# Patient Record
Sex: Female | Born: 1975 | Race: White | Hispanic: No | Smoking: Current every day smoker
Health system: Southern US, Community
[De-identification: ages and names within clinical notes are randomized; demographics above are authoritative.]

## PROBLEM LIST (undated history)

## (undated) DIAGNOSIS — M549 Dorsalgia, unspecified: Secondary | ICD-10-CM

## (undated) DIAGNOSIS — F329 Major depressive disorder, single episode, unspecified: Secondary | ICD-10-CM

## (undated) DIAGNOSIS — G8929 Other chronic pain: Secondary | ICD-10-CM

## (undated) DIAGNOSIS — R519 Headache, unspecified: Secondary | ICD-10-CM

## (undated) DIAGNOSIS — K08109 Complete loss of teeth, unspecified cause, unspecified class: Secondary | ICD-10-CM

## (undated) DIAGNOSIS — Z972 Presence of dental prosthetic device (complete) (partial): Secondary | ICD-10-CM

## (undated) DIAGNOSIS — Z8489 Family history of other specified conditions: Secondary | ICD-10-CM

## (undated) DIAGNOSIS — D649 Anemia, unspecified: Secondary | ICD-10-CM

## (undated) DIAGNOSIS — J449 Chronic obstructive pulmonary disease, unspecified: Secondary | ICD-10-CM

## (undated) DIAGNOSIS — Z9889 Other specified postprocedural states: Secondary | ICD-10-CM

## (undated) DIAGNOSIS — F411 Generalized anxiety disorder: Secondary | ICD-10-CM

## (undated) DIAGNOSIS — F988 Other specified behavioral and emotional disorders with onset usually occurring in childhood and adolescence: Secondary | ICD-10-CM

## (undated) DIAGNOSIS — R112 Nausea with vomiting, unspecified: Secondary | ICD-10-CM

## (undated) DIAGNOSIS — F419 Anxiety disorder, unspecified: Secondary | ICD-10-CM

## (undated) DIAGNOSIS — R51 Headache: Secondary | ICD-10-CM

## (undated) HISTORY — DX: Headache: R51

## (undated) HISTORY — PX: ABDOMINAL HYSTERECTOMY: SHX81

## (undated) HISTORY — DX: Major depressive disorder, single episode, unspecified: F32.9

## (undated) HISTORY — DX: Other chronic pain: G89.29

## (undated) HISTORY — PX: LAPAROSCOPIC ASSISTED VAGINAL HYSTERECTOMY: SHX5398

## (undated) HISTORY — DX: Anxiety disorder, unspecified: F41.9

## (undated) HISTORY — DX: Dorsalgia, unspecified: M54.9

## (undated) HISTORY — DX: Other specified behavioral and emotional disorders with onset usually occurring in childhood and adolescence: F98.8

## (undated) HISTORY — DX: Anemia, unspecified: D64.9

## (undated) HISTORY — DX: Headache, unspecified: R51.9

---

## 1998-04-26 ENCOUNTER — Emergency Department (HOSPITAL_COMMUNITY): Admission: EM | Admit: 1998-04-26 | Discharge: 1998-04-26 | Payer: Self-pay | Admitting: Emergency Medicine

## 2000-06-23 ENCOUNTER — Emergency Department (HOSPITAL_COMMUNITY): Admission: EM | Admit: 2000-06-23 | Discharge: 2000-06-23 | Payer: Self-pay | Admitting: Internal Medicine

## 2000-08-22 ENCOUNTER — Emergency Department (HOSPITAL_COMMUNITY): Admission: EM | Admit: 2000-08-22 | Discharge: 2000-08-22 | Payer: Self-pay | Admitting: Emergency Medicine

## 2000-09-16 ENCOUNTER — Emergency Department (HOSPITAL_COMMUNITY): Admission: EM | Admit: 2000-09-16 | Discharge: 2000-09-16 | Payer: Self-pay | Admitting: Emergency Medicine

## 2000-10-13 ENCOUNTER — Observation Stay (HOSPITAL_COMMUNITY): Admission: AD | Admit: 2000-10-13 | Discharge: 2000-10-13 | Payer: Self-pay | Admitting: Obstetrics and Gynecology

## 2000-10-22 ENCOUNTER — Encounter: Payer: Self-pay | Admitting: Obstetrics and Gynecology

## 2000-10-22 ENCOUNTER — Inpatient Hospital Stay (HOSPITAL_COMMUNITY): Admission: AD | Admit: 2000-10-22 | Discharge: 2000-10-22 | Payer: Self-pay | Admitting: Obstetrics and Gynecology

## 2000-12-01 ENCOUNTER — Other Ambulatory Visit: Admission: RE | Admit: 2000-12-01 | Discharge: 2000-12-01 | Payer: Self-pay | Admitting: Obstetrics and Gynecology

## 2000-12-26 ENCOUNTER — Encounter: Payer: Self-pay | Admitting: Obstetrics and Gynecology

## 2000-12-26 ENCOUNTER — Ambulatory Visit (HOSPITAL_COMMUNITY): Admission: RE | Admit: 2000-12-26 | Discharge: 2000-12-26 | Payer: Self-pay | Admitting: Obstetrics and Gynecology

## 2001-01-15 ENCOUNTER — Inpatient Hospital Stay (HOSPITAL_COMMUNITY): Admission: AD | Admit: 2001-01-15 | Discharge: 2001-01-15 | Payer: Self-pay | Admitting: Obstetrics and Gynecology

## 2001-02-19 ENCOUNTER — Ambulatory Visit (HOSPITAL_COMMUNITY): Admission: RE | Admit: 2001-02-19 | Discharge: 2001-02-19 | Payer: Self-pay | Admitting: Obstetrics and Gynecology

## 2001-03-02 ENCOUNTER — Inpatient Hospital Stay (HOSPITAL_COMMUNITY): Admission: AD | Admit: 2001-03-02 | Discharge: 2001-03-02 | Payer: Self-pay | Admitting: Obstetrics and Gynecology

## 2001-03-08 ENCOUNTER — Inpatient Hospital Stay (HOSPITAL_COMMUNITY): Admission: AD | Admit: 2001-03-08 | Discharge: 2001-03-08 | Payer: Self-pay | Admitting: Obstetrics and Gynecology

## 2001-04-02 ENCOUNTER — Inpatient Hospital Stay (HOSPITAL_COMMUNITY): Admission: AD | Admit: 2001-04-02 | Discharge: 2001-04-02 | Payer: Self-pay | Admitting: Obstetrics and Gynecology

## 2001-04-09 ENCOUNTER — Inpatient Hospital Stay (HOSPITAL_COMMUNITY): Admission: AD | Admit: 2001-04-09 | Discharge: 2001-04-09 | Payer: Self-pay | Admitting: Obstetrics and Gynecology

## 2001-04-21 ENCOUNTER — Inpatient Hospital Stay (HOSPITAL_COMMUNITY): Admission: AD | Admit: 2001-04-21 | Discharge: 2001-04-21 | Payer: Self-pay | Admitting: Obstetrics and Gynecology

## 2001-05-01 ENCOUNTER — Inpatient Hospital Stay (HOSPITAL_COMMUNITY): Admission: AD | Admit: 2001-05-01 | Discharge: 2001-05-01 | Payer: Self-pay | Admitting: Obstetrics and Gynecology

## 2001-05-03 ENCOUNTER — Inpatient Hospital Stay (HOSPITAL_COMMUNITY): Admission: AD | Admit: 2001-05-03 | Discharge: 2001-05-05 | Payer: Self-pay | Admitting: Obstetrics and Gynecology

## 2001-12-07 ENCOUNTER — Other Ambulatory Visit: Admission: RE | Admit: 2001-12-07 | Discharge: 2001-12-07 | Payer: Self-pay | Admitting: Obstetrics and Gynecology

## 2002-05-05 ENCOUNTER — Ambulatory Visit (HOSPITAL_COMMUNITY): Admission: RE | Admit: 2002-05-05 | Discharge: 2002-05-05 | Payer: Self-pay | Admitting: Obstetrics and Gynecology

## 2002-05-05 ENCOUNTER — Encounter: Payer: Self-pay | Admitting: Obstetrics and Gynecology

## 2002-06-23 ENCOUNTER — Encounter: Payer: Self-pay | Admitting: *Deleted

## 2002-06-23 ENCOUNTER — Encounter: Admission: RE | Admit: 2002-06-23 | Discharge: 2002-06-23 | Payer: Self-pay | Admitting: *Deleted

## 2002-10-14 HISTORY — PX: HEMORRHOID SURGERY: SHX153

## 2002-12-23 ENCOUNTER — Other Ambulatory Visit: Admission: RE | Admit: 2002-12-23 | Discharge: 2002-12-23 | Payer: Self-pay | Admitting: Obstetrics and Gynecology

## 2003-05-10 ENCOUNTER — Emergency Department (HOSPITAL_COMMUNITY): Admission: EM | Admit: 2003-05-10 | Discharge: 2003-05-10 | Payer: Self-pay | Admitting: Emergency Medicine

## 2003-05-10 ENCOUNTER — Encounter: Payer: Self-pay | Admitting: Emergency Medicine

## 2003-05-24 ENCOUNTER — Ambulatory Visit (HOSPITAL_COMMUNITY): Admission: RE | Admit: 2003-05-24 | Discharge: 2003-05-24 | Payer: Self-pay | Admitting: Gastroenterology

## 2003-05-24 ENCOUNTER — Encounter (INDEPENDENT_AMBULATORY_CARE_PROVIDER_SITE_OTHER): Payer: Self-pay | Admitting: *Deleted

## 2003-10-15 HISTORY — PX: KNEE ARTHROSCOPY W/ MENISCECTOMY: SHX1879

## 2004-02-15 ENCOUNTER — Other Ambulatory Visit: Admission: RE | Admit: 2004-02-15 | Discharge: 2004-02-15 | Payer: Self-pay | Admitting: Obstetrics and Gynecology

## 2004-06-12 ENCOUNTER — Encounter: Admission: RE | Admit: 2004-06-12 | Discharge: 2004-06-12 | Payer: Self-pay | Admitting: Family Medicine

## 2004-06-22 ENCOUNTER — Inpatient Hospital Stay (HOSPITAL_COMMUNITY): Admission: AD | Admit: 2004-06-22 | Discharge: 2004-06-22 | Payer: Self-pay | Admitting: Obstetrics & Gynecology

## 2004-08-20 ENCOUNTER — Ambulatory Visit: Payer: Self-pay | Admitting: Family Medicine

## 2004-08-23 ENCOUNTER — Ambulatory Visit: Payer: Self-pay | Admitting: Internal Medicine

## 2004-09-11 ENCOUNTER — Ambulatory Visit: Payer: Self-pay | Admitting: Family Medicine

## 2004-12-28 ENCOUNTER — Encounter: Admission: RE | Admit: 2004-12-28 | Discharge: 2004-12-28 | Payer: Self-pay | Admitting: Orthopedic Surgery

## 2004-12-30 ENCOUNTER — Ambulatory Visit (HOSPITAL_COMMUNITY): Admission: RE | Admit: 2004-12-30 | Discharge: 2004-12-30 | Payer: Self-pay | Admitting: Radiology

## 2005-01-29 ENCOUNTER — Ambulatory Visit: Payer: Self-pay | Admitting: Family Medicine

## 2005-07-16 ENCOUNTER — Ambulatory Visit: Payer: Self-pay | Admitting: Family Medicine

## 2005-08-19 ENCOUNTER — Ambulatory Visit: Payer: Self-pay | Admitting: Family Medicine

## 2005-08-22 ENCOUNTER — Emergency Department (HOSPITAL_COMMUNITY): Admission: EM | Admit: 2005-08-22 | Discharge: 2005-08-22 | Payer: Self-pay | Admitting: Emergency Medicine

## 2005-09-02 ENCOUNTER — Emergency Department (HOSPITAL_COMMUNITY): Admission: EM | Admit: 2005-09-02 | Discharge: 2005-09-02 | Payer: Self-pay | Admitting: Emergency Medicine

## 2005-11-22 ENCOUNTER — Ambulatory Visit: Payer: Self-pay | Admitting: Internal Medicine

## 2005-12-10 ENCOUNTER — Other Ambulatory Visit: Admission: RE | Admit: 2005-12-10 | Discharge: 2005-12-10 | Payer: Self-pay | Admitting: Obstetrics and Gynecology

## 2006-01-16 ENCOUNTER — Ambulatory Visit: Payer: Self-pay | Admitting: Family Medicine

## 2006-02-27 ENCOUNTER — Ambulatory Visit (HOSPITAL_COMMUNITY): Admission: RE | Admit: 2006-02-27 | Discharge: 2006-02-27 | Payer: Self-pay | Admitting: Surgery

## 2006-02-27 ENCOUNTER — Encounter (INDEPENDENT_AMBULATORY_CARE_PROVIDER_SITE_OTHER): Payer: Self-pay | Admitting: Specialist

## 2006-02-28 ENCOUNTER — Encounter: Payer: Self-pay | Admitting: Physician Assistant

## 2006-02-28 HISTORY — PX: HEMORRHOID SURGERY: SHX153

## 2006-03-11 ENCOUNTER — Ambulatory Visit: Payer: Self-pay | Admitting: Family Medicine

## 2006-03-14 ENCOUNTER — Ambulatory Visit: Payer: Self-pay | Admitting: Family Medicine

## 2006-03-25 ENCOUNTER — Encounter: Admission: RE | Admit: 2006-03-25 | Discharge: 2006-03-25 | Payer: Self-pay | Admitting: Surgery

## 2006-04-03 ENCOUNTER — Ambulatory Visit: Payer: Self-pay | Admitting: Family Medicine

## 2006-04-04 ENCOUNTER — Ambulatory Visit: Payer: Self-pay | Admitting: Family Medicine

## 2006-04-07 ENCOUNTER — Inpatient Hospital Stay (HOSPITAL_COMMUNITY): Admission: AD | Admit: 2006-04-07 | Discharge: 2006-04-07 | Payer: Self-pay | Admitting: Obstetrics and Gynecology

## 2006-04-28 ENCOUNTER — Ambulatory Visit: Payer: Self-pay | Admitting: Family Medicine

## 2006-05-21 ENCOUNTER — Ambulatory Visit: Payer: Self-pay | Admitting: Family Medicine

## 2006-05-31 ENCOUNTER — Ambulatory Visit: Payer: Self-pay | Admitting: Internal Medicine

## 2006-06-01 ENCOUNTER — Inpatient Hospital Stay (HOSPITAL_COMMUNITY): Admission: EM | Admit: 2006-06-01 | Discharge: 2006-06-03 | Payer: Self-pay | Admitting: Emergency Medicine

## 2006-06-12 ENCOUNTER — Emergency Department (HOSPITAL_COMMUNITY): Admission: EM | Admit: 2006-06-12 | Discharge: 2006-06-12 | Payer: Self-pay | Admitting: Emergency Medicine

## 2006-07-01 ENCOUNTER — Ambulatory Visit: Payer: Self-pay | Admitting: Family Medicine

## 2006-07-30 ENCOUNTER — Ambulatory Visit: Payer: Self-pay | Admitting: Family Medicine

## 2006-08-14 ENCOUNTER — Inpatient Hospital Stay (HOSPITAL_COMMUNITY): Admission: AD | Admit: 2006-08-14 | Discharge: 2006-08-18 | Payer: Self-pay | Admitting: Psychiatry

## 2006-08-14 ENCOUNTER — Ambulatory Visit: Payer: Self-pay | Admitting: Psychiatry

## 2006-08-24 ENCOUNTER — Inpatient Hospital Stay (HOSPITAL_COMMUNITY): Admission: AD | Admit: 2006-08-24 | Discharge: 2006-08-24 | Payer: Self-pay | Admitting: Obstetrics and Gynecology

## 2006-08-25 ENCOUNTER — Ambulatory Visit: Payer: Self-pay | Admitting: Family Medicine

## 2006-08-26 ENCOUNTER — Emergency Department (HOSPITAL_COMMUNITY): Admission: EM | Admit: 2006-08-26 | Discharge: 2006-08-27 | Payer: Self-pay | Admitting: Emergency Medicine

## 2006-09-11 ENCOUNTER — Emergency Department (HOSPITAL_COMMUNITY): Admission: EM | Admit: 2006-09-11 | Discharge: 2006-09-11 | Payer: Self-pay | Admitting: Emergency Medicine

## 2006-10-02 ENCOUNTER — Ambulatory Visit: Payer: Self-pay | Admitting: Family Medicine

## 2006-10-08 ENCOUNTER — Ambulatory Visit: Payer: Self-pay | Admitting: Family Medicine

## 2006-10-17 ENCOUNTER — Ambulatory Visit: Payer: Self-pay | Admitting: Family Medicine

## 2006-10-17 ENCOUNTER — Emergency Department (HOSPITAL_COMMUNITY): Admission: EM | Admit: 2006-10-17 | Discharge: 2006-10-18 | Payer: Self-pay | Admitting: Emergency Medicine

## 2006-11-05 ENCOUNTER — Ambulatory Visit: Payer: Self-pay | Admitting: Family Medicine

## 2006-12-04 ENCOUNTER — Emergency Department (HOSPITAL_COMMUNITY): Admission: EM | Admit: 2006-12-04 | Discharge: 2006-12-04 | Payer: Self-pay | Admitting: Emergency Medicine

## 2006-12-05 ENCOUNTER — Emergency Department (HOSPITAL_COMMUNITY): Admission: EM | Admit: 2006-12-05 | Discharge: 2006-12-05 | Payer: Self-pay | Admitting: *Deleted

## 2006-12-15 ENCOUNTER — Ambulatory Visit: Payer: Self-pay | Admitting: Family Medicine

## 2006-12-25 ENCOUNTER — Emergency Department (HOSPITAL_COMMUNITY): Admission: EM | Admit: 2006-12-25 | Discharge: 2006-12-25 | Payer: Self-pay | Admitting: Emergency Medicine

## 2007-01-18 ENCOUNTER — Emergency Department (HOSPITAL_COMMUNITY): Admission: EM | Admit: 2007-01-18 | Discharge: 2007-01-18 | Payer: Self-pay | Admitting: Emergency Medicine

## 2007-01-21 ENCOUNTER — Ambulatory Visit: Payer: Self-pay | Admitting: Family Medicine

## 2007-01-29 ENCOUNTER — Emergency Department (HOSPITAL_COMMUNITY): Admission: EM | Admit: 2007-01-29 | Discharge: 2007-01-29 | Payer: Self-pay | Admitting: Emergency Medicine

## 2007-02-05 ENCOUNTER — Ambulatory Visit: Payer: Self-pay | Admitting: Family Medicine

## 2007-03-20 ENCOUNTER — Ambulatory Visit: Payer: Self-pay | Admitting: Family Medicine

## 2007-04-06 DIAGNOSIS — Z8719 Personal history of other diseases of the digestive system: Secondary | ICD-10-CM

## 2007-04-06 DIAGNOSIS — F329 Major depressive disorder, single episode, unspecified: Secondary | ICD-10-CM

## 2007-04-06 DIAGNOSIS — D649 Anemia, unspecified: Secondary | ICD-10-CM | POA: Insufficient documentation

## 2007-04-06 DIAGNOSIS — N390 Urinary tract infection, site not specified: Secondary | ICD-10-CM

## 2007-04-06 DIAGNOSIS — R519 Headache, unspecified: Secondary | ICD-10-CM | POA: Insufficient documentation

## 2007-04-06 DIAGNOSIS — R51 Headache: Secondary | ICD-10-CM | POA: Insufficient documentation

## 2007-04-06 DIAGNOSIS — F988 Other specified behavioral and emotional disorders with onset usually occurring in childhood and adolescence: Secondary | ICD-10-CM | POA: Insufficient documentation

## 2007-04-15 ENCOUNTER — Ambulatory Visit: Payer: Self-pay | Admitting: Family Medicine

## 2007-04-15 LAB — CONVERTED CEMR LAB
BUN: 6 mg/dL (ref 6–23)
Basophils Relative: 0.6 % (ref 0.0–1.0)
CO2: 32 meq/L (ref 19–32)
Creatinine, Ser: 0.5 mg/dL (ref 0.4–1.2)
Eosinophils Relative: 1.8 % (ref 0.0–5.0)
GFR calc Af Amer: 189 mL/min
Glucose, Bld: 96 mg/dL (ref 70–99)
HCT: 38.5 % (ref 36.0–46.0)
Hemoglobin: 13 g/dL (ref 12.0–15.0)
Lymphocytes Relative: 32.7 % (ref 12.0–46.0)
Monocytes Absolute: 0.6 10*3/uL (ref 0.2–0.7)
Neutro Abs: 4.5 10*3/uL (ref 1.4–7.7)
Neutrophils Relative %: 57.7 % (ref 43.0–77.0)
Potassium: 4.1 meq/L (ref 3.5–5.1)
RDW: 13.5 % (ref 11.5–14.6)
Sodium: 141 meq/L (ref 135–145)
WBC: 7.7 10*3/uL (ref 4.5–10.5)
hCG, Beta Chain, Quant, S: <0.5 milliintl units/mL

## 2007-04-16 ENCOUNTER — Telehealth: Payer: Self-pay | Admitting: *Deleted

## 2007-05-28 ENCOUNTER — Ambulatory Visit: Payer: Self-pay | Admitting: Internal Medicine

## 2007-06-19 ENCOUNTER — Ambulatory Visit: Payer: Self-pay | Admitting: Family Medicine

## 2007-06-29 ENCOUNTER — Telehealth: Payer: Self-pay | Admitting: Family Medicine

## 2007-07-27 ENCOUNTER — Ambulatory Visit: Payer: Self-pay | Admitting: Family Medicine

## 2007-07-27 DIAGNOSIS — J209 Acute bronchitis, unspecified: Secondary | ICD-10-CM | POA: Insufficient documentation

## 2007-07-28 ENCOUNTER — Telehealth: Payer: Self-pay | Admitting: Family Medicine

## 2007-08-04 ENCOUNTER — Ambulatory Visit: Payer: Self-pay | Admitting: Family Medicine

## 2007-08-12 ENCOUNTER — Telehealth: Payer: Self-pay | Admitting: Family Medicine

## 2007-08-14 ENCOUNTER — Ambulatory Visit: Payer: Self-pay | Admitting: Family Medicine

## 2007-08-14 DIAGNOSIS — F411 Generalized anxiety disorder: Secondary | ICD-10-CM | POA: Insufficient documentation

## 2007-08-21 ENCOUNTER — Telehealth: Payer: Self-pay | Admitting: Family Medicine

## 2007-09-04 ENCOUNTER — Telehealth: Payer: Self-pay | Admitting: Family Medicine

## 2007-09-07 ENCOUNTER — Telehealth: Payer: Self-pay | Admitting: Family Medicine

## 2007-09-16 ENCOUNTER — Telehealth: Payer: Self-pay | Admitting: Family Medicine

## 2007-09-18 ENCOUNTER — Ambulatory Visit: Payer: Self-pay | Admitting: Family Medicine

## 2007-09-23 ENCOUNTER — Telehealth: Payer: Self-pay | Admitting: Family Medicine

## 2007-10-01 ENCOUNTER — Telehealth: Payer: Self-pay | Admitting: Family Medicine

## 2007-11-05 ENCOUNTER — Ambulatory Visit: Payer: Self-pay | Admitting: Family Medicine

## 2007-11-20 ENCOUNTER — Telehealth: Payer: Self-pay | Admitting: Family Medicine

## 2007-12-01 ENCOUNTER — Telehealth: Payer: Self-pay | Admitting: Family Medicine

## 2007-12-18 ENCOUNTER — Ambulatory Visit: Payer: Self-pay | Admitting: Family Medicine

## 2007-12-30 ENCOUNTER — Telehealth: Payer: Self-pay | Admitting: Family Medicine

## 2008-01-15 ENCOUNTER — Ambulatory Visit: Payer: Self-pay | Admitting: Family Medicine

## 2008-02-05 ENCOUNTER — Telehealth: Payer: Self-pay | Admitting: Family Medicine

## 2008-02-24 ENCOUNTER — Ambulatory Visit: Payer: Self-pay | Admitting: Family Medicine

## 2008-02-24 DIAGNOSIS — R188 Other ascites: Secondary | ICD-10-CM | POA: Insufficient documentation

## 2008-02-24 DIAGNOSIS — R1084 Generalized abdominal pain: Secondary | ICD-10-CM | POA: Insufficient documentation

## 2008-02-24 LAB — CONVERTED CEMR LAB
Blood in Urine, dipstick: NEGATIVE
Ketones, urine, test strip: NEGATIVE
Nitrite: NEGATIVE
Protein, U semiquant: NEGATIVE
Specific Gravity, Urine: >=1.03

## 2008-02-29 LAB — CONVERTED CEMR LAB
ALT: 12 units/L (ref 0–35)
Basophils Absolute: 0.1 10*3/uL (ref 0.0–0.1)
Basophils Relative: 1 % (ref 0.0–1.0)
CO2: 27 meq/L (ref 19–32)
Calcium: 8.6 mg/dL (ref 8.4–10.5)
Cortisol, Plasma: 1.9 ug/dL
Creatinine, Ser: 0.7 mg/dL (ref 0.4–1.2)
Glucose, Bld: 92 mg/dL (ref 70–99)
HCT: 35.3 % — ABNORMAL LOW (ref 36.0–46.0)
Hemoglobin: 11.9 g/dL — ABNORMAL LOW (ref 12.0–15.0)
Lymphocytes Relative: 33.9 % (ref 12.0–46.0)
MCHC: 33.8 g/dL (ref 30.0–36.0)
Monocytes Absolute: 0.3 10*3/uL (ref 0.1–1.0)
Monocytes Relative: 3.5 % (ref 3.0–12.0)
Neutro Abs: 4.9 10*3/uL (ref 1.4–7.7)
RBC: 4.13 M/uL (ref 3.87–5.11)
TSH: 1.6 microintl units/mL (ref 0.35–5.50)
Total Protein: 6.2 g/dL (ref 6.0–8.3)

## 2008-03-02 ENCOUNTER — Telehealth: Payer: Self-pay | Admitting: Family Medicine

## 2008-03-09 ENCOUNTER — Emergency Department (HOSPITAL_COMMUNITY): Admission: EM | Admit: 2008-03-09 | Discharge: 2008-03-09 | Payer: Self-pay | Admitting: Emergency Medicine

## 2008-03-14 ENCOUNTER — Encounter: Payer: Self-pay | Admitting: Family Medicine

## 2008-03-23 ENCOUNTER — Telehealth: Payer: Self-pay | Admitting: Family Medicine

## 2008-03-23 ENCOUNTER — Ambulatory Visit: Payer: Self-pay | Admitting: Family Medicine

## 2008-04-08 ENCOUNTER — Telehealth: Payer: Self-pay | Admitting: Family Medicine

## 2008-04-11 ENCOUNTER — Telehealth: Payer: Self-pay | Admitting: Family Medicine

## 2008-04-13 ENCOUNTER — Telehealth: Payer: Self-pay | Admitting: Family Medicine

## 2008-04-22 ENCOUNTER — Telehealth: Payer: Self-pay | Admitting: Family Medicine

## 2008-05-16 ENCOUNTER — Ambulatory Visit: Payer: Self-pay | Admitting: Family Medicine

## 2008-05-20 ENCOUNTER — Telehealth: Payer: Self-pay | Admitting: *Deleted

## 2008-06-03 ENCOUNTER — Telehealth: Payer: Self-pay | Admitting: Family Medicine

## 2008-06-13 ENCOUNTER — Telehealth: Payer: Self-pay | Admitting: Family Medicine

## 2008-06-24 ENCOUNTER — Ambulatory Visit: Payer: Self-pay | Admitting: Family Medicine

## 2008-06-27 ENCOUNTER — Telehealth: Payer: Self-pay | Admitting: Family Medicine

## 2008-07-05 ENCOUNTER — Inpatient Hospital Stay (HOSPITAL_COMMUNITY): Admission: AD | Admit: 2008-07-05 | Discharge: 2008-07-05 | Payer: Self-pay | Admitting: Obstetrics & Gynecology

## 2008-07-08 ENCOUNTER — Telehealth: Payer: Self-pay | Admitting: Family Medicine

## 2008-07-19 ENCOUNTER — Ambulatory Visit: Payer: Self-pay | Admitting: Family Medicine

## 2008-07-21 ENCOUNTER — Telehealth: Payer: Self-pay | Admitting: Family Medicine

## 2008-07-21 LAB — CONVERTED CEMR LAB
Alkaline Phosphatase: 70 units/L (ref 39–117)
Bilirubin, Direct: 0.1 mg/dL (ref 0.0–0.3)
CO2: 29 meq/L (ref 19–32)
GFR calc Af Amer: 151 mL/min
Glucose, Bld: 93 mg/dL (ref 70–99)
Lymphocytes Relative: 19.8 % (ref 12.0–46.0)
Monocytes Absolute: 0.6 10*3/uL (ref 0.1–1.0)
Monocytes Relative: 5.8 % (ref 3.0–12.0)
Neutrophils Relative %: 73.3 % (ref 43.0–77.0)
Platelets: 271 10*3/uL (ref 150–400)
Potassium: 3.7 meq/L (ref 3.5–5.1)
RDW: 14.6 % (ref 11.5–14.6)
Sodium: 141 meq/L (ref 135–145)
Total Protein: 7.1 g/dL (ref 6.0–8.3)

## 2008-08-29 ENCOUNTER — Ambulatory Visit: Payer: Self-pay | Admitting: Family Medicine

## 2008-09-12 ENCOUNTER — Inpatient Hospital Stay (HOSPITAL_COMMUNITY): Admission: AD | Admit: 2008-09-12 | Discharge: 2008-09-12 | Payer: Self-pay | Admitting: Obstetrics & Gynecology

## 2008-09-23 ENCOUNTER — Telehealth: Payer: Self-pay | Admitting: Family Medicine

## 2008-10-17 ENCOUNTER — Telehealth: Payer: Self-pay | Admitting: Family Medicine

## 2008-10-18 ENCOUNTER — Ambulatory Visit: Payer: Self-pay | Admitting: Family Medicine

## 2008-10-18 ENCOUNTER — Telehealth: Payer: Self-pay | Admitting: Family Medicine

## 2008-10-18 ENCOUNTER — Inpatient Hospital Stay (HOSPITAL_COMMUNITY): Admission: AD | Admit: 2008-10-18 | Discharge: 2008-10-18 | Payer: Self-pay | Admitting: Obstetrics & Gynecology

## 2008-10-28 ENCOUNTER — Inpatient Hospital Stay (HOSPITAL_COMMUNITY): Admission: AD | Admit: 2008-10-28 | Discharge: 2008-10-28 | Payer: Self-pay | Admitting: Obstetrics & Gynecology

## 2008-10-28 ENCOUNTER — Ambulatory Visit: Payer: Self-pay | Admitting: Physician Assistant

## 2008-11-15 ENCOUNTER — Ambulatory Visit: Payer: Self-pay | Admitting: Family Medicine

## 2008-11-15 DIAGNOSIS — S139XXA Sprain of joints and ligaments of unspecified parts of neck, initial encounter: Secondary | ICD-10-CM | POA: Insufficient documentation

## 2008-11-30 ENCOUNTER — Ambulatory Visit: Payer: Self-pay | Admitting: Family Medicine

## 2008-12-24 ENCOUNTER — Inpatient Hospital Stay (HOSPITAL_COMMUNITY): Admission: AD | Admit: 2008-12-24 | Discharge: 2008-12-24 | Payer: Self-pay | Admitting: Obstetrics and Gynecology

## 2008-12-29 ENCOUNTER — Telehealth: Payer: Self-pay | Admitting: Family Medicine

## 2009-01-10 ENCOUNTER — Inpatient Hospital Stay (HOSPITAL_COMMUNITY): Admission: AD | Admit: 2009-01-10 | Discharge: 2009-01-10 | Payer: Self-pay | Admitting: Obstetrics and Gynecology

## 2009-01-11 ENCOUNTER — Telehealth: Payer: Self-pay | Admitting: Family Medicine

## 2009-02-01 ENCOUNTER — Ambulatory Visit: Payer: Self-pay | Admitting: Family Medicine

## 2009-02-04 ENCOUNTER — Inpatient Hospital Stay (HOSPITAL_COMMUNITY): Admission: AD | Admit: 2009-02-04 | Discharge: 2009-02-04 | Payer: Self-pay | Admitting: Obstetrics and Gynecology

## 2009-03-01 ENCOUNTER — Telehealth: Payer: Self-pay | Admitting: Family Medicine

## 2009-03-02 ENCOUNTER — Telehealth: Payer: Self-pay | Admitting: Family Medicine

## 2009-03-02 ENCOUNTER — Inpatient Hospital Stay (HOSPITAL_COMMUNITY): Admission: AD | Admit: 2009-03-02 | Discharge: 2009-03-03 | Payer: Self-pay | Admitting: Obstetrics and Gynecology

## 2009-03-16 ENCOUNTER — Inpatient Hospital Stay (HOSPITAL_COMMUNITY): Admission: AD | Admit: 2009-03-16 | Discharge: 2009-03-16 | Payer: Self-pay | Admitting: Obstetrics and Gynecology

## 2009-03-19 ENCOUNTER — Inpatient Hospital Stay (HOSPITAL_COMMUNITY): Admission: AD | Admit: 2009-03-19 | Discharge: 2009-03-19 | Payer: Self-pay | Admitting: Obstetrics and Gynecology

## 2009-03-28 ENCOUNTER — Telehealth: Payer: Self-pay | Admitting: Family Medicine

## 2009-03-31 ENCOUNTER — Inpatient Hospital Stay (HOSPITAL_COMMUNITY): Admission: AD | Admit: 2009-03-31 | Discharge: 2009-04-01 | Payer: Self-pay | Admitting: Obstetrics and Gynecology

## 2009-04-01 ENCOUNTER — Inpatient Hospital Stay (HOSPITAL_COMMUNITY): Admission: AD | Admit: 2009-04-01 | Discharge: 2009-04-03 | Payer: Self-pay | Admitting: Obstetrics and Gynecology

## 2009-04-14 ENCOUNTER — Ambulatory Visit: Payer: Self-pay | Admitting: Family Medicine

## 2009-04-27 ENCOUNTER — Telehealth: Payer: Self-pay | Admitting: Family Medicine

## 2009-05-04 ENCOUNTER — Telehealth: Payer: Self-pay | Admitting: Family Medicine

## 2009-05-24 ENCOUNTER — Telehealth: Payer: Self-pay | Admitting: Family Medicine

## 2009-05-26 ENCOUNTER — Ambulatory Visit: Payer: Self-pay | Admitting: Family Medicine

## 2009-05-26 DIAGNOSIS — M26629 Arthralgia of temporomandibular joint, unspecified side: Secondary | ICD-10-CM | POA: Insufficient documentation

## 2009-05-30 ENCOUNTER — Telehealth (INDEPENDENT_AMBULATORY_CARE_PROVIDER_SITE_OTHER): Payer: Self-pay | Admitting: *Deleted

## 2009-06-28 ENCOUNTER — Emergency Department (HOSPITAL_BASED_OUTPATIENT_CLINIC_OR_DEPARTMENT_OTHER): Admission: EM | Admit: 2009-06-28 | Discharge: 2009-06-28 | Payer: Self-pay | Admitting: Emergency Medicine

## 2009-07-04 ENCOUNTER — Telehealth: Payer: Self-pay | Admitting: Family Medicine

## 2009-07-05 ENCOUNTER — Telehealth: Payer: Self-pay | Admitting: Family Medicine

## 2009-07-05 ENCOUNTER — Emergency Department (HOSPITAL_BASED_OUTPATIENT_CLINIC_OR_DEPARTMENT_OTHER): Admission: EM | Admit: 2009-07-05 | Discharge: 2009-07-05 | Payer: Self-pay | Admitting: Emergency Medicine

## 2009-07-08 ENCOUNTER — Emergency Department (HOSPITAL_BASED_OUTPATIENT_CLINIC_OR_DEPARTMENT_OTHER): Admission: EM | Admit: 2009-07-08 | Discharge: 2009-07-08 | Payer: Self-pay | Admitting: Emergency Medicine

## 2009-07-21 ENCOUNTER — Ambulatory Visit: Payer: Self-pay | Admitting: Family Medicine

## 2009-08-28 ENCOUNTER — Telehealth: Payer: Self-pay | Admitting: Family Medicine

## 2009-09-15 ENCOUNTER — Telehealth: Payer: Self-pay | Admitting: Family Medicine

## 2009-10-10 ENCOUNTER — Telehealth: Payer: Self-pay | Admitting: Family Medicine

## 2009-10-11 ENCOUNTER — Telehealth: Payer: Self-pay | Admitting: Family Medicine

## 2009-10-18 ENCOUNTER — Encounter: Payer: Self-pay | Admitting: Physician Assistant

## 2009-10-18 ENCOUNTER — Telehealth: Payer: Self-pay | Admitting: Family Medicine

## 2009-10-18 ENCOUNTER — Encounter (INDEPENDENT_AMBULATORY_CARE_PROVIDER_SITE_OTHER): Payer: Self-pay | Admitting: *Deleted

## 2009-10-18 ENCOUNTER — Emergency Department (HOSPITAL_COMMUNITY): Admission: EM | Admit: 2009-10-18 | Discharge: 2009-10-18 | Payer: Self-pay | Admitting: Emergency Medicine

## 2009-10-24 ENCOUNTER — Telehealth (INDEPENDENT_AMBULATORY_CARE_PROVIDER_SITE_OTHER): Payer: Self-pay | Admitting: *Deleted

## 2009-10-25 ENCOUNTER — Encounter (INDEPENDENT_AMBULATORY_CARE_PROVIDER_SITE_OTHER): Payer: Self-pay | Admitting: *Deleted

## 2009-10-26 ENCOUNTER — Encounter: Payer: Self-pay | Admitting: Physician Assistant

## 2009-10-26 ENCOUNTER — Ambulatory Visit: Payer: Self-pay | Admitting: Gastroenterology

## 2009-10-26 DIAGNOSIS — R1032 Left lower quadrant pain: Secondary | ICD-10-CM | POA: Insufficient documentation

## 2009-10-26 DIAGNOSIS — R197 Diarrhea, unspecified: Secondary | ICD-10-CM | POA: Insufficient documentation

## 2009-10-26 DIAGNOSIS — R933 Abnormal findings on diagnostic imaging of other parts of digestive tract: Secondary | ICD-10-CM | POA: Insufficient documentation

## 2009-10-26 DIAGNOSIS — K625 Hemorrhage of anus and rectum: Secondary | ICD-10-CM | POA: Insufficient documentation

## 2009-10-31 ENCOUNTER — Emergency Department (HOSPITAL_COMMUNITY): Admission: EM | Admit: 2009-10-31 | Discharge: 2009-10-31 | Payer: Self-pay | Admitting: Emergency Medicine

## 2009-11-01 ENCOUNTER — Telehealth: Payer: Self-pay | Admitting: Gastroenterology

## 2009-11-03 ENCOUNTER — Encounter: Payer: Self-pay | Admitting: Gastroenterology

## 2009-11-07 ENCOUNTER — Telehealth: Payer: Self-pay | Admitting: Nurse Practitioner

## 2009-11-13 ENCOUNTER — Telehealth: Payer: Self-pay | Admitting: Gastroenterology

## 2009-11-15 ENCOUNTER — Ambulatory Visit: Payer: Self-pay | Admitting: Gastroenterology

## 2009-11-15 ENCOUNTER — Ambulatory Visit (HOSPITAL_COMMUNITY): Admission: RE | Admit: 2009-11-15 | Discharge: 2009-11-15 | Payer: Self-pay | Admitting: Gastroenterology

## 2009-11-15 HISTORY — PX: COLONOSCOPY: SHX174

## 2009-11-24 ENCOUNTER — Ambulatory Visit: Payer: Self-pay | Admitting: Family Medicine

## 2009-11-24 DIAGNOSIS — M545 Low back pain, unspecified: Secondary | ICD-10-CM | POA: Insufficient documentation

## 2009-11-27 ENCOUNTER — Telehealth: Payer: Self-pay | Admitting: Family Medicine

## 2009-12-04 ENCOUNTER — Encounter: Admission: RE | Admit: 2009-12-04 | Discharge: 2009-12-04 | Payer: Self-pay | Admitting: Family Medicine

## 2009-12-13 ENCOUNTER — Ambulatory Visit: Payer: Self-pay | Admitting: Family Medicine

## 2009-12-24 ENCOUNTER — Emergency Department (HOSPITAL_COMMUNITY): Admission: EM | Admit: 2009-12-24 | Discharge: 2009-12-25 | Payer: Self-pay | Admitting: Emergency Medicine

## 2009-12-25 ENCOUNTER — Encounter: Payer: Self-pay | Admitting: Physician Assistant

## 2010-01-02 ENCOUNTER — Telehealth: Payer: Self-pay | Admitting: Physician Assistant

## 2010-01-03 ENCOUNTER — Ambulatory Visit: Payer: Self-pay | Admitting: Family Medicine

## 2010-01-05 ENCOUNTER — Telehealth: Payer: Self-pay | Admitting: Physician Assistant

## 2010-01-05 ENCOUNTER — Telehealth: Payer: Self-pay | Admitting: Family Medicine

## 2010-01-10 ENCOUNTER — Telehealth: Payer: Self-pay | Admitting: Physician Assistant

## 2010-01-12 ENCOUNTER — Telehealth: Payer: Self-pay | Admitting: Family Medicine

## 2010-01-18 ENCOUNTER — Telehealth: Payer: Self-pay

## 2010-01-26 ENCOUNTER — Ambulatory Visit: Payer: Self-pay | Admitting: Family Medicine

## 2010-02-05 ENCOUNTER — Telehealth: Payer: Self-pay | Admitting: Family Medicine

## 2010-02-14 ENCOUNTER — Telehealth: Payer: Self-pay | Admitting: Family Medicine

## 2010-02-16 ENCOUNTER — Telehealth: Payer: Self-pay | Admitting: Family Medicine

## 2010-02-19 ENCOUNTER — Ambulatory Visit: Payer: Self-pay | Admitting: Family Medicine

## 2010-02-20 LAB — CONVERTED CEMR LAB
BUN: 11 mg/dL (ref 6–23)
Basophils Absolute: 0 10*3/uL (ref 0.0–0.1)
Eosinophils Absolute: 0.2 10*3/uL (ref 0.0–0.7)
GFR calc non Af Amer: 118.8 mL/min (ref >60)
Glucose, Bld: 83 mg/dL (ref 70–99)
HCT: 36.2 % (ref 36.0–46.0)
Lymphs Abs: 2.3 10*3/uL (ref 0.7–4.0)
MCV: 80.5 fL (ref 78.0–100.0)
Monocytes Absolute: 0.7 10*3/uL (ref 0.1–1.0)
Monocytes Relative: 7 % (ref 3.0–12.0)
Platelets: 252 10*3/uL (ref 150.0–400.0)
Potassium: 4.3 meq/L (ref 3.5–5.1)
RDW: 15.4 % — ABNORMAL HIGH (ref 11.5–14.6)
TSH: 2.25 microintl units/mL (ref 0.35–5.50)
Total Bilirubin: 0.4 mg/dL (ref 0.3–1.2)
Vitamin B-12: 443 pg/mL (ref 211–911)

## 2010-02-22 ENCOUNTER — Telehealth: Payer: Self-pay | Admitting: Family Medicine

## 2010-02-23 ENCOUNTER — Ambulatory Visit: Payer: Self-pay | Admitting: Family Medicine

## 2010-02-27 ENCOUNTER — Encounter (INDEPENDENT_AMBULATORY_CARE_PROVIDER_SITE_OTHER): Payer: Self-pay | Admitting: Obstetrics and Gynecology

## 2010-02-27 ENCOUNTER — Ambulatory Visit (HOSPITAL_COMMUNITY): Admission: RE | Admit: 2010-02-27 | Discharge: 2010-02-28 | Payer: Self-pay | Admitting: Obstetrics and Gynecology

## 2010-02-27 ENCOUNTER — Encounter: Payer: Self-pay | Admitting: Family Medicine

## 2010-02-27 HISTORY — PX: LAPAROSCOPIC ASSISTED VAGINAL HYSTERECTOMY: SHX5398

## 2010-03-13 ENCOUNTER — Encounter (INDEPENDENT_AMBULATORY_CARE_PROVIDER_SITE_OTHER): Payer: Self-pay | Admitting: *Deleted

## 2010-03-16 ENCOUNTER — Ambulatory Visit: Payer: Self-pay | Admitting: Family Medicine

## 2010-03-23 ENCOUNTER — Telehealth: Payer: Self-pay | Admitting: Family Medicine

## 2010-03-29 ENCOUNTER — Telehealth: Payer: Self-pay | Admitting: Family Medicine

## 2010-04-04 ENCOUNTER — Ambulatory Visit: Payer: Self-pay | Admitting: Family Medicine

## 2010-04-10 ENCOUNTER — Telehealth: Payer: Self-pay | Admitting: Gastroenterology

## 2010-04-17 ENCOUNTER — Telehealth: Payer: Self-pay | Admitting: Internal Medicine

## 2010-04-20 ENCOUNTER — Encounter (INDEPENDENT_AMBULATORY_CARE_PROVIDER_SITE_OTHER): Payer: Self-pay | Admitting: *Deleted

## 2010-04-23 ENCOUNTER — Telehealth: Payer: Self-pay | Admitting: Family Medicine

## 2010-04-24 ENCOUNTER — Telehealth: Payer: Self-pay | Admitting: Family Medicine

## 2010-04-25 ENCOUNTER — Ambulatory Visit: Payer: Self-pay | Admitting: Family Medicine

## 2010-04-30 ENCOUNTER — Encounter: Payer: Self-pay | Admitting: Family Medicine

## 2010-04-30 DIAGNOSIS — K623 Rectal prolapse: Secondary | ICD-10-CM | POA: Insufficient documentation

## 2010-05-23 ENCOUNTER — Encounter: Payer: Self-pay | Admitting: Family Medicine

## 2010-06-11 ENCOUNTER — Telehealth: Payer: Self-pay | Admitting: Family Medicine

## 2010-06-12 ENCOUNTER — Telehealth: Payer: Self-pay | Admitting: Family Medicine

## 2010-06-13 ENCOUNTER — Telehealth: Payer: Self-pay | Admitting: Family Medicine

## 2010-06-14 ENCOUNTER — Telehealth: Payer: Self-pay | Admitting: Gastroenterology

## 2010-06-15 ENCOUNTER — Emergency Department (HOSPITAL_COMMUNITY)
Admission: EM | Admit: 2010-06-15 | Discharge: 2010-06-15 | Payer: Self-pay | Source: Home / Self Care | Admitting: Emergency Medicine

## 2010-06-15 ENCOUNTER — Ambulatory Visit: Payer: Self-pay | Admitting: Gastroenterology

## 2010-06-15 DIAGNOSIS — R142 Eructation: Secondary | ICD-10-CM

## 2010-06-15 DIAGNOSIS — K589 Irritable bowel syndrome without diarrhea: Secondary | ICD-10-CM

## 2010-06-15 DIAGNOSIS — R141 Gas pain: Secondary | ICD-10-CM | POA: Insufficient documentation

## 2010-06-15 DIAGNOSIS — R110 Nausea: Secondary | ICD-10-CM | POA: Insufficient documentation

## 2010-06-15 DIAGNOSIS — R143 Flatulence: Secondary | ICD-10-CM

## 2010-06-17 ENCOUNTER — Encounter (INDEPENDENT_AMBULATORY_CARE_PROVIDER_SITE_OTHER): Payer: Self-pay | Admitting: *Deleted

## 2010-06-17 ENCOUNTER — Emergency Department (HOSPITAL_COMMUNITY): Admission: EM | Admit: 2010-06-17 | Discharge: 2010-06-18 | Payer: Self-pay | Admitting: *Deleted

## 2010-06-19 ENCOUNTER — Telehealth: Payer: Self-pay | Admitting: Physician Assistant

## 2010-06-19 LAB — CONVERTED CEMR LAB
Basophils Relative: 0.4 % (ref 0.0–3.0)
Hemoglobin: 11 g/dL — ABNORMAL LOW (ref 12.0–15.0)
Lymphocytes Relative: 27.9 % (ref 12.0–46.0)
Monocytes Relative: 5.5 % (ref 3.0–12.0)
Neutro Abs: 7.3 10*3/uL (ref 1.4–7.7)
RBC: 4.34 M/uL (ref 3.87–5.11)
WBC: 11.8 10*3/uL — ABNORMAL HIGH (ref 4.5–10.5)

## 2010-06-20 ENCOUNTER — Telehealth: Payer: Self-pay | Admitting: Physician Assistant

## 2010-06-21 ENCOUNTER — Ambulatory Visit: Payer: Self-pay | Admitting: Gastroenterology

## 2010-06-21 ENCOUNTER — Encounter: Payer: Self-pay | Admitting: Gastroenterology

## 2010-06-21 LAB — CONVERTED CEMR LAB
Anti Nuclear Antibody(ANA): NEGATIVE
Sed Rate: 28 mm/hr — ABNORMAL HIGH (ref 0–22)

## 2010-06-29 ENCOUNTER — Ambulatory Visit: Payer: Self-pay | Admitting: Gastroenterology

## 2010-07-04 ENCOUNTER — Encounter: Payer: Self-pay | Admitting: Gastroenterology

## 2010-07-12 ENCOUNTER — Emergency Department (HOSPITAL_COMMUNITY): Admission: EM | Admit: 2010-07-12 | Discharge: 2010-07-13 | Payer: Self-pay | Admitting: Emergency Medicine

## 2010-07-13 ENCOUNTER — Telehealth: Payer: Self-pay | Admitting: Gastroenterology

## 2010-07-16 ENCOUNTER — Ambulatory Visit: Payer: Self-pay | Admitting: Internal Medicine

## 2010-07-16 DIAGNOSIS — R112 Nausea with vomiting, unspecified: Secondary | ICD-10-CM | POA: Insufficient documentation

## 2010-07-17 ENCOUNTER — Inpatient Hospital Stay (HOSPITAL_COMMUNITY): Admission: AD | Admit: 2010-07-17 | Discharge: 2010-07-19 | Payer: Self-pay | Admitting: Gastroenterology

## 2010-07-17 ENCOUNTER — Ambulatory Visit: Payer: Self-pay | Admitting: Gastroenterology

## 2010-07-17 ENCOUNTER — Telehealth (INDEPENDENT_AMBULATORY_CARE_PROVIDER_SITE_OTHER): Payer: Self-pay | Admitting: *Deleted

## 2010-07-17 LAB — CONVERTED CEMR LAB
CRP, High Sensitivity: 19.18 — ABNORMAL HIGH (ref 0.00–5.00)
Iron: 27 ug/dL — ABNORMAL LOW (ref 42–145)
Saturation Ratios: 6.9 % — ABNORMAL LOW (ref 20.0–50.0)

## 2010-07-19 ENCOUNTER — Encounter: Payer: Self-pay | Admitting: Gastroenterology

## 2010-07-25 ENCOUNTER — Telehealth: Payer: Self-pay | Admitting: Gastroenterology

## 2010-07-26 ENCOUNTER — Ambulatory Visit: Payer: Self-pay | Admitting: Nurse Practitioner

## 2010-07-26 ENCOUNTER — Encounter: Payer: Self-pay | Admitting: Physician Assistant

## 2010-07-26 ENCOUNTER — Telehealth: Payer: Self-pay | Admitting: Nurse Practitioner

## 2010-07-26 DIAGNOSIS — E8941 Symptomatic postprocedural ovarian failure: Secondary | ICD-10-CM | POA: Insufficient documentation

## 2010-07-27 ENCOUNTER — Telehealth (INDEPENDENT_AMBULATORY_CARE_PROVIDER_SITE_OTHER): Payer: Self-pay | Admitting: Nurse Practitioner

## 2010-07-27 LAB — CONVERTED CEMR LAB
Amphetamine Screen, Ur: POSITIVE — AB
Barbiturate Quant, Ur: NEGATIVE
Methadone: NEGATIVE
Opiate Screen, Urine: NEGATIVE

## 2010-08-01 ENCOUNTER — Encounter: Payer: Self-pay | Admitting: Gastroenterology

## 2010-08-01 ENCOUNTER — Telehealth (INDEPENDENT_AMBULATORY_CARE_PROVIDER_SITE_OTHER): Payer: Self-pay | Admitting: *Deleted

## 2010-08-09 ENCOUNTER — Telehealth (INDEPENDENT_AMBULATORY_CARE_PROVIDER_SITE_OTHER): Payer: Self-pay | Admitting: Nurse Practitioner

## 2010-08-14 ENCOUNTER — Telehealth: Payer: Self-pay | Admitting: Gastroenterology

## 2010-08-21 ENCOUNTER — Encounter: Payer: Self-pay | Admitting: Family Medicine

## 2010-08-22 ENCOUNTER — Telehealth (INDEPENDENT_AMBULATORY_CARE_PROVIDER_SITE_OTHER): Payer: Self-pay | Admitting: Nurse Practitioner

## 2010-08-27 ENCOUNTER — Ambulatory Visit: Payer: Self-pay | Admitting: Nurse Practitioner

## 2010-09-01 ENCOUNTER — Ambulatory Visit: Payer: Self-pay | Admitting: Psychiatry

## 2010-09-04 ENCOUNTER — Encounter (INDEPENDENT_AMBULATORY_CARE_PROVIDER_SITE_OTHER): Payer: Self-pay | Admitting: Nurse Practitioner

## 2010-09-05 ENCOUNTER — Telehealth: Payer: Self-pay | Admitting: Family Medicine

## 2010-09-13 ENCOUNTER — Telehealth (INDEPENDENT_AMBULATORY_CARE_PROVIDER_SITE_OTHER): Payer: Self-pay | Admitting: Nurse Practitioner

## 2010-09-14 ENCOUNTER — Telehealth (INDEPENDENT_AMBULATORY_CARE_PROVIDER_SITE_OTHER): Payer: Self-pay | Admitting: *Deleted

## 2010-09-19 ENCOUNTER — Encounter (INDEPENDENT_AMBULATORY_CARE_PROVIDER_SITE_OTHER): Payer: Self-pay | Admitting: *Deleted

## 2010-09-20 ENCOUNTER — Emergency Department (HOSPITAL_COMMUNITY): Admission: EM | Admit: 2010-09-20 | Discharge: 2010-04-20 | Payer: Self-pay | Admitting: Emergency Medicine

## 2010-09-20 ENCOUNTER — Emergency Department (HOSPITAL_COMMUNITY): Admission: EM | Admit: 2010-09-20 | Discharge: 2010-03-13 | Payer: Self-pay | Admitting: Emergency Medicine

## 2010-09-25 ENCOUNTER — Encounter (INDEPENDENT_AMBULATORY_CARE_PROVIDER_SITE_OTHER): Payer: Self-pay | Admitting: Nurse Practitioner

## 2010-09-25 ENCOUNTER — Ambulatory Visit: Payer: Self-pay | Admitting: Nurse Practitioner

## 2010-09-25 LAB — CONVERTED CEMR LAB
Amphetamine Screen, Ur: POSITIVE — AB
Barbiturate Quant, Ur: NEGATIVE
Benzodiazepines.: POSITIVE — AB
Glucose, Urine, Semiquant: NEGATIVE
Marijuana Metabolite: NEGATIVE
Methadone: NEGATIVE
Nitrite: NEGATIVE
Opiate Screen, Urine: NEGATIVE
Propoxyphene: NEGATIVE
Protein, U semiquant: NEGATIVE
WBC Urine, dipstick: NEGATIVE

## 2010-09-27 ENCOUNTER — Telehealth (INDEPENDENT_AMBULATORY_CARE_PROVIDER_SITE_OTHER): Payer: Self-pay | Admitting: Nurse Practitioner

## 2010-10-01 ENCOUNTER — Encounter (INDEPENDENT_AMBULATORY_CARE_PROVIDER_SITE_OTHER): Payer: Self-pay | Admitting: *Deleted

## 2010-10-04 ENCOUNTER — Telehealth (INDEPENDENT_AMBULATORY_CARE_PROVIDER_SITE_OTHER): Payer: Self-pay | Admitting: Nurse Practitioner

## 2010-10-16 ENCOUNTER — Emergency Department (HOSPITAL_COMMUNITY)
Admission: EM | Admit: 2010-10-16 | Discharge: 2010-10-17 | Disposition: A | Discharge status: Other Acute Inpatient Hospital | Payer: Self-pay | Source: Home / Self Care | Admitting: Emergency Medicine

## 2010-10-16 ENCOUNTER — Telehealth (INDEPENDENT_AMBULATORY_CARE_PROVIDER_SITE_OTHER): Payer: Self-pay | Admitting: Nurse Practitioner

## 2010-10-17 ENCOUNTER — Inpatient Hospital Stay (HOSPITAL_COMMUNITY)
Admission: AD | Admit: 2010-10-17 | Discharge: 2010-10-22 | Payer: Self-pay | Source: Home / Self Care | Attending: Psychiatry | Admitting: Psychiatry

## 2010-10-17 DIAGNOSIS — F331 Major depressive disorder, recurrent, moderate: Secondary | ICD-10-CM

## 2010-10-22 ENCOUNTER — Encounter (INDEPENDENT_AMBULATORY_CARE_PROVIDER_SITE_OTHER): Payer: Self-pay | Admitting: Nurse Practitioner

## 2010-10-23 ENCOUNTER — Telehealth (INDEPENDENT_AMBULATORY_CARE_PROVIDER_SITE_OTHER): Payer: Self-pay | Admitting: Nurse Practitioner

## 2010-10-23 ENCOUNTER — Encounter (INDEPENDENT_AMBULATORY_CARE_PROVIDER_SITE_OTHER): Payer: Self-pay | Admitting: *Deleted

## 2010-10-26 ENCOUNTER — Encounter (INDEPENDENT_AMBULATORY_CARE_PROVIDER_SITE_OTHER): Payer: Self-pay | Admitting: Nurse Practitioner

## 2010-10-29 ENCOUNTER — Telehealth (INDEPENDENT_AMBULATORY_CARE_PROVIDER_SITE_OTHER): Payer: Self-pay | Admitting: Nurse Practitioner

## 2010-11-02 ENCOUNTER — Ambulatory Visit: Admit: 2010-11-02 | Payer: Self-pay | Admitting: Nurse Practitioner

## 2010-11-03 ENCOUNTER — Encounter: Payer: Self-pay | Admitting: Internal Medicine

## 2010-11-04 ENCOUNTER — Encounter: Payer: Self-pay | Admitting: Family Medicine

## 2010-11-04 ENCOUNTER — Encounter: Payer: Self-pay | Admitting: Gastroenterology

## 2010-11-15 NOTE — Progress Notes (Signed)
Summary: PAIN MEDS ARE DUE  Phone Note Call from Patient Call back at Home Phone [phone]   Reason for Call: Refill Medication Summary of Call: [name] CALLED TO CHECK ON HER REFS. TO THE PAIN CLINIC, AND SHE WAS TOLD THAT  [facility] DENIED HER, AND SHE CALLED THE PAIN CLINIC IN [place], THERE # IS [phone] AND WAS TOLD BY THEM THAT SHE WAS NOT DENIED AND THE DR WAS STILL REVIEWING HER CHART AND NO APPT. YET FOR HER. SHE SAYS THAT SINCE SHE WAS DENIED,  AND TO ALSO LET YOU KNOW THAT HER XANAX, OXYCODONE AND ADDERALL XR ARE DUE. [name] SAYS THAT W/THE ADDERALL XR IT HAS TO SAY MEDICALLY NECESSARY. Initial call taken by: Leodis Rains,  October 23, 2010 11:02 AM  Follow-up for Phone Call        Arna Medici to check on referral - advise pt that it is not this providers responsibility to find a pain clinic that will accept her We can only offer that service as a courtesy she may need to find one. As indicated via letter and by phone on last month this office will NOT be responsible for her chronic pain medications.  If she does not like that decision then she is welcome to find another office who may do so.  I will agree to fill her medications one more time for the month of January but that will be the last Rx.  She will need to be transitioned to pain clinic or an office willing to fill her chronic pain meds by that time. Rx in basket.   Follow-up by: Lehman Prom FNP,  October 23, 2010 12:38 PM  Additional Follow-up for Phone Call Additional follow up Details #1::        [name] SAYS THAT SHE CALLED [facility] IN [place] ABOUT HER GI REF. THERE # IS [phone]. THEY JUST NEED [place] TO CALL. Additional Follow-up by: Leodis Rains,  October 23, 2010 12:45 PM    Additional Follow-up for Phone Call Additional follow up Details #2::    I CALL [facility] [place] PT HAS AN APPT 10-29-10 @ 8:45AM LVM TO PT TO CALL ME BACK.[name]  October 23, 2010  2:54 PM MS Thede CALL ME SHE IS AWARE OF YOUR MESSAGE ANS SHE ALSO SAID THAT SHE CALL THE PAIN CLINIC IN [place] AND THEY TOLD HER THAT THEY STILL REVIEWING HER REFERRAL AND SHE WILL CALL ME WHEN THEY GIVE HER AN ANSWER   Follow-up by: [name],  October 23, 2010 4:51 PM  Prescriptions: OXYCODONE HCL 15 MG TABS (OXYCODONE HCL) One tablet by mouth four times a day for pain  #120 x 0   Entered and Authorized by:   Lehman Prom FNP   Signed by:   Lehman Prom FNP on 10/23/2010   Method used:   Print then Give to Patient   RxID:   [id number] ADDERALL XR 30 MG XR24H-CAP (AMPHETAMINE-DEXTROAMPHETAMINE) 3 capsules by mouth daily  **Brand name medically necessary** Brand medically necessary #30 x 0   Entered and Authorized by:   Lehman Prom FNP   Signed by:   Lehman Prom FNP on 10/23/2010   Method used:   Print then Give to Patient   RxID:   [id number] XANAX 1 MG  TABS (ALPRAZOLAM) One tablet by mouth three times a day  #90 x 0   Entered and Authorized by:   Lehman Prom FNP  Signed by:   Lehman Prom FNP on 10/23/2010   Method used:   Print then Give to Patient   RxID:   [id number]

## 2010-11-15 NOTE — Progress Notes (Signed)
Summary: Pt req script for Reg Adderall 30mg   Phone Note Call from Patient Call back at Home Phone [phone]   Caller: Patient Summary of Call: Pt called req script for Adderall 30mg . Pls call when ready for pick up.  Initial call taken by: Lucy Antigua,  Feb 14, 2010 4:19 PM  Follow-up for Phone Call        NO she already has a refill available for this month. I gave her a 3 month supply in March.  Follow-up by: Nelwyn Salisbury MD,  Feb 14, 2010 6:16 PM  Additional Follow-up for Phone Call Additional follow up Details #1::        Phone Call Completed Additional Follow-up by: Raechel Ache, RN,  Feb 15, 2010 8:46 AM

## 2010-11-15 NOTE — Assessment & Plan Note (Signed)
Summary: ?sinus inf/njr   Vital Signs:  Patient profile:   35 year old female Temp:     98 degrees F oral BP sitting:   110 / 80  (left arm) Cuff size:   regular  Vitals Entered By: Raechel Ache, RN (January 03, 2010 4:00 PM) CC: C/o sneezing, watery eyes, laryngitis and cough   History of Present Illness: Here for one week of HA, sinus pressure, ear pains, and a dry cough. No fever. On Mucinex. Dr. Jarold Song plans to do a hysterectomy and a bladder tack on 01-23-10 via a laparoscopic approach, and he will explore for endometriosis leisons as well. 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 I do not have access to the most recent report, but someone has told [name] that it has gotten a little larger than before. She is waiting to hear from Dr. Arlyce Dice about this. Her recent colonosocpy was clear.   Allergies: 1)  ! Penicillin G Potassium (Penicillin G Potassium) 2)  ! Darvocet  Past History:  Past Medical History: Anemia-NOS Depression ADD Headaches Anxiety IBS INTERNAL HEMORRHOIDS/SURGERY 2007  Past Surgical History: Reviewed history from 11/24/2009 and no changes required. PPH colonoscopy 11-15-09 per Dr. Arlyce Dice, clear except for some hemorrhoids  Review of Systems  The patient denies anorexia, fever, weight loss, weight gain, vision loss, decreased hearing, hoarseness, chest pain, syncope, dyspnea on exertion, peripheral edema, hemoptysis, abdominal pain, melena, hematochezia, severe indigestion/heartburn, hematuria, incontinence, genital sores, muscle weakness, suspicious skin lesions, transient blindness, difficulty walking, depression, unusual weight change, abnormal bleeding, enlarged lymph nodes, angioedema, breast masses, and testicular masses.    Physical Exam  General:  Well-developed,well-nourished,in no acute  distress; alert,appropriate and cooperative throughout examination Head:  Normocephalic and atraumatic without obvious abnormalities. No apparent alopecia or balding. Eyes:  No corneal or conjunctival inflammation noted. EOMI. Perrla. Funduscopic exam benign, without hemorrhages, exudates or papilledema. Vision grossly normal. Ears:  External ear exam shows no significant lesions or deformities.  Otoscopic examination reveals clear canals, tympanic membranes are intact bilaterally without bulging, retraction, inflammation or discharge. Hearing is grossly normal bilaterally. Nose:  External nasal examination shows no deformity or inflammation. Nasal mucosa are pink and moist without lesions or exudates. Mouth:  Oral mucosa and oropharynx without lesions or exudates.  Teeth in good repair. Neck:  No deformities, masses, or tenderness noted. Lungs:  Normal respiratory effort, chest expands symmetrically. Lungs are clear to auscultation, no crackles or wheezes.   Impression & Recommendations:  Problem # 1:  SINUSITIS, ACUTE NOS (ICD-461.9)  Her updated medication list for this problem includes:    Zithromax Z-pak 250 Mg Tabs (Azithromycin) .Marland Kitchen... As directed  Problem # 2:  LOW BACK PAIN SYNDROME (ICD-724.2)  Her updated medication list for this problem includes:    Oxycodone Hcl 10 Mg Tabs (Oxycodone hcl) .Marland Kitchen... 1 q 6 hours as needed pain  Problem # 3:  ABNORMAL FINDINGS GI TRACT (ICD-793.4)  Complete Medication List: 1)  Xanax 1 Mg Tabs (Alprazolam) .... Three times a day 2)  Promethazine Hcl 25 Mg Tabs (Promethazine hcl) .... Three times a day as needed nausea 3)  Adderall 30 Mg Tabs (Amphetamine-dextroamphetamine) .... Three times a day, may fill on 02-12-10 4)  Oxycodone Hcl 10 Mg Tabs (Oxycodone hcl) .Marland Kitchen.. 1 q 6 hours as needed pain 5)  Maxalt 10 Mg Tabs (  Rizatriptan benzoate) .... As needed for migraines 6)  Zithromax Z-pak 250 Mg Tabs (Azithromycin) .... As directed  Patient  Instructions: 1)  Treat with a Zpack. Refilled her pain meds. She is not sure if Dr. Jarold Song is aware of the periaortic lesion or not. I would hope that Dr. Jarold Song and Dr. Arlyce Dice could talk together about this. If there is any concern about a need to biopsy this lesion, it would seem to make sense that this could be undertaken during her upcoming laparoscopic procedure.  Prescriptions: ZITHROMAX Z-PAK 250 MG TABS (AZITHROMYCIN) as directed  #1 x 0   Entered and Authorized by:   Nelwyn Salisbury MD   Signed by:   Nelwyn Salisbury MD on 01/03/2010   Method used:   Print then Give to Patient   RxID:   [id number] OXYCODONE HCL 10 MG TABS (OXYCODONE HCL) 1 q 6 hours as needed pain  #100 x 0   Entered and Authorized by:   Nelwyn Salisbury MD   Signed by:   Nelwyn Salisbury MD on 01/03/2010   Method used:   Print then Give to Patient   RxID:   [id number] [name] 1 MG  TABS (ALPRAZOLAM) three times a day  #90 x 5   Entered and Authorized by:   Nelwyn Salisbury MD   Signed by:   Nelwyn Salisbury MD on 01/03/2010   Method used:   Print then Give to Patient   RxID:   [id number]

## 2010-11-15 NOTE — Letter (Signed)
Summary: [facility] Instructions  [facility]  [address] [place], [place] [address]   Phone: [phone]  Fax: [phone]       Danielle BADALAMENTI    08-09-1976    MRN: [id number]        Procedure Day Dorna Bloom:   Wednesday February 2nd, 2011     Arrival Time: 10:30am     Procedure Time:  12:30pm     Location of Procedure:                      [facility] ( Outpatient Registration)                        PREPARATION FOR COLONOSCOPY WITH MOVIPREP   Starting 5 days prior to your procedure: 11-10-2009- Do not eat nuts, seeds, popcorn, corn, beans, peas,  salads, or any raw vegetables.  Do not take any fiber supplements (e.g. Metamucil, Citrucel, and Benefiber).  THE DAY BEFORE YOUR PROCEDURE:  Tuesday February 1st, 2011  1.  Drink clear liquids the entire day-NO SOLID FOOD  2.  Do not drink anything colored red or purple.  Avoid juices with pulp.  No orange juice.  3.  Drink at least 64 oz. (8 glasses) of fluid/clear liquids during the day to prevent dehydration and help the prep work efficiently.  CLEAR LIQUIDS INCLUDE: Water Jello Ice Popsicles Tea (sugar ok, no milk/cream) Powdered fruit flavored drinks Coffee (sugar ok, no milk/cream) Gatorade Juice: apple, white grape, white cranberry  Lemonade Clear bullion, consomm, broth Carbonated beverages (any kind) Strained chicken noodle soup Hard Candy                             4.  In the morning, mix first dose of MoviPrep solution:    Empty 1 Pouch A and 1 Pouch B into the disposable container    Add lukewarm drinking water to the top line of the container. Mix to dissolve    Refrigerate (mixed solution should be used within 24 hrs)  5.  Begin drinking the prep at 5:00 p.m. The MoviPrep container is divided by 4 marks.   Every 15 minutes drink the solution down to the next mark (approximately 8 oz) until the full liter is complete.   6.  Follow completed prep with 16 oz of clear liquid of  your choice (Nothing red or purple).  Continue to drink clear liquids until bedtime.  7.  Before going to bed, mix second dose of MoviPrep solution:    Empty 1 Pouch A and 1 Pouch B into the disposable container    Add lukewarm drinking water to the top line of the container. Mix to dissolve    Refrigerate  THE DAY OF YOUR PROCEDURE:  Wednesday february 2nd, 2011  Beginning at  6:30a.m. (6 hours before procedure):         1. Every 15 minutes, drink the solution down to the next mark (approx 8 oz) until the full liter is complete.  2. Follow completed prep with 16 oz. of clear liquid of your choice.    3. You may drink clear liquids until: 8:30am   ( 4 HOURS BEFORE PROCEDURE).   MEDICATION INSTRUCTIONS  Unless otherwise instructed, you should take regular prescription medications with a small sip of water   as early as possible the morning of your procedure.  OTHER INSTRUCTIONS  You will need a responsible adult at least 32 years of age to accompany you and drive you home.   This person must remain in the waiting room during your procedure.  Wear loose fitting clothing that is easily removed.  Leave jewelry and other valuables at home.  However, you may wish to bring a book to read or  an iPod/MP3 player to listen to music as you wait for your procedure to start.  Remove all body piercing jewelry and leave at home.  Total time from sign-in until discharge is approximately 2-3 hours.  You should go home directly after your procedure and rest.  You can resume normal activities the  day after your procedure.  The day of your procedure you should not:   Drive   Make legal decisions   Operate machinery   Drink alcohol   Return to work  You will receive specific instructions about eating, activities and medications before you leave.    The above instructions have been reviewed and explained to [name] by phone and mailed to her by Laureen Ochs  LPN  November 03, 2009 3:32 PM  Appended Document: Moviprep Instructions Letter mailed to patient.

## 2010-11-15 NOTE — Letter (Signed)
Summary: Discharge Letter  [facility]  [address] [place], [place] [address]   Phone: [phone]  Fax: [phone]       08/01/2010 MRN: [id number]  [facility] [address] [place], [place]  [address]  Dear Ms. Cofield,   I find it necessary to inform you that I will not be able to provide medical care to you, because of non-compliance.  Since your condition requires medical attention, I suggest that you place yourself under the care of another physician without delay. If you desire, I will be available for emergency care for 30 days after you receive this letter.  This should give you ample time to select a physician of your choice from the many competent providers in this area. You may want to call the local medical society or [facility]'s physician referral service [phone]) for their assistance in locating a new physician. With your written authorization, I will make a copy of your medical record available to your new physician.   Sincerely,    Barbette Hair. Arlyce Dice, MD

## 2010-11-15 NOTE — Assessment & Plan Note (Signed)
Summary: fatigue/faint/tingling in fingers and legs/cjr   Vital Signs:  Patient profile:   35 year old female Temp:     98.1 degrees F oral Resp:     20 per minute BP sitting:   128 / 86  (left arm) Cuff size:   regular  Vitals Entered By: Raechel Ache, RN (Feb 19, 2010 9:02 AM) CC: C/o passing out, pain and numbness L side, can't eat and belly swollen.   History of Present Illness: Here for a number of complaints including chronic abdominal pain and chronic low back pain. She is taking 6 Oxycodones a day, and she asks if she can take more because of her pain. Her abdomen is swelling again, and this puts a lot of pressure on her back. She is still scheduled for her hysterectomy with rectal tack and bladder tack on 02-27-10. She describes a lot of depressiona nd anxiety and has been crying a lot lately. She is caring for her childern all by herself , and this gest quite overwhelming for her. She does have a friend who helps with the kids a few hours a day. She describes generalized weakness and shakiness, constant nausea, and a poor appetite. She has fallen at home several times due to weakness.   Allergies: 1)  ! Penicillin G Potassium (Penicillin G Potassium) 2)  ! Darvocet  Past History:  Past Medical History: Anemia-NOS Depression ADD Headaches Anxiety IBS chronic abdominal pain chronic low back pain  Past Surgical History: PPH colonoscopy 11-15-09 per Dr. Arlyce Dice, clear except for some hemorrhoids internal hemorrhoid surgery  Review of Systems  The patient denies anorexia, fever, weight loss, vision loss, decreased hearing, hoarseness, chest pain, syncope, dyspnea on exertion, peripheral edema, prolonged cough, headaches, hemoptysis, melena, hematochezia, severe indigestion/heartburn, hematuria, incontinence, genital sores, muscle weakness, suspicious skin lesions, transient blindness, difficulty walking, depression, unusual weight change, abnormal bleeding, enlarged lymph  nodes, angioedema, breast masses, and testicular masses.    Physical Exam  General:  crying, otherwise is at her baseline Neck:  No deformities, masses, or tenderness noted. Lungs:  Normal respiratory effort, chest expands symmetrically. Lungs are clear to auscultation, no crackles or wheezes. Heart:  Normal rate and regular rhythm. S1 and S2 normal without gallop, murmur, click, rub or other extra sounds. Abdomen:  bloated, mildly tender diffusely, no masses or HSM Pulses:  R and L carotid,radial,femoral,dorsalis pedis and posterior tibial pulses are full and equal bilaterally Extremities:  trace left pedal edema and trace right pedal edema.   Neurologic:  alert & oriented X3, cranial nerves II-XII intact, and gait normal.   Psych:  Oriented X3, memory intact for recent and remote, normally interactive, good eye contact, depressed affect, and tearful.     Impression & Recommendations:  Problem # 1:  LOW BACK PAIN SYNDROME (ICD-724.2)  Her updated medication list for this problem includes:    Oxycodone Hcl 10 Mg Tabs (Oxycodone hcl) .Marland Kitchen... 1 q 4  hours as needed pain  Orders: Demerol  100mg   Injection (E4540) Promethazine up to 50mg  (J2550) Admin of Therapeutic Inj  intramuscular or subcutaneous (98119)  Problem # 2:  ABDOMINAL PAIN, GENERALIZED (ICD-789.07)  Orders: Venipuncture (14782) TLB-BMP (Basic Metabolic Panel-BMET) (80048-METABOL) TLB-CBC Platelet - w/Differential (85025-CBCD) TLB-Hepatic/Liver Function Pnl (80076-HEPATIC) TLB-TSH (Thyroid Stimulating Hormone) (84443-TSH) TLB-B12, Serum-Total ONLY (95621-H08)  Problem # 3:  ANXIETY (ICD-300.00)  Her updated medication list for this problem includes:    Xanax 1 Mg Tabs (Alprazolam) .Marland Kitchen... Three times a day  Cymbalta 30 Mg Cpep (Duloxetine hcl) .Marland Kitchen... 1 once daily  Problem # 4:  DEPRESSION (ICD-311)  Her updated medication list for this problem includes:    Xanax 1 Mg Tabs (Alprazolam) .Marland Kitchen... Three times a day     Cymbalta 30 Mg Cpep (Duloxetine hcl) .Marland Kitchen... 1 once daily  Complete Medication List: 1)  Xanax 1 Mg Tabs (Alprazolam) .... Three times a day 2)  Promethazine Hcl 25 Mg Tabs (Promethazine hcl) .... Three times a day as needed nausea 3)  Adderall 30 Mg Tabs (Amphetamine-dextroamphetamine) .... Three times a day 4)  Oxycodone Hcl 10 Mg Tabs (Oxycodone hcl) .Marland Kitchen.. 1 q 4  hours as needed pain 5)  Maxalt 10 Mg Tabs (Rizatriptan benzoate) .... As needed for migraines 6)  Cymbalta 30 Mg Cpep (Duloxetine hcl) .Marland Kitchen.. 1 once daily  Patient Instructions: 1)  It seems her depression and anxiety are beginning to overwhelm her on top of her health issues. She was given an injectio today for pain. I told her she should limit herself to using a total of 60 mg per day of Oxycodone, and no more. We will screen with labs today to rule out any metabolic processes. I think all of her problems will improve once she has her hysterectomy.    Medication Administration  Injection # 1:    Medication: Demerol  100mg   Injection    Diagnosis: LOW BACK PAIN SYNDROME (ICD-724.2)    Route: IM    Site: RUOQ gluteus    Exp Date: 12/13/2010    Lot #: [id number]    Mfr: [email or web address]    Patient tolerated injection without complications    Given by: Raechel Ache, RN (Feb 19, 2010 10:29 AM)  Injection # 2:    Medication: Promethazine up to 50mg     Diagnosis: LOW BACK PAIN SYNDROME (ICD-724.2)    Route: IM    Site: RUOQ gluteus    Exp Date: 08/2011    Lot #: [id number]    Mfr: [email or web address]    Patient tolerated injection without complications    Given by: Raechel Ache, RN (Feb 19, 2010 10:30 AM)  Orders Added: 1)  Est. Patient Level IV [30865] 2)  Venipuncture [78469] 3)  TLB-BMP (Basic Metabolic Panel-BMET) [80048-METABOL] 4)  TLB-CBC Platelet - w/Differential [85025-CBCD] 5)  TLB-Hepatic/Liver Function Pnl [80076-HEPATIC] 6)  TLB-TSH (Thyroid Stimulating Hormone) [84443-TSH] 7)  TLB-B12, Serum-Total ONLY [82607-B12] 8)  Demerol   100mg   Injection [J2175] 9)  Promethazine up to 50mg  [J2550] 10)  Admin of Therapeutic Inj  intramuscular or subcutaneous [62952]

## 2010-11-15 NOTE — Miscellaneous (Signed)
Summary: Omeprazole, Phenergan RXs  Clinical Lists Changes  Medications: Added new medication of OMEPRAZOLE 40 MG  CPDR (OMEPRAZOLE) 1 each day 30 minutes before meal - Signed Added new medication of PROMETHAZINE HCL 25 MG  TABS (PROMETHAZINE HCL) Take one tablet every 6 hours as needed. - Signed Rx of OMEPRAZOLE 40 MG  CPDR (OMEPRAZOLE) 1 each day 30 minutes before meal;  #30 x 2;  Signed;  Entered by: Durwin Glaze RN;  Authorized by: Louis Meckel MD;  Method used: Electron[facility] to [id number] [address], [place], [place], [place]  [address], Ph: [phone], Fax: [phone] Rx of PROMETHAZINE HCL 25 MG  TABS (PROMETHAZINE HCL) Take one tablet every 6 hours as needed.;  #30 x 0;  Signed;  Entered by: Durwin Glaze RN;  Authorized by: Louis Meckel MD;  Method used: Electronically to [id number] [address], [place], [place], [place]  [address], Ph: [phone], Fax: [phone]    Prescriptions: PROMETHAZINE HCL 25 MG  TABS (PROMETHAZINE HCL) Take one tablet every 6 hours as needed.  #30 x 0   Entered by:   Durwin Glaze RN   Authorized by:   Louis Meckel MD   Signed by:   Durwin Glaze RN on 06/29/2010   Method used:   [facility]        [facility]  [address] [phone][facility] (retail)       [address]       [place], [place]  [address]       Ph: [phone]       Fax: [phone]   RxID:   [id number] OMEPRAZOLE 40 MG  CPDR (OMEPRAZOLE) 1 each day 30 minutes before meal  #30 x 2   Entered by:   Durwin Glaze RN   Authorized by:   Louis Meckel MD   Signed by:   Durwin Glaze RN on 06/29/2010   Method used:   [facility]        [facility]  [address] [phone]* (retail)       [address]       [place], [place]  [address]       Ph: [phone]       Fax: [phone]   RxID:   [id number]

## 2010-11-15 NOTE — Assessment & Plan Note (Signed)
Summary: URI?, COUGH, H/A // RS   Vital Signs:  Patient profile:   35 year old female Weight:      160 pounds O2 Sat:      96 % on Room air Temp:     98.6 degrees F BP sitting:   110 / 78  (left arm) Cuff size:   regular  Vitals Entered By: Pura Spice, RN (November 24, 2009 2:03 PM)  O2 Flow:  Room air CC: coughing alot headache wants something besides replax Is Patient Diabetic? No   History of Present Illness: Here to follow up numerous complaints, including a migraine HA today. Her fiance drove her here. She has a typical pounding frontal HA with nausea. She took 2 Repax tablets todat with no relief. Also she continues to have severe intermittent left low back pains which radiates down the back of the entire left leg to the foot. Her left great toe gets numb, anbd she feels warm tingling sensations up and down the leg. The last MRI she had on the lumbar spine was in 2006. She told told at that time that she had pinched nerves in her back but that nothing could be done about this.   Allergies: 1)  ! Penicillin G Potassium (Penicillin G Potassium) 2)  ! Darvocet  Past History:  Past Medical History: Reviewed history from 10/26/2009 and no changes required. Anemia-NOS Depression ADD HeadacheS Anxiety IBS INTERNAL HEMORRHOIDS/SURGERY 2007  Past Surgical History: PPH colonoscopy 11-15-09 per Dr. Arlyce Dice, clear except for some hemorrhoids  Review of Systems  The patient denies anorexia, fever, weight loss, weight gain, vision loss, decreased hearing, hoarseness, chest pain, syncope, dyspnea on exertion, peripheral edema, prolonged cough, hemoptysis, abdominal pain, melena, hematochezia, severe indigestion/heartburn, hematuria, incontinence, genital sores, muscle weakness, suspicious skin lesions, transient blindness, difficulty walking, depression, unusual weight change, abnormal bleeding, enlarged lymph nodes, angioedema, breast masses, and testicular masses.    Physical  Exam  General:  Well-developed,well-nourished,in no acute distress; alert,appropriate and cooperative throughout examination Head:  Normocephalic and atraumatic without obvious abnormalities. No apparent alopecia or balding. Eyes:  No corneal or conjunctival inflammation noted. EOMI. Perrla. Funduscopic exam benign, without hemorrhages, exudates or papilledema. Vision grossly normal. Ears:  External ear exam shows no significant lesions or deformities.  Otoscopic examination reveals clear canals, tympanic membranes are intact bilaterally without bulging, retraction, inflammation or discharge. Hearing is grossly normal bilaterally. Nose:  External nasal examination shows no deformity or inflammation. Nasal mucosa are pink and moist without lesions or exudates. Mouth:  Oral mucosa and oropharynx without lesions or exudates.  Teeth in good repair. Neck:  No deformities, masses, or tenderness noted. Lungs:  Normal respiratory effort, chest expands symmetrically. Lungs are clear to auscultation, no crackles or wheezes. Heart:  Normal rate and regular rhythm. S1 and S2 normal without gallop, murmur, click, rub or other extra sounds. Abdomen:  soft, normal bowel sounds, no distention, no masses, no guarding, no rigidity, no rebound tenderness, no abdominal hernia, no inguinal hernia, no hepatomegaly, and no splenomegaly.  Mildly swollen and tender diffusely.  Msk:  tender in the left lower back with spasm and reduced ROM, positive left SLR Neurologic:  alert & oriented X3.     Impression & Recommendations:  Problem # 1:  BRONCHITIS, ACUTE (ICD-466.0)  Problem # 2:  HEADACHE (ICD-784.0)  Her updated medication list for this problem includes:    Vicodin 5-500 Mg Tabs (Hydrocodone-acetaminophen) .Marland Kitchen... 1 every 6 hours as needed    Relpax 20  Mg Tabs (Eletriptan hydrobromide) .Marland Kitchen... As need for migraines    Oxycodone Hcl 10 Mg Tabs (Oxycodone hcl) .Marland Kitchen... 1 q 6 hours as needed pain    Maxalt 10 Mg Tabs  (Rizatriptan benzoate) .Marland Kitchen... As needed for migraines  Orders: Demerol  100mg   Injection (Z6109) Promethazine up to 50mg  (J2550) Admin of Therapeutic Inj  intramuscular or subcutaneous (60454)  Problem # 3:  LOW BACK PAIN SYNDROME (ICD-724.2)  Her updated medication list for this problem includes:    Vicodin 5-500 Mg Tabs (Hydrocodone-acetaminophen) .Marland Kitchen... 1 every 6 hours as needed    Oxycodone Hcl 10 Mg Tabs (Oxycodone hcl) .Marland Kitchen... 1 q 6 hours as needed pain  Complete Medication List: 1)  Vicodin 5-500 Mg Tabs (Hydrocodone-acetaminophen) .Marland Kitchen.. 1 every 6 hours as needed 2)  Xanax 1 Mg Tabs (Alprazolam) .... Three times a day 3)  Anamantle Hc 3-0.5 % Crea (Lidocaine-hydrocortisone ace) .... Apply three times a day as needed 4)  Promethazine Hcl 25 Mg Tabs (Promethazine hcl) .... Three times a day as needed nausea 5)  Relpax 20 Mg Tabs (Eletriptan hydrobromide) .... As need for migraines 6)  Adderall 30 Mg Tabs (Amphetamine-dextroamphetamine) .... Three times a day 7)  Lamictal 25 Mg Tabs (Lamotrigine) .Marland Kitchen.. 1 by mouth once daily 8)  Moviprep 100 Gm Solr (Peg-kcl-nacl-nasulf-na asc-c) .... As per prep instructions. 9)  Bentyl 10 Mg Caps (Dicyclomine hcl) .... Take 1 tab  twice daily as needed for cramping and spasms 10)  Oxycodone Hcl 10 Mg Tabs (Oxycodone hcl) .Marland Kitchen.. 1 q 6 hours as needed pain 11)  Maxalt 10 Mg Tabs (Rizatriptan benzoate) .... As needed for migraines  Other Orders: Radiology Referral (Radiology)  Patient Instructions: 1)  Set up a lumbar MRI soon. Try Maxalt for the HAs.  Prescriptions: ADDERALL 30 MG TABS (AMPHETAMINE-DEXTROAMPHETAMINE) three times a day  #90 x 0   Entered and Authorized by:   Nelwyn Salisbury MD   Signed by:   Nelwyn Salisbury MD on 11/24/2009   Method used:   Print then Give to Patient   RxID:   [id number] MAXALT 10 MG TABS (RIZATRIPTAN BENZOATE) as needed for migraines Brand medically necessary #12 x 11   Entered and Authorized by:   Nelwyn Salisbury  MD   Signed by:   Nelwyn Salisbury MD on 11/24/2009   Method used:   Print then Give to Patient   RxID:   [id number] OXYCODONE HCL 10 MG TABS (OXYCODONE HCL) 1 q 6 hours as needed pain  #60 x 0   Entered and Authorized by:   Nelwyn Salisbury MD   Signed by:   Nelwyn Salisbury MD on 11/24/2009   Method used:   Print then Give to Patient   RxID:   [id number]    Medication Administration  Injection # 1:    Medication: Demerol  100mg   Injection    Diagnosis: HEADACHE (ICD-784.0)    Route: IM    Site: RUOQ gluteus    Exp Date: 03/0102011    Lot #: [id number]    Mfr: [email or web address]    Patient tolerated injection without complications    Given by: Pura Spice, RN (November 24, 2009 3:11 PM)  Injection # 2:    Medication: Promethazine up to 50mg     Diagnosis: HEADACHE (ICD-784.0)    Route: IM    Site: RUOQ gluteus    Exp Date: 02/2011    Lot #: [id number]    Mfr: [email or web address]  Patient tolerated injection without complications    Given by: Pura Spice, RN (November 24, 2009 3:12 PM)  Orders Added: 1)  Est. Patient Level IV [16109] 2)  Demerol  100mg   Injection [J2175] 3)  Promethazine up to 50mg  [J2550] 4)  Admin of Therapeutic Inj  intramuscular or subcutaneous [96372] 5)  Radiology Referral [Radiology]

## 2010-11-15 NOTE — Assessment & Plan Note (Signed)
Summary: ABD. PAIN, BLOATING, BLOOD & BLACK STOOLS   (DR.KAPLAN PT.)  ...   History of Present Illness Visit Type: Follow-up Visit Primary GI MD: Melvia Heaps MD [facility] Primary Provider: [name], MD Chief Complaint: Abdominal pain and rectal  bleeding, pt was seen at [facility] last night History of Present Illness:   35 YO FEMALE  RECENTLY KNOWN TO DR. KAPLAN.SHE HAD COLONOSCOPY IN 2/11 WHICH WAS NORMAL. CT SCAN AS PART OF WORKUP  IN 1/11 SHOWED SOFT TISSUE THICKENING ALONG THE AORTA AT ORIGIN OF IMA,?ARTERITIS,FIBROSIS,NONSPECIFIC INFLAMMATION. CT WAS REPEATED IN 5/11  ,2 WEEKS AFTER SHE HAD A HYSTERECTOMT,AND SHOWED IMPROVEMENT IN THI INFLAMMATION,EARLY ATHERSCLERSSIS-AGE ADVANCED,AND A SMALL FLUID COLLECTION  IN THE PELVIS FELT TO BE POSTOPERATIVE. SHE HAS HX OF A PPH PROCEDURE FOR HEMORRHOIDS IN 2004, AND HX OF RECTAL PROLAPSE ,RECTUM TACKED. SHE COMES IN NOW WITH ONGPOING ISSUES WITH ABDOMINAL PAIN. SHE RELATES EPISODES OF ACUTE ABDOMINAL PAIN,BLOATING AND DISTENTION,WITH INCREASING FREQUENCY OVER THE PAST 6 WEEKS. WITH THESE SHE GETS LMQ SHARP PAINS.,ONGOING NAUSEA, OCCASIONAL VOMITING.Marland Kitchen HAS BEEN TO THE ER AT [facility] Dowse SAYS EVERYTHING SHE EATS HURTS HER ABDOMEN, AND SHE LIVES ON [place].. SHE HAS SEEN SOME BLOOD IN HER STOOLS THE PAST FEW DAYS,BM'S FARLY REGULAR. SHE SAYS SHE IS MISERABLE ,CANNOT GO ON LIKE THIS,LOOKS PREGNANT MOST OF THE TIME. SHE HAS A BABY, AND HER SON WHO IS 9  WITH HER TODAY.   GI Review of Systems    Reports abdominal pain and  bloating.     Location of  Abdominal pain: generalized.    Denies acid reflux, belching, chest pain, dysphagia with liquids, dysphagia with solids, heartburn, loss of appetite, nausea, vomiting, vomiting blood, weight loss, and  weight gain.      Reports black tarry stools and  rectal bleeding.     Denies anal fissure, change in bowel habit, constipation, diarrhea, diverticulosis, fecal incontinence, heme positive stool, hemorrhoids, irritable  bowel syndrome, jaundice, light color stool, liver problems, and  rectal pain.    Current Medications (verified): 1)  Xanax 1 Mg  Tabs (Alprazolam) .... Three Times A Day 2)  Promethazine Hcl 25 Mg  Tabs (Promethazine Hcl) .... Three Times A Day As Needed Nausea 3)  Adderall 30 Mg Tabs (Amphetamine-Dextroamphetamine) .... Three Times A Day 4)  Cymbalta 60 Mg Cpep (Duloxetine Hcl) .... Once Daily 5)  Oxycodone Hcl 15 Mg Tabs (Oxycodone Hcl) .... One Four Times A Day As Needed Pain 6)  Omeprazole 40 Mg Cpdr (Omeprazole) .... Once Daily 7)  Maxalt-Mlt 10 Mg Tbdp (Rizatriptan Benzoate) .... As Needed For Ha  Allergies: 1)  ! Penicillin G Potassium (Penicillin G Potassium) 2)  ! Darvocet 3)  Morphine  Past History:  Past Medical History: Reviewed history from 04/04/2010 and no changes required. Anemia-NOS Depression ADD Headaches Anxiety IBS chronic abdominal pain, sees Dr. Melvia Heaps and Sondra Come PA chronic low back pain sees Dr. Jarold Song for GYN exams  Past Surgical History: Reviewed history from 04/25/2010 and no changes required. PPH to tack up internal hemorrhoids 2004 per Dr. Luisa Hart colonoscopy 11-15-09 per Dr. Arlyce Dice, clear except for some hemorrhoids laparoscopic assisted vaginal hysterectomy with A and P repair 02-27-10 per Dr. Jarold Song  Family History: Reviewed history from 10/26/2009 and no changes required. Family History of Colon Polyps:Mother Family History of Heart Disease: Mother Family History of Irritable Bowel Syndrome:Mother  Social History: Divorced unemployed,22 MONTH OLD CHILD AND AN 48 YEAR OLD,15 YO Patient currently smokes.  Alcohol Use -  no Daily Caffeine Use -3 Illicit Drug Use - no  Review of Systems       The patient complains of allergy/sinus, cough, fainting, fatigue, and fever.  The patient denies anemia, anxiety-new, arthritis/joint pain, back pain, blood in urine, breast changes/lumps, confusion, coughing up blood,  depression-new, headaches-new, hearing problems, heart murmur, heart rhythm changes, itching, menstrual pain, muscle pains/cramps, night sweats, nosebleeds, pregnancy symptoms, shortness of breath, skin rash, sleeping problems, sore throat, swelling of feet/legs, swollen lymph glands, thirst - excessive, urination - excessive, urination changes/pain, urine leakage, vision changes, and voice change.         SEE HPI  Vital Signs:  Patient profile:   35 year old female Height:      67.5 inches Weight:      155 pounds BMI:     24.00 Pulse rate:   112 / minute Pulse rhythm:   regular BP sitting:   102 / 60  (left arm) Cuff size:   regular  Vitals Entered By: Francee Piccolo CMA Duncan Dull) (June 15, 2010 3:51 PM)  Physical Exam  General:  Well developed, well nourished, no acute distress. Head:  Normocephalic and atraumatic. Eyes:  PERRLA, no icterus. Lungs:  Clear throughout to auscultation. Heart:  Regular rate and rhythm; no murmurs, rubs,  or bruits. Abdomen:  SOFT,PROTUBERANT,MILD DIFFUSE TENDERNESS,MORE MARKED IN LMQ,NO PALP HERNIA, BS+, NO MASS OR HSM Rectal:  TRACE HEME POSITIVE,EXT HEMS,RECTUM PARTIALLY PROLAPES INTERNALLY,FELT EASILY WITH TIP OF FINGER. Extremities:  No clubbing, cyanosis, edema or deformities noted. Neurologic:  Alert and  oriented x4;  grossly normal neurologically. Psych:  Alert and cooperative. Normal mood and affect.   Impression & Recommendations:  Problem # 1:  ABDOMINAL DISTENSION (ICD-787.3) Assessment Deteriorated  35 YO FEMALE WITH PERSISTENT EPISODES OF ABDOMINAL DISTENTION,PAIN OF UNCLEAR ETIOLOGY. THIS MAY BE ALL IBS,R/O MOTILITY DISORDER, INTERMITTENT OBSTRUCTION. PT HAS HAD A NONSPECIFIC SUBTLE PERIAOTIC SOFT TISSUE DENSITY NOTED ON CT ?INFLAMMATORY,?FIBROSIS. INCLEAR IF ANY RELATION TO HER SXS.   WORSENED SXS SINCE HYSTERECTOMY IN 5/11.  SCHEDULE FOR REPEAT CT ABD/PELVIS IF CT NEGATIVE-SBFT. CONTINUE PHENERGAN AS NEEDED. CONTINUE  HYDROCODONE AS NEEDED. LABSA AS BELOW.  Orders: CT Abdomen/Pelvis with Contrast (CT Abd/Pelvis w/con)  Problem # 2:  RECTAL PROLAPSE (ICD-569.1) Assessment: Comment Only HX OF, NO OVERT PROLAPSE CURRENTLY,DOES PARTIALLY PROLAPSE INTERNALLY  Problem # 3:  CHRONIC PAIN SYNDROME Assessment: Comment Only CHRONIC PAIN MED USE

## 2010-11-15 NOTE — Letter (Signed)
Summary: RECORDS RECEIVED FROM [place] [facility]  RECORDS RECEIVED FROM [place] [facility]   Imported By: Arta Bruce 09/12/2010 09:19:08  _____________________________________________________________________  External Attachment:    Type:   Image     Comment:   External Document

## 2010-11-15 NOTE — Progress Notes (Signed)
Summary: Rx Clarification  Phone Note From Pharmacy   Caller: [facility] Summary of Call: Pt had rx filled for lorazapam on 04/04/10, now requesting to have rx for alprazolam filled.  Is it ok to fill? Initial call taken by: Trixie Dredge,  April 17, 2010 3:43 PM  Follow-up for Phone Call        no    shouldnt take both   just the ativan that she was given recently have dr fry address when he returns  Follow-up by: Madelin Headings MD,  April 17, 2010 5:49 PM  Additional Follow-up for Phone Call Additional follow up Details #1::        Pharmacist called Additional Follow-up by: Raechel Ache, RN,  April 17, 2010 5:55 PM

## 2010-11-15 NOTE — Progress Notes (Signed)
Summary: R/S appt.  Phone Note Call from Patient Call back at Home Phone [phone]   Caller: Patient Call For: Willette Cluster Reason for Call: Talk to Nurse Summary of Call: pt. sch'd w/Paula this morning and would like to r/s...had another dr.'s visit and could not make appt. Initial call taken by: Karna Christmas,  July 26, 2010 11:53 AM  Follow-up for Phone Call        She needs to be on Dr. Marzetta Board schedule  per Willette Cluster RNP. Follow-up by: Joselyn Glassman,  July 27, 2010 10:53 AM

## 2010-11-15 NOTE — Progress Notes (Signed)
Summary: refill hydrocodone  Phone Note Refill Request Message from:  Pharmacy on October 18, 2009 4:32 PM  Refills Requested: Medication #1:  VICODIN 5-500 MG TABS 1 every 6 hours as needed Initial call taken by: Alfred Levins, CMA,  October 18, 2009 4:32 PM  Follow-up for Phone Call        call in #60 with 5 rf Follow-up by: Nelwyn Salisbury MD,  October 19, 2009 8:39 AM  Additional Follow-up for Phone Call Additional follow up Details #1::        Rx called to pharmacy Additional Follow-up by: Alfred Levins, CMA,  October 19, 2009 11:49 AM    Prescriptions: VICODIN 5-500 MG TABS (HYDROCODONE-ACETAMINOPHEN) 1 every 6 hours as needed  #60 x 5   Entered by:   Alfred Levins, CMA   Authorized by:   Nelwyn Salisbury MD   Signed by:   Alfred Levins, CMA on 10/19/2009   Method used:   Telephoned to ...       [facility]. [phone]* (retail)       [phone]       [place], [place]  [address]       Ph: [phone]       Fax: [phone]   RxID:   [id number]

## 2010-11-15 NOTE — Progress Notes (Signed)
Summary: swelling and pain - needs to be seen  Phone Note Call from Patient   Caller: Patient  [phone] Reason for Call: Talk to Nurse Summary of Call: Pt called in to speak with Dr Claris Che RN.... Pt adv that it has been 3 wks since she was evaluated.Marland KitchenMarland KitchenMarland KitchenMarland Kitchenswelling and pain has returned, pt denies any other sxs.... Pt wants to know what she should do for swelling and pain till she goes to physical therapy.... Pt was offered OV with physician but adv she didn't want to come in till after she hears from Dr Clent Ridges or his nurse...Marland KitchenMarland KitchenPt understands that Dr Clent Ridges is out of the office today and it may be tomorrow before the call is returned.   Initial call taken by: Debbra Riding,  January 18, 2010 10:34 AM  Follow-up for Phone Call        spoke with pt - cant get into PT for another 10 days - c/o swelling and pain in back and legs. will be having hysto surg soon. Will need to be seen tomorrow - transferred to scheduling for appt. KIK Follow-up by: Duard Brady LPN,  January 18, 2010 1:21 PM     Appended Document: swelling and pain - needs to be seen Patient was transferred to scheduling for appt to be made with Dr Clent Ridges for tomorrow.... Pt was given appts times that were open and she adv she would have to call back to make appt after she checks to make sure she will have transportation to the appt tomorrow.

## 2010-11-15 NOTE — Progress Notes (Signed)
Summary: please return call  Phone Note Call from Patient Call back at Home Phone [phone]   Caller: Patient--live call Reason for Call: Talk to Nurse Summary of Call: pt needs to discuss what main dx's she will need to submit to her disabitiy ins. She refused an appt to see dr Tylor Gambrill on Monday to discuss. Please return her call. Initial call taken by: Warnell Forester,  January 05, 2010 9:41 AM  Follow-up for Phone Call        I think the most important thing to mention is chronic low back pain, since we can demonstrate a herniated disc at L5-S1 by CT scan Follow-up by: Nelwyn Salisbury MD,  January 05, 2010 1:53 PM  Additional Follow-up for Phone Call Additional follow up Details #1::        LMTCB Additional Follow-up by: Raechel Ache, RN,  January 05, 2010 2:18 PM    Additional Follow-up for Phone Call Additional follow up Details #2::    called. Follow-up by: Raechel Ache, RN,  January 05, 2010 4:32 PM

## 2010-11-15 NOTE — Letter (Signed)
Summary: *HSN Results Follow up  [facility]  [address] [place], [place] [address]   Phone: [phone]  Fax: [phone]      10/01/2010   [name] [address] [place], [place]  [address]   Dear  Ms. [name],                            _  Comments: WE HAVE BEEN TRYING TO REACH YOU BY PHONE PLEASE, CALL THE OFFICE                     AT YOUR EARLIEST CONVINIENCE  THANK YOU.       _________________________________________________________ If you have any questions, please contact our office                     Sincerely,  [name] [facility]

## 2010-11-15 NOTE — Progress Notes (Signed)
Summary: TRIAGE-Colon/Propofol Rescheduled  Phone Note Call from Patient Call back at Home Phone [phone]   Caller: Patient Call For: Arlyce Dice Reason for Call: Talk to Nurse Summary of Call: Patient does not think that she can make her appt for today states that the prep started to work and is unable to get out of the bathroom Initial call taken by: Tawni Levy,  November 01, 2009 2:00 PM  Follow-up for Phone Call        [facility] spoke with pt. earlier and advised her to complete prep and we will try to get her procedure scheduled for 11-02-09.  I have just  spoken to pt., she now states the prep. DIDN'T work and she didn't drink the other part, as instructed. [facility] is unable to do procedure tomorrow. Colon has been cancelled, I will consult w/Dr.Krystel Fletchall for further instructions.  DR.Jammie Troup PLEASE ADVISE  Follow-up by: Laureen Ochs LPN,  November 01, 2009 4:27 PM  Additional Follow-up for Phone Call Additional follow up Details #1::        still bloated and distended and has not had much defecation no pain, no fever, no vomiting I advised 1-2 Fleets enemas and possible dulcolax suppository tonight and she should call us back tomorrow with results to ED if severe pain, intractable vomiting, major bleeding, fever Additional Follow-up by: Iva Boop MD, Clementeen Graham,  November 02, 2009 5:28 PM    Additional Follow-up for Phone Call Additional follow up Details #2::    try to reschedule colo for next week Follow-up by: Louis Meckel MD,  November 03, 2009 9:46 AM  Additional Follow-up for Phone Call Additional follow up Details #3:: Details for Additional Follow-up Action Taken: The 1st available spot for a Colon w/propofol is 11-15-09 at 12:30pm at [facility].  Message left for patient to callback.Laureen Ochs LPN  November 03, 2009 10:28 AM  Message left for patient to callback.Laureen Ochs LPN  November 03, 2009 2:40 PM  Above MD orders reviewed with patient. Appt. information reviewed  w/pt. by phone and mailed to her. She will begin Miralax 1-2 times daily to prevent constipation. Pt. instructed to call back as needed.  Additional Follow-up by: Laureen Ochs LPN,  November 03, 2009 3:16 PM

## 2010-11-15 NOTE — Assessment & Plan Note (Signed)
Summary: F/U FROM APPT. W/AMY AND LABS DONE 06-21-10.    [name]   History of Present Illness Visit Type: Follow-up Visit Primary GI MD: Melvia Heaps MD [facility] Primary Provider: [name], MD Chief Complaint: pt is having rectal pain and bleeding, lots of pressure.  Pt was seen in ER on 9/4 History of Present Illness:   [name] has re turned for evaluation of abdominal pain.  She continues to complain of intermittent bloating with fairly diffuse pain that may radiate to her left lower extremity and left groin.  Pain may be spontaneous or worsened postprandially.  She complains of early satiety.  She take Goody's powders on a regular basis.  She moves her bowels regularly though, with pain, she may have the urge to defecate and will pass only mucus or occasionally blood.  CT from September  2011 again demonstrated soft tissue thickening around the aorta at the takeoff of the IMA.  Colonoscopy from February, 2011 was unremarkable.  Blood work is pertinent for a microcytic anemia.  CRP is +11.44.  Sedimentation rate is 28.  Fever.  She also takes oxycodone for back and abdominal pain.   GI Review of Systems    Reports abdominal pain.      Denies acid reflux, belching, bloating, chest pain, dysphagia with liquids, dysphagia with solids, heartburn, loss of appetite, nausea, vomiting, vomiting blood, weight loss, and  weight gain.        Denies anal fissure, black tarry stools, change in bowel habit, constipation, diarrhea, diverticulosis, fecal incontinence, heme positive stool, hemorrhoids, irritable bowel syndrome, jaundice, light color stool, liver problems, rectal bleeding, and  rectal pain.    Current Medications (verified): 1)  Xanax 1 Mg  Tabs (Alprazolam) .... Three Times A Day 2)  Promethazine Hcl 25 Mg  Tabs (Promethazine Hcl) .... Three Times A Day As Needed Nausea 3)  Adderall 30 Mg Tabs (Amphetamine-Dextroamphetamine) .... Three Times A Day 4)  Cymbalta 60 Mg Cpep (Duloxetine Hcl)  .... Once Daily 5)  Oxycodone Hcl 15 Mg Tabs (Oxycodone Hcl) .... One Four Times A Day As Needed Pain 6)  Omeprazole 40 Mg Cpdr (Omeprazole) .... Once Daily 7)  Maxalt-Mlt 10 Mg Tbdp (Rizatriptan Benzoate) .... As Needed For Ha  Allergies (verified): 1)  ! Penicillin G Potassium (Penicillin G Potassium) 2)  ! Darvocet 3)  Morphine  Past History:  Past Medical History: Reviewed history from 04/04/2010 and no changes required. Anemia-NOS Depression ADD Headaches Anxiety IBS chronic abdominal pain, sees Dr. Melvia Heaps and Sondra Come PA chronic low back pain sees Dr. Jarold Song for GYN exams  Past Surgical History: Reviewed history from 04/25/2010 and no changes required. PPH to tack up internal hemorrhoids 2004 per Dr. Luisa Hart colonoscopy 11-15-09 per Dr. Arlyce Dice, clear except for some hemorrhoids laparoscopic assisted vaginal hysterectomy with A and P repair 02-27-10 per Dr. Jarold Song  Family History: Reviewed history from 10/26/2009 and no changes required. Family History of Colon Polyps:Mother Family History of Heart Disease: Mother Family History of Irritable Bowel Syndrome:Mother  Social History: Reviewed history from 06/15/2010 and no changes required. Divorced unemployed,36 MONTH OLD CHILD AND AN 87 YEAR OLD,15 YO Patient currently smokes.  Alcohol Use - no Daily Caffeine Use -3 Illicit Drug Use - no  Review of Systems  The patient denies allergy/sinus, anemia, anxiety-new, arthritis/joint pain, back pain, blood in urine, breast changes/lumps, confusion, cough, coughing up blood, depression-new, fainting, fatigue, fever, headaches-new, hearing problems, heart murmur, heart rhythm changes, itching, menstrual pain, muscle pains/cramps,  night sweats, nosebleeds, pregnancy symptoms, shortness of breath, skin rash, sleeping problems, sore throat, swelling of feet/legs, swollen lymph glands, thirst - excessive, urination - excessive, urination changes/pain, urine  leakage, vision changes, and voice change.    Vital Signs:  Patient profile:   35 year old female Height:      67.5 inches Weight:      155 pounds BMI:     24.00 Pulse rate:   64 / minute Pulse rhythm:   regular BP sitting:   116 / 72  (left arm) Cuff size:   regular  Vitals Entered By: Francee Piccolo CMA Duncan Dull) (June 21, 2010 11:33 AM)  Physical Exam  Additional Exam:  On physical exam she is a well-developed well-nourished female  Abdomen is slightly distended.  There is minimal tenderness in the left periumbilical area and left lower quadrant.  There is no guarding or rebound.  There are no abdominal masses.   Impression & Recommendations:  Problem # 1:  ABDOMINAL PAIN-LLQ (ICD-789.04) She has vague abdominal pain which could be related to hypomotility.  She is taking NSAIDs on a regular basis which could be contributing to her upper GI symptoms.  CT findings certainly raise the question of retroperitoneal fibrosis though I do not think that this would be responsible for her symptoms at this time.  Recommendations #1 upper endoscopy #2 to consider gastric imaging scan if #1 is negative.  I would also consider a small bowel follow-through #3 check ANA and anti-smooth muscle antibodies #4 results of above she will require a followup CT scan #5 to consider course of promotility agent such as low-dose amitiza Orders: EGD (EGD)  Problem # 2:  RECTAL BLEEDING (ICD-569.3) This is probably hemorrhoidal.  Problem # 3:  ABNORMAL FINDINGS GI TRACT (ICD-793.4) See  assessment #1  Problem # 4:  OTHER ASCITES (ICD-789.59)  Patient Instructions: 1)  Copy sent to : [name], MD 2)  Your EGD is scheduled for 06/29/2010 at 1:30pm 3)  The medication list was reviewed and reconciled.  All changed / newly prescribed medications were explained.  A complete medication list was provided to the patient / caregiver.

## 2010-11-15 NOTE — Letter (Signed)
Summary: [facility] Instructions  [facility]  [address] [place], [place] [address]   Phone: [phone]  Fax: [phone]       [name]    14-Jun-1976    MRN: [id number]        Procedure Day /Date:11-01-09     Arrival Time:Noon     Procedure Time:2:00 PM     Location of Procedure:                     X     [facility] ( Outpatient Registration)   PREPARATION FOR COLONOSCOPY WITH MOVIPREP   Starting 5 days prior to your procedure 10-27-09 do not eat nuts, seeds, popcorn, corn, beans, peas,  salads, or any raw vegetables.  Do not take any fiber supplements (e.g. Metamucil, Citrucel, and Benefiber).  THE DAY BEFORE YOUR PROCEDURE         DATE:10-31-09  JJO:ACZYSAY  1.  Drink clear liquids the entire day-NO SOLID FOOD  2.  Do not drink anything colored red or purple.  Avoid juices with pulp.  No orange juice.  3.  Drink at least 64 oz. (8 glasses) of fluid/clear liquids during the day to prevent dehydration and help the prep work efficiently.  CLEAR LIQUIDS INCLUDE: Water Jello Ice Popsicles Tea (sugar ok, no milk/cream) Powdered fruit flavored drinks Coffee (sugar ok, no milk/cream) Gatorade Juice: apple, white grape, white cranberry  Lemonade Clear bullion, consomm, broth Carbonated beverages (any kind) Strained chicken noodle soup Hard Candy                             4.  In the morning, mix first dose of MoviPrep solution:    Empty 1 Pouch A and 1 Pouch B into the disposable container    Add lukewarm drinking water to the top line of the container. Mix to dissolve    Refrigerate (mixed solution should be used within 24 hrs)  5.  Begin drinking the prep at 5:00 p.m. The MoviPrep container is divided by 4 marks.   Every 15 minutes drink the solution down to the next mark (approximately 8 oz) until the full liter is complete.   6.  Follow completed prep with 16 oz of clear liquid of your choice (Nothing red or purple).  Continue  to drink clear liquids until bedtime.  7.  Before going to bed, mix second dose of MoviPrep solution:    Empty 1 Pouch A and 1 Pouch B into the disposable container    Add lukewarm drinking water to the top line of the container. Mix to dissolve    Refrigerate  THE DAY OF YOUR PROCEDURE      DATE: 11-01-09 DAY: Wednesday  Beginning at 9:00 AM (5 hours before procedure):         1. Every 15 minutes, drink the solution down to the next mark (approx 8 oz) until the full liter is complete.  2. Follow completed prep with 16 oz. of clear liquid of your choice.    3. You may drink clear liquids until 10:00 AM (4 HOURS BEFORE PROCEDURE).   MEDICATION INSTRUCTIONS  Unless otherwise instructed, you should take regular prescription medications with a small sip of water   as early as possible the morning of your procedure.        OTHER INSTRUCTIONS  You will need a responsible adult at least 33 years of age  to accompany you and drive you home.   This person must remain in the waiting room during your procedure.  Wear loose fitting clothing that is easily removed.  Leave jewelry and other valuables at home.  However, you may wish to bring a book to read or  an iPod/MP3 player to listen to music as you wait for your procedure to start.  Remove all body piercing jewelry and leave at home.  Total time from sign-in until discharge is approximately 2-3 hours.  You should go home directly after your procedure and rest.  You can resume normal activities the  day after your procedure.  The day of your procedure you should not:   Drive   Make legal decisions   Operate machinery   Drink alcohol   Return to work  You will receive specific instructions about eating, activities and medications before you leave.    The above instructions have been reviewed and explained to me by   _______________________    I fully understand and can verbalize these instructions  _____________________________ Date _________

## 2010-11-15 NOTE — Letter (Signed)
Summary: [facility]  [facility]   Imported By: Maryln Gottron 07/26/2010 09:53:11  _____________________________________________________________________  External Attachment:    Type:   Image     Comment:   External Document

## 2010-11-15 NOTE — Progress Notes (Signed)
Summary: REQUEST FOR REFILLS  Phone Note Call from Patient Call back at [phone]   Reason for Call: Refill Medication Summary of Call: [name] is in process of changing her insurance which will take anywhere from 15 to 30 days. Patients requests refill on the following medications until her new insurance takes effect.  Cymbalta 60mg  - Xanax 1mg  - Adderaill 30mg  - Oxycodone 15mg  - Dr Clent Ridges agreed for this one time the patient's request will be granted. Initial call taken by: Roney Jaffe,  June 12, 2010 12:27 PM  Follow-up for Phone Call        done for one month only  Follow-up by: Nelwyn Salisbury MD,  June 12, 2010 1:26 PM    New/Updated Medications: ADDERALL 30 MG TABS (AMPHETAMINE-DEXTROAMPHETAMINE) three times a day Prescriptions: CYMBALTA 60 MG CPEP (DULOXETINE HCL) once daily  #30 x 0   Entered and Authorized by:   Nelwyn Salisbury MD   Signed by:   Nelwyn Salisbury MD on 06/12/2010   Method used:   Print then Give to Patient   RxID:   [id number] ADDERALL 30 MG TABS (AMPHETAMINE-DEXTROAMPHETAMINE) three times a day  #90 x 0   Entered and Authorized by:   Nelwyn Salisbury MD   Signed by:   Nelwyn Salisbury MD on 06/12/2010   Method used:   Print then Give to Patient   RxID:   [id number] Prudy Feeler 1 MG  TABS (ALPRAZOLAM) three times a day  #90 x 0   Entered and Authorized by:   Nelwyn Salisbury MD   Signed by:   Nelwyn Salisbury MD on 06/12/2010   Method used:   Print then Give to Patient   RxID:   [id number] OXYCODONE HCL 15 MG TABS (OXYCODONE HCL) one four times a day as needed pain  #120 x 0   Entered and Authorized by:   Nelwyn Salisbury MD   Signed by:   Nelwyn Salisbury MD on 06/12/2010   Method used:   Print then Give to Patient   RxID:   [id number]

## 2010-11-15 NOTE — Progress Notes (Signed)
Summary: Bentyl  Phone Note Call from Patient Call back at Home Phone [phone]   Caller: Patient Call For: Willette Cluster Reason for Call: Talk to Nurse Summary of Call: pt says Bentyl was never called into her pharmacy, [facility] [address] [facility] Initial call taken by: Vallarie Mare,  November 07, 2009 3:59 PM    New/Updated Medications: BENTYL 10 MG CAPS (DICYCLOMINE HCL) Take 1 tab  Twice daily as needed for cramping and spasms Prescriptions: BENTYL 10 MG CAPS (DICYCLOMINE HCL) Take 1 tab  Twice daily as needed for cramping and spasms  #60 x 1   Entered by:   Lowry Ram NCMA   Authorized by:   Willette Cluster NP   Signed by:   Lowry Ram NCMA on 11/08/2009   Method used:   [facility]  [address] [phone][facility] (retail)       [address]       [place], [place]  [address]       Ph: [phone]       Fax: [phone]   RxID:   [id number]   Appended Document: Bentyl LM on pt's home phone that I sent the perscription for Bentyl to [facility] [address] at 12:15PM today 11-08-09.

## 2010-11-15 NOTE — Letter (Signed)
Summary: *HSN Results Follow up  [facility]  [address] [place], [place] [address]   Phone: [phone]  Fax: [phone]      10/23/2010   KAYLIN MARCON [address] [place], [place]  [address]   Dear  Ms. Sekai Ammar                          Comments: MS Romeo Apple I been trying to call you to schedule an appt with the couselour please, call our office at your earlist convinience to schedule an appt. Thank you        _________________________________________________________ If you have any questions, please contact our office                     Sincerely,  [name] [facility]

## 2010-11-15 NOTE — Letter (Signed)
Summary: Generic Letter  [facility]  [address] [place], [place] [address]   Phone: [phone]  Fax: [phone]    08/27/2010  [facility] [address] [place], [place]  [address]  Dear [name],  Your medications have been refilled today. Be advised as discussed during your initial visit you will be transitioned to specialist for your pain and mental health issues. [name] will get you an appointment with the [facility].  Additionally, you have been referred to the pain clinic.  However, the first clinic declined your case due to its complexity.  This office is working to get you referred to another pain clinic, however be advised this may be outside the city of [place].  As discussed, this provider is NOT agreeing to maintain your chronic narcotics of which you were previously prescribed for pain.    Your medications have been refilled for 1 month (November) and by this time you should be transitioned to both mental health and pain clinic.  Therefore no more refills on medications for these issues will be dispensed by this provider.  Sincerely,   Lehman Prom FNP

## 2010-11-15 NOTE — Progress Notes (Signed)
Summary: Appt with PA   Phone Note Outgoing Call   Call placed by: Joselyn Glassman,  October 24, 2009 10:50 AM Call placed to: Patient Summary of Call: Called pt's cell phone, [phone] and LM for her regarding her appt scheduled with [name] PA-C for 10-26-09.  I asked that she call me and let me know she got my message. I advised her to bring her ins card and med list and call us at least 24 hr in advance if she cannot make it. Initial call taken by: Joselyn Glassman,  October 24, 2009 10:55 AM  Follow-up for Phone Call        LM again for pt to advise we have her scheduled for appt with PA tomorrow 10-26-09.  I asked on theh message that she calls me to let me know she is aware of the appt. I left her know about the cancellation policy.  Follow-up by: Joselyn Glassman,  October 25, 2009 11:46 AM

## 2010-11-15 NOTE — Progress Notes (Signed)
Summary: new rx and rx for her lips  Phone Note Call from Patient Call back at Home Phone [phone]   Caller: Patient Call For: Nelwyn Salisbury MD Summary of Call: pt needs new rx for oxycodone 10 mg and also something for her cracked lips, pt was seen on 02-19-10 Initial call taken by: Heron Sabins,  Feb 22, 2010 2:41 PM  Follow-up for Phone Call        No more oxycocdone yet, she is using way too much of this. I suggest using Bert's Bees lip balm (OTC)  Follow-up by: Nelwyn Salisbury MD,  Feb 23, 2010 8:31 AM  Additional Follow-up for Phone Call Additional follow up Details #1::        attempt to call - ans mach - LMTCB if need - no more pain meds at this time per dr. Clent Ridges - too soon , if she feels she needs this , must be seen. Also instructed on lip balm. KIK Additional Follow-up by: Duard Brady LPN,  Feb 23, 2010 1:29 PM     Appended Document: new rx and rx for her lips Pt called back in and adv that she wants to be seen.... Pt scheduled for appt today at 3:30pm with Dr Clent Ridges...Marland KitchenMarland KitchenMarland Kitchen Dr Clent Ridges aware of same.

## 2010-11-15 NOTE — Progress Notes (Signed)
Summary: Xray Results  Phone Note Call from Patient Call back at Home Phone [phone]   Call For: [name], [place] Reason for Call: Lab or Test Results Summary of Call: Needs to know today so she can have her hysterectomy. Initial call taken by: Leanor Kail [facility],  January 10, 2010 1:32 PM  Follow-up for Phone Call        Spoke to pt and let her know what Dr. Arlyce Dice said about her CT done at [facility] on 12-25-09.  I let her know I faxed the CT done in Jan 2011 and this latest CT done on 12-25-09 to Dr. Lavina Hamman, today 01-10-10.  Follow-up by: Joselyn Glassman,  January 10, 2010 4:14 PM

## 2010-11-15 NOTE — Op Note (Signed)
Summary: PPH procedure for prolapsed hemorrhoids/[facility]  PPH procedure for prolapsed hemorrhoids/[facility]   Imported By: Sherian Rein 10/30/2009 08:48:26  _____________________________________________________________________  External Attachment:    Type:   Image     Comment:   External Document

## 2010-11-15 NOTE — Letter (Signed)
Summary: EGD Instructions  [facility]  [address] [place], [place] [address]   Phone: [phone]  Fax: [phone]       [name]    [birth year]    MRN: [id number]       Procedure Day /Date:FRIDAY 06/29/2010     Arrival Time: 12:30PM     Procedure Time:1:30PM     Location of Procedure:                    X  [facility] (4th Floor)   PREPARATION FOR ENDOSCOPY   On9/16/2011 THE DAY OF THE PROCEDURE:  1.   No solid foods, milk or milk products are allowed after midnight the night before your procedure.  2.   Do not drink anything colored red or purple.  Avoid juices with pulp.  No orange juice.  3.  You may drink clear liquids until11:30AM which is 2 hours before your procedure.                                                                                                CLEAR LIQUIDS INCLUDE: Water Jello Ice Popsicles Tea (sugar ok, no milk/cream) Powdered fruit flavored drinks Coffee (sugar ok, no milk/cream) Gatorade Juice: apple, white grape, white cranberry  Lemonade Clear bullion, consomm, broth Carbonated beverages (any kind) Strained chicken noodle soup Hard Candy   MEDICATION INSTRUCTIONS  Unless otherwise instructed, you should take regular prescription medications with a small sip of water as early as possible the morning of your procedure.            OTHER INSTRUCTIONS  You will need a responsible adult at least 33 years of age to accompany you and drive you home.   This person must remain in the waiting room during your procedure.  Wear loose fitting clothing that is easily removed.  Leave jewelry and other valuables at home.  However, you may wish to bring a book to read or an iPod/MP3 player to listen to music as you wait for your procedure to start.  Remove all body piercing jewelry and leave at home.  Total time from sign-in until discharge is approximately 2-3 hours.  You should go home directly  after your procedure and rest.  You can resume normal activities the day after your procedure.  The day of your procedure you should not:   Drive   Make legal decisions   Operate machinery   Drink alcohol   Return to work  You will receive specific instructions about eating, activities and medications before you leave.    The above instructions have been reviewed and explained to me by   _______________________    I fully understand and can verbalize these instructions _____________________________ Date _________

## 2010-11-15 NOTE — Progress Notes (Signed)
Summary: GI and Pain clinic referral  Phone Note Outgoing Call   Summary of Call: pt needs referrals to GI and pain clinic 1.  Refer to [facility] - she was dismissed from [facility] 2. Pain clinic - cancel the referral for [name] pain clinic (call them and let them know she will not need the appt - they have been trying to reach her).  [place] referred to either a pain clinic in [place] or [place]   Initial call taken by: Lehman Prom FNP,  September 27, 2010 3:26 PM  Follow-up for Phone Call        I call [name]  and pt is been dismissed from there she need to contact billing . * I send the referral to [facility] in [place]  waiting for an answer   please call pt and let her know about [facility] and billing issue.  she is already dismissed from [facility] so unsure of her options I call her but her mailbox is full I will try tomorrow.[name]  September 27, 2010 5:33 PM  [email or web address],fnp  September 27, 2010  5:06 PM   Follow-up by: [name],  September 27, 2010 4:54 PM  Additional Follow-up for Phone Call Additional follow up Details #1::        i call pain clinic to canceled her referral I call pt and mailbox still full .Marland KitchenCheryll Dessert  September 28, 2010 9:52 AM I MAILED A LETTER TODAY.Marland KitchenCheryll Dessert  October 01, 2010 3:37 PM

## 2010-11-15 NOTE — Progress Notes (Signed)
Summary: Labs/REV Scheduled  ---- Converted from flag ---- ---- 06/20/2010 4:23 PM, Zyasia Halbleib S Barnes Florek PA-c wrote: Lynnea Maizes CALL THIS PT AND GET HER ON DR. KAPLAN 'S SCHEDULE FOR THIS WEEK-PER R.K.       THANKS  ---- 06/20/2010 4:18 PM, Louis Meckel MD wrote: I have only seen her for colonoscopy.  Please have her make an app't for possible tomorrow.  ---- 06/20/2010 4:02 PM, Macarthur Lorusso Oswald Hillock PA-c wrote: OK, I AM UNCOMFORTABLE  IGNORING THE PARAAORTIC THING  ---- 06/20/2010 3:57 PM, Louis Meckel MD wrote: Please refer to Dr. 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  I REALLY DO NOT KNOW WHAT ELSE TO DO FOR HER. WE NEED TO ADDRESS THE PERIAORTIC  AREA AS WELL. SHOULD SHE  HAVE A SBFT ??     MULTIPLE ER VISITS  AS WELL. ------------------------------  Phone Note Outgoing Call Call back at [facility] Phone [phone] Call back at Work Phone [phone]   Call placed by: Laureen Ochs LPN,  June 20, 2010 4:33 PM Call placed to: Patient  Follow-up for Phone Call        Pt. will have labs drawn in the morning and see Dr.Kaplan on 06-21-10 at 11:15am. Pt. instructed to call back as needed.  Follow-up by: Laureen Ochs LPN,  June 20, 2010 4:39 PM

## 2010-11-15 NOTE — Progress Notes (Signed)
Summary: [facility] Admit  Phone Note Outgoing Call   Call placed by: Joselyn Glassman,  July 17, 2010 9:07 AM Call placed to: Patient Summary of Call: Per [name] Pa-C, I was to call the pt and firnd out what hospital she wants to be admitted to.  ( She had called our on call Dr Melvia Heaps   the AM with ABD pain,  severe nausea and vomiting). I called bed placement and was told to have the pt go to admitting at [facility] this morning.  I called the pt back and gave her that information and thanked [place] for our attention to he needs. Initial call taken by: Joselyn Glassman,  July 17, 2010 9:09 AM     Appended Document: Hosp Admit phone call from [place] at [place] to clarify orders.  I advised her that the pt has been admitted.  CT appt cancelled.

## 2010-11-15 NOTE — Progress Notes (Signed)
Summary: pt lost rx for Adderall  Phone Note Call from Patient Call back at Home Phone [phone]   Caller: [name]--FRIEND Reason for Call: Refill Medication Summary of Call: Caller is calling on behalf of the patient. She stated that she lost her rx for Adderall. She is requesting additional refills.  Initial call taken by: Warnell Forester,  Feb 16, 2010 3:09 PM  Follow-up for Phone Call        okay, but I will only give her a refill for one month at a time. She needs to be more careful with these.  Follow-up by: Nelwyn Salisbury MD,  Feb 16, 2010 3:39 PM    New/Updated Medications: ADDERALL 30 MG TABS (AMPHETAMINE-DEXTROAMPHETAMINE) three times a day Prescriptions: ADDERALL 30 MG TABS (AMPHETAMINE-DEXTROAMPHETAMINE) three times a day  #90 x 0   Entered and Authorized by:   Nelwyn Salisbury MD   Signed by:   Nelwyn Salisbury MD on 02/16/2010   Method used:   Print then Give to Patient   RxID:   [id number]

## 2010-11-15 NOTE — Progress Notes (Signed)
Summary: refill xanax  Phone Note Refill Request Message from:  Fax from Pharmacy on April 24, 2010 11:51 AM  Refills Requested: Medication #1:  XANAX 1 MG  TABS three times a day   Last Refilled: 03/04/2010 Lorazepam not working   Method Requested: Fax to Local Pharmacy Initial call taken by: Raechel Ache, RN,  April 24, 2010 11:51 AM Caller: [facility]  [address] [phone]  Follow-up for Phone Call        will do at her OV today Follow-up by: Nelwyn Salisbury MD,  April 25, 2010 8:25 AM

## 2010-11-15 NOTE — Progress Notes (Signed)
Summary: refill  Phone Note Refill Request Call back at [facility] Phone [phone] Message from:  Patient---live caLL  Refills Requested: Medication #1:  OXYCODONE HCL 15 MG TABS one four times a day as needed pain. Had temporary rx that was written last week for 10 by Dr Fabian Sharp. She stated that she is on 15 mg qid.  Initial call taken by: Warnell Forester,  March 29, 2010 10:38 AM  Follow-up for Phone Call        we did this yesterday, call her to pick it up Follow-up by: Nelwyn Salisbury MD,  March 29, 2010 12:02 PM  Additional Follow-up for Phone Call Additional follow up Details #1::        called Additional Follow-up by: Raechel Ache, RN,  March 29, 2010 12:17 PM

## 2010-11-15 NOTE — Progress Notes (Signed)
Summary: Pain Clinic   Phone Note Outgoing Call   Summary of Call: I CALL [facility] IN [place] TO F/U THE PAIN REFERRAL AND THEY DENIED THEY DIDN'T GIVE ME A REASON .AND ON FRIDAY MS [place] CALL ME AND I TALK TO HER ABOUT GI REFERRAL SHE S SUPPOSE TO CALL ME TODAY TO GIVE ME A PLACE THAT HER MOM WHEN . I DON'T KNOW WHAT TO DO ABOUT HER PAIN REFERRAL IF SHE CALL ME I WILL NOTIFY HER .  Initial call taken by: [name],  October 16, 2010 11:27 AM  Follow-up for Phone Call        What other choices are there regarding pain clinics? i have expressed to pt that as a courtesy we are trying to find her a pain clinic.   I have made it very clear that this office will not be prescribing her long term pain meds so she may have to be active to find an office that will accept her and let you know. Follow-up by: Lehman Prom FNP,  October 24, 2010 8:38 AM  Additional Follow-up for Phone Call Additional follow up Details #1::        We don't have any choices but pt called yesterday to the pain clinic on [place] that they told me she denied  and they told her that her case still in reviewed so she will let me know when she got an appt . Additional Follow-up by: [name],  October 24, 2010 9:12 AM    Additional Follow-up for Phone Call Additional follow up Details #2::    Ok. i hope she gets in Follow-up by: Lehman Prom FNP,  October 25, 2010 5:17 PM

## 2010-11-15 NOTE — Procedures (Signed)
Summary: Colonoscopy  Patient: [name] Note: All result statuses are Final unless otherwise noted.  Tests: (1) Colonoscopy (COL)   COL Colonoscopy           DONE     [facility]     [address] [place], [place]  [address]           COLONOSCOPY PROCEDURE REPORT           PATIENT:  [name], [name]  MR#:  [id number]     BIRTHDATE:  June 17, 1976, 33 yrs. old  GENDER:  female           ENDOSCOPIST:  Barbette Hair. Arlyce Dice, MD     Referred by:           PROCEDURE DATE:  11/15/2009     PROCEDURE:  Colonoscopy, Diagnostic     ASA CLASS:  Class I     INDICATIONS:  rectal bleeding           MEDICATIONS:   MAC sedation, administered by CRNA           DESCRIPTION OF PROCEDURE:   After the risks benefits and     alternatives of the procedure were thoroughly explained, informed     consent was obtained.  Digital rectal exam was performed and     revealed no abnormalities.   The  endoscope was introduced through     the anus and advanced to the cecum, which was identified by both     the appendix and ileocecal valve, without limitations.  The     quality of the prep was excellent, using MoviPrep.  The instrument     was then slowly withdrawn as the colon was fully examined.     <<PROCEDUREIMAGES>>           FINDINGS:  A normal appearing cecum, ileocecal valve, and     appendiceal orifice were identified. The ascending, hepatic     flexure, transverse, splenic flexure, descending, sigmoid colon,     and rectum appeared unremarkable (see image1, image2, image3,     image4, image5, image6, image7, image9, image10, image12, image14,     image15, and image16).   Retroflexed views in the rectum revealed     no abnormalities.    The scope was then withdrawn from the patient     and the procedure completed.           COMPLICATIONS:  None           ENDOSCOPIC IMPRESSION:     1) Normal colon     2) Limited rectal bleeding secondary to hemorrhoids     3) Nonspecific abdominal pain          RECOMMENDATIONS:     1) no further GI workup     2) f/u CT scan 4/11 for question of soft tissue abnormality     3) pt to return to primary provider - Dr. Clent Ridges           REPEAT EXAM:  No           ______________________________     Barbette Hair. Arlyce Dice, MD           CC:  Danielle Salisbury, MD     The patient           n.     [name]:   Barbette Hair. Kaplan at 11/15/2009 01:19 PM  [name], [name], [id number]  Note: An exclamation mark (!) indicates a result that was not dispersed into the flowsheet. Document Creation Date: 11/15/2009 1:19 PM _______________________________________________________________________  (1) Order result status: Final Collection or observation date-time: 11/15/2009 13:01 Requested date-time:  Receipt date-time:  Reported date-time:  Referring Physician:   Ordering Physician: Melvia Heaps [id number]) Specimen Source:  Source: Launa Grill Order Number: [id number] Lab site:

## 2010-11-15 NOTE — Letter (Signed)
Summary: Discharge Summary  Discharge Summary   Imported By: Arta Bruce 11/01/2010 11:29:34  _____________________________________________________________________  External Attachment:    Type:   Image     Comment:   External Document

## 2010-11-15 NOTE — Letter (Signed)
Summary: PT INFORMATION SHEET  PT INFORMATION SHEET   Imported By: Arta Bruce 08/13/2010 10:53:07  _____________________________________________________________________  External Attachment:    Type:   Image     Comment:   External Document

## 2010-11-15 NOTE — Progress Notes (Signed)
Summary: IBS flare up  Phone Note Call from Patient Call back at Home Phone [phone]   Call For: Dr Arlyce Dice Reason for Call: Talk to Nurse Summary of Call: IBS Flare up Initial call taken by: Leanor Kail [facility],  August 14, 2010 3:36 PM  Follow-up for Phone Call        Patient advised she has been discharged from our practice and we can provide her emergency care until Nov 19th. Darcey Nora RN, [facility]  August 14, 2010 3:49 PM

## 2010-11-15 NOTE — Progress Notes (Signed)
Summary: Dismissal Letter Sent by Certified Mail  Dismissal Letter sent by certified mail. Vara Guardian  August 01, 2010 11:43 AM

## 2010-11-15 NOTE — Assessment & Plan Note (Signed)
Summary: RECTAL BLEEDING,HEADACHES, NEAR PASSING OUT,EPRESSION/PS   Vital Signs:  Patient profile:   35 year old female Weight:      151 pounds BMI:     23.39 Temp:     98.3 degrees F oral BP sitting:   112 / 68  (left arm) Cuff size:   regular  Vitals Entered By: Raechel Ache, RN (April 25, 2010 3:12 PM) CC: C/o rectal pain, passing out and depression.   History of Present Illness: Here with her husband for continued problems with rectal prolapse, rectal bleeding, incontinence of mucus from the rectum, diffuse abdominal pain and bloating, HAs, and depression. She was in the ER a few days ago, and was told that there is obvious rectal prolapse. This was supposed to have been corrected in May when she had her hysterectomy, but this apparently was not successful. because she feels so bad and is in constant pain, this has prevented her from doing much at home, and she feels like she is letting her family down. She is very depressed and cries almost daily. She has had brief suicidal thoughts, but she denies any intent ot harm herself and she says that her love for her family would prevent her from ever harming herself.   Allergies: 1)  ! Penicillin G Potassium (Penicillin G Potassium) 2)  ! Darvocet  Past History:  Past Medical History: Reviewed history from 04/04/2010 and no changes required. Anemia-NOS Depression ADD Headaches Anxiety IBS chronic abdominal pain, sees Dr. Melvia Heaps and Sondra Come PA chronic low back pain sees Dr. Jarold Song for GYN exams  Past Surgical History: PPH to tack up internal hemorrhoids 2004 per Dr. Luisa Hart colonoscopy 11-15-09 per Dr. Arlyce Dice, clear except for some hemorrhoids laparoscopic assisted vaginal hysterectomy with A and P repair 02-27-10 per Dr. Jarold Song  Review of Systems  The patient denies anorexia, fever, weight loss, weight gain, vision loss, decreased hearing, hoarseness, chest pain, syncope, dyspnea on exertion, peripheral  edema, prolonged cough, hemoptysis, melena, hematochezia, severe indigestion/heartburn, hematuria, incontinence, genital sores, muscle weakness, suspicious skin lesions, transient blindness, difficulty walking, unusual weight change, abnormal bleeding, enlarged lymph nodes, angioedema, breast masses, and testicular masses.    Physical Exam  General:  in pain, crying, lying on the exam table Neck:  No deformities, masses, or tenderness noted. Lungs:  Normal respiratory effort, chest expands symmetrically. Lungs are clear to auscultation, no crackles or wheezes. Heart:  Normal rate and regular rhythm. S1 and S2 normal without gallop, murmur, click, rub or other extra sounds. Abdomen:  soft, normal bowel sounds, no distention, no masses, no guarding, no rigidity, no rebound tenderness, no abdominal hernia, no inguinal hernia, no hepatomegaly, and no splenomegaly.  Mildly tender diffusely.  Neurologic:  alert & oriented X3, cranial nerves II-XII intact, and gait normal.   Psych:  Oriented X3, memory intact for recent and remote, normally interactive, good eye contact, depressed affect, and tearful.     Impression & Recommendations:  Problem # 1:  LOW BACK PAIN SYNDROME (ICD-724.2)  Her updated medication list for this problem includes:    Oxycodone Hcl 15 Mg Tabs (Oxycodone hcl) ..... One four times a day as needed pain  Orders: Demerol  100mg   Injection (N5621) Promethazine up to 50mg  (J2550) Admin of Therapeutic Inj  intramuscular or subcutaneous (30865) Depo-Medrol 20mg  (J1020)  Problem # 2:  RECTAL BLEEDING (ICD-569.3)  Problem # 3:  ABDOMINAL PAIN, GENERALIZED (ICD-789.07)  Problem # 4:  DEPRESSION (ICD-311)  The following medications were removed  from the medication list:    Ativan 1 Mg Tabs (Lorazepam) .Marland Kitchen... Three times a day as needed anxiety Her updated medication list for this problem includes:    Xanax 1 Mg Tabs (Alprazolam) .Marland Kitchen... Three times a day    Cymbalta 60 Mg Cpep  (Duloxetine hcl) ..... Once daily  Problem # 5:  HEADACHE (ICD-784.0)  The following medications were removed from the medication list:    Maxalt 10 Mg Tabs (Rizatriptan benzoate) .Marland Kitchen... As needed for migraines Her updated medication list for this problem includes:    Oxycodone Hcl 15 Mg Tabs (Oxycodone hcl) ..... One four times a day as needed pain    Maxalt-mlt 10 Mg Tbdp (Rizatriptan benzoate) .Marland Kitchen... As needed for ha  Complete Medication List: 1)  Xanax 1 Mg Tabs (Alprazolam) .... Three times a day 2)  Promethazine Hcl 25 Mg Tabs (Promethazine hcl) .... Three times a day as needed nausea 3)  Adderall 30 Mg Tabs (Amphetamine-dextroamphetamine) .... Three times a day, may fill on 05-16-10 4)  Cymbalta 60 Mg Cpep (Duloxetine hcl) .... Once daily 5)  Oxycodone Hcl 15 Mg Tabs (Oxycodone hcl) .... One four times a day as needed pain 6)  Dicyclomine Hcl 20 Mg Tabs (Dicyclomine hcl) .... Three times a day as needed stomach cramps 7)  Omeprazole 40 Mg Cpdr (Omeprazole) .... Once daily 8)  Maxalt-mlt 10 Mg Tbdp (Rizatriptan benzoate) .... As needed for ha 9)  Seroquel 50 Mg Tabs (Quetiapine fumarate) .... At bedtime  Patient Instructions: 1)  Given shots today for her migraine HA. Will add Seroquel to her Cymbalta to help with mood swings and depression. Refilled her pain and anxiety meds. She needs to see a colorectal surgeon to address the prolapse, and she prefers to go to [facility] for this. we will set this up ASAP  Prescriptions: SEROQUEL 50 MG TABS (QUETIAPINE FUMARATE) at bedtime  #30 x 2   Entered and Authorized by:   Nelwyn Salisbury MD   Signed by:   Nelwyn Salisbury MD on 04/25/2010   Method used:   Print then Give to Patient   RxID:   [id number] MAXALT-MLT 10 MG TBDP (RIZATRIPTAN BENZOATE) as needed for HA  #12 x 11   Entered and Authorized by:   Nelwyn Salisbury MD   Signed by:   Nelwyn Salisbury MD on 04/25/2010   Method used:   Print then Give to Patient   RxID:    [id number] OXYCODONE HCL 15 MG TABS (OXYCODONE HCL) one four times a day as needed pain  #120 x 0   Entered and Authorized by:   Nelwyn Salisbury MD   Signed by:   Nelwyn Salisbury MD on 04/25/2010   Method used:   Print then Give to Patient   RxID:   [id number] Prudy Feeler 1 MG  TABS (ALPRAZOLAM) three times a day  #90 x 5   Entered and Authorized by:   Nelwyn Salisbury MD   Signed by:   Nelwyn Salisbury MD on 04/25/2010   Method used:   Print then Give to Patient   RxID:   [id number]    Medication Administration  Injection # 1:    Medication: Demerol  100mg   Injection    Diagnosis: LOW BACK PAIN SYNDROME (ICD-724.2)    Route: IM    Site: RUOQ gluteus    Exp Date: 12/13/2010    Lot #: [id number]    Mfr: hospira    Comments: 100mg  given  Patient tolerated injection without complications    Given by: Raechel Ache, RN (April 25, 2010 4:38 PM)  Injection # 2:    Medication: Promethazine up to 50mg     Diagnosis: LOW BACK PAIN SYNDROME (ICD-724.2)    Route: IM    Site: RUOQ gluteus    Exp Date: 11/12    Lot #: [id number]    Mfr: novaplus    Comments: 50mg  given    Patient tolerated injection without complications    Given by: Raechel Ache, RN (April 25, 2010 4:39 PM)  Injection # 3:    Medication: Depo-Medrol 20mg     Diagnosis: LOW BACK PAIN SYNDROME (ICD-724.2)    Route: IM    Site: R deltoid    Exp Date: 04/14    Lot #: [id number]    Mfr: Pharmacia    Comments: 60mg  given    Patient tolerated injection without complications    Given by: Raechel Ache, RN (April 25, 2010 4:40 PM)  Orders Added: 1)  Demerol  100mg   Injection [J2175] 2)  Promethazine up to 50mg  [J2550] 3)  Admin of Therapeutic Inj  intramuscular or subcutaneous [96372] 4)  Depo-Medrol 20mg  [J1020] 5)  Est. Patient Level IV [91478]

## 2010-11-15 NOTE — Progress Notes (Signed)
Summary: Requesting pain meds  Phone Note Call from Patient   Summary of Call: pt came by the office to pick up narcotic rx. advised pt that rx was not ready would let provider know. she says you can call her when its ready at [phone]  and her significant other can pick up([name]). Initial call taken by: Hassell Halim, CMA  July 27, 2010 10:50 AM  Follow-up for Phone Call        notify pt that her medication is ready for pick up also advise pt that she will be transfered to the pain clinic for future refills she will be notified of the time/date of the appt Follow-up by: Lehman Prom FNP,  July 27, 2010 1:37 PM  Additional Follow-up for Phone Call Additional follow up Details #1::        RX WAS PICKED UP. Additional Follow-up by: Leodis Rains,  July 27, 2010 4:58 PM

## 2010-11-15 NOTE — Assessment & Plan Note (Signed)
Summary: BACK PAIN // RS   Vital Signs:  Patient profile:   35 year old female Weight:      159 pounds Temp:     98.0 degrees F oral BP sitting:   124 / 82  (right arm) Cuff size:   regular  Vitals Entered By: Duard Brady LPN (Feb 23, 2010 4:13 PM) CC: pain med refill Is Patient Diabetic? No   History of Present Illness: Here for med refills. She has been averaging 60 mg of oxycodone a day for back pain. Her surgery is set for 02-27-10. Otherwise she is about the same as usual.   Allergies: 1)  ! Penicillin G Potassium (Penicillin G Potassium) 2)  ! Darvocet  Past History:  Past Medical History: Reviewed history from 02/19/2010 and no changes required. Anemia-NOS Depression ADD Headaches Anxiety IBS chronic abdominal pain chronic low back pain  Review of Systems  The patient denies anorexia, fever, weight loss, weight gain, vision loss, decreased hearing, hoarseness, chest pain, syncope, dyspnea on exertion, peripheral edema, prolonged cough, headaches, hemoptysis, abdominal pain, melena, hematochezia, severe indigestion/heartburn, hematuria, incontinence, genital sores, muscle weakness, suspicious skin lesions, transient blindness, difficulty walking, depression, unusual weight change, abnormal bleeding, enlarged lymph nodes, angioedema, breast masses, and testicular masses.    Physical Exam  General:  Well-developed,well-nourished,in no acute distress; alert,appropriate and cooperative throughout examination Lungs:  Normal respiratory effort, chest expands symmetrically. Lungs are clear to auscultation, no crackles or wheezes. Heart:  Normal rate and regular rhythm. S1 and S2 normal without gallop, murmur, click, rub or other extra sounds. Abdomen:  Bowel sounds positive,abdomen soft and non-tender without masses, organomegaly or hernias noted.   Impression & Recommendations:  Problem # 1:  LOW BACK PAIN SYNDROME (ICD-724.2)  Her updated medication list for  this problem includes:    Oxycodone Hcl 10 Mg Tabs (Oxycodone hcl) .Marland Kitchen... 1 q 4  hours as needed pain  Problem # 2:  ABDOMINAL PAIN-LLQ (ICD-789.04)  Problem # 3:  HEADACHE (ICD-784.0)  Her updated medication list for this problem includes:    Oxycodone Hcl 10 Mg Tabs (Oxycodone hcl) .Marland Kitchen... 1 q 4  hours as needed pain    Maxalt 10 Mg Tabs (Rizatriptan benzoate) .Marland Kitchen... As needed for migraines  Complete Medication List: 1)  Xanax 1 Mg Tabs (Alprazolam) .... Three times a day 2)  Promethazine Hcl 25 Mg Tabs (Promethazine hcl) .... Three times a day as needed nausea 3)  Adderall 30 Mg Tabs (Amphetamine-dextroamphetamine) .... Three times a day 4)  Oxycodone Hcl 10 Mg Tabs (Oxycodone hcl) .Marland Kitchen.. 1 q 4  hours as needed pain 5)  Maxalt 10 Mg Tabs (Rizatriptan benzoate) .... As needed for migraines 6)  Cymbalta 30 Mg Cpep (Duloxetine hcl) .Marland Kitchen.. 1 once daily  Patient Instructions: 1)  We will see her after the surgery. Prescriptions: OXYCODONE HCL 10 MG TABS (OXYCODONE HCL) 1 q 4  hours as needed pain  #180 x 0   Entered and Authorized by:   Nelwyn Salisbury MD   Signed by:   Nelwyn Salisbury MD on 02/23/2010   Method used:   Print then Give to Patient   RxID:   [id number]

## 2010-11-15 NOTE — Progress Notes (Signed)
Summary: MD SWITCH REQUEST DENIED  Phone Note From Other Clinic Call back at Home Phone [phone] Call back at Work Phone [phone]   Call For: Dr Arlyce Dice Summary of Call: Wants to switch from Dr Arlyce Dice because she says they "didnt click" and she feels she was put on the backburner. Would like to see any other GI Dr in our group. Initial call taken by: Leanor Kail [facility],  April 10, 2010 8:38 AM  Follow-up for Phone Call        Pt. had a Colonoscopy by Dr.Hayes on 05-24-2003. Saw [name] PAC as a new pt. on 10-26-09 and had a Colonoscopy w/Dr.Bartosz Luginbill on 11-15-09. Pt. not seen since.  O.K per Dr.Yavier Snider. DR.PATTERSON-(Doc of the Day) Will you accept this patient?  Follow-up by: Laureen Ochs LPN,  April 10, 2010 9:19 AM  Additional Follow-up for Phone Call Additional follow up Details #1::        chart says she has an appt. with [name] KitchenMarland KitchenI am not interested in taking over... Additional Follow-up by: [name] MD FACG,  April 10, 2010 10:37 AM    Additional Follow-up for Phone Call Additional follow up Details #2::    Per note, Dr.Hung doesn't accept pt's insurance.  DR.PERRY (DOD) will you accept this patient? Laureen Ochs LPN  April 11, 2010 2:41 PM   Unfortunately, I am unable to accommodate new patients with established gastroenterologists, at this time.   Follow-up by: [name] MD,  April 11, 2010 2:57 PM  Additional Follow-up for Phone Call Additional follow up Details #3:: Details for Additional Follow-up Action Taken: Msg. left for pt. that she remains a patient of Dr.[name]. Pt. instructed to call back as needed.  Additional Follow-up by: Laureen Ochs LPN,  April 11, 2010 3:10 PM

## 2010-11-15 NOTE — Assessment & Plan Note (Signed)
Summary: F/U ON STOMACH / BACK ISSUES // RS   Vital Signs:  Patient profile:   35 year old female BP sitting:   118 / 78  (left arm) Cuff size:   regular  Vitals Entered By: Raechel Ache, RN (March 16, 2010 2:09 PM) CC: F/u- went to ER on Monday with dehydration.    History of Present Illness: Here to follow up after a hysterectomy on 02-27-10. She has done quite well with her recovery. Her pain levels are down and she is managing fairly well. Her depression has acted up a bit however, and she would like to increse the Cymbalta dose if possible. She is due to see Dr. Elnoria Howard soon for a GI evaluation.   Allergies: 1)  ! Penicillin G Potassium (Penicillin G Potassium) 2)  ! Darvocet  Past History:  Past Medical History: Anemia-NOS Depression ADD Headaches Anxiety IBS chronic abdominal pain chronic low back pain sees Dr. Jarold Song for GYN exams  Past Surgical History: [facility] colonoscopy 11-15-09 per Dr. Arlyce Dice, clear except for some hemorrhoids internal hemorrhoid surgery laparoscopic assisted vaginal hysterectomy with A and P repair 02-27-10 per Dr. Jarold Song  Review of Systems  The patient denies anorexia, fever, weight loss, weight gain, vision loss, decreased hearing, hoarseness, chest pain, syncope, dyspnea on exertion, peripheral edema, prolonged cough, headaches, hemoptysis, abdominal pain, melena, hematochezia, severe indigestion/heartburn, hematuria, incontinence, genital sores, muscle weakness, suspicious skin lesions, transient blindness, difficulty walking, unusual weight change, abnormal bleeding, enlarged lymph nodes, angioedema, breast masses, and testicular masses.    Physical Exam  General:  Well-developed,well-nourished,in no acute distress; alert,appropriate and cooperative throughout examination Lungs:  Normal respiratory effort, chest expands symmetrically. Lungs are clear to auscultation, no crackles or wheezes. Heart:  Normal rate and regular rhythm. S1 and  S2 normal without gallop, murmur, click, rub or other extra sounds. Abdomen:  Bowel sounds positive,abdomen soft and non-tender without masses, organomegaly or hernias noted. Psych:  Cognition and judgment appear intact. Alert and cooperative with normal attention span and concentration. No apparent delusions, illusions, hallucinations   Impression & Recommendations:  Problem # 1:  LOW BACK PAIN SYNDROME (ICD-724.2)  Her updated medication list for this problem includes:    Oxycodone Hcl 10 Mg Tabs (Oxycodone hcl) .Marland Kitchen... 1 q 4  hours as needed pain  Problem # 2:  ABDOMINAL PAIN, GENERALIZED (ICD-789.07)  Problem # 3:  DEPRESSION (ICD-311)  The following medications were removed from the medication list:    Cymbalta 30 Mg Cpep (Duloxetine hcl) .Marland Kitchen... 1 once daily Her updated medication list for this problem includes:    Xanax 1 Mg Tabs (Alprazolam) .Marland Kitchen... Three times a day    Cymbalta 60 Mg Cpep (Duloxetine hcl) ..... Once daily  Problem # 4:  ADD (ICD-314.00)  Complete Medication List: 1)  Xanax 1 Mg Tabs (Alprazolam) .... Three times a day 2)  Promethazine Hcl 25 Mg Tabs (Promethazine hcl) .... Three times a day as needed nausea 3)  Adderall 30 Mg Tabs (Amphetamine-dextroamphetamine) .... Three times a day, may fill on 05-16-10 4)  Oxycodone Hcl 10 Mg Tabs (Oxycodone hcl) .Marland Kitchen.. 1 q 4  hours as needed pain 5)  Maxalt 10 Mg Tabs (Rizatriptan benzoate) .... As needed for migraines 6)  Cymbalta 60 Mg Cpep (Duloxetine hcl) .... Once daily  Patient Instructions: 1)  Increase Cymbalta to 60mg  a day.  2)  Please schedule a follow-up appointment in 1 month.  Prescriptions: ADDERALL 30 MG TABS (AMPHETAMINE-DEXTROAMPHETAMINE) three times a day, may  fill on 05-16-10  #90 x 0   Entered and Authorized by:   Nelwyn Salisbury MD   Signed by:   Nelwyn Salisbury MD on 03/16/2010   Method used:   Print then Give to Patient   RxID:   [id number] ADDERALL 30 MG TABS (AMPHETAMINE-DEXTROAMPHETAMINE) three  times a day, may fill on 04-15-10  #90 x 0   Entered and Authorized by:   Nelwyn Salisbury MD   Signed by:   Nelwyn Salisbury MD on 03/16/2010   Method used:   Print then Give to Patient   RxID:   [id number] ADDERALL 30 MG TABS (AMPHETAMINE-DEXTROAMPHETAMINE) three times a day  #90 x 0   Entered and Authorized by:   Nelwyn Salisbury MD   Signed by:   Nelwyn Salisbury MD on 03/16/2010   Method used:   Print then Give to Patient   RxID:   [id number]

## 2010-11-15 NOTE — Progress Notes (Signed)
Summary: STATUS OF DISABILITY PAPERWORK?  Phone Note Call from Patient   Caller: Patient   [phone] Summary of Call: Pt called to ck on status of her disability paperwork.... She adv that someone was supposed to call her to let her know when the paperwork would be completed but she hasn't heard from anyone.... Pt is currently out of state and can be reached at [phone].... or Jill Alexanders (boyfriend) at  [phone].  Initial call taken by: Debbra Riding,  September 05, 2010 11:56 AM  Follow-up for Phone Call        I do not know what she is talking about. If this is about records, ask Medical Records.  Follow-up by: Nelwyn Salisbury MD,  September 05, 2010 1:13 PM  Additional Follow-up for Phone Call Additional follow up Details #1::        Tim Lair, I spoke with pt and she states that she spoke to you when she dropped the paperwork off..... Do we have her paperwork?  Additional Follow-up by: Debbra Riding,  September 05, 2010 1:58 PM    Additional Follow-up for Phone Call Additional follow up Details #2::    This was to certify her disability, if I remember, and I told her then that I cannot do this because I am no longer her physician. She needs her new doctor to do this.  Follow-up by: Nelwyn Salisbury MD,  September 05, 2010 2:10 PM  Additional Follow-up for Phone Call Additional follow up Details #3:: Details for Additional Follow-up Action Taken: states she has been pt here for 9 yrs and wants papers completed and will contac her lawyer informed pt we will be more than glad to release her records. per dr fry he has not been her dr for months and pt verabalized understanding  Additional Follow-up by: Pura Spice, RN,  September 05, 2010 2:56 PM

## 2010-11-15 NOTE — Cardiovascular Report (Signed)
Summary: Certified Letter Returned - Unable to [employer] Letter Returned - Unable to [employer]   Imported By: Debby Freiberg 08/28/2010 11:17:46  _____________________________________________________________________  External Attachment:    Type:   Image     Comment:   External Document

## 2010-11-15 NOTE — Progress Notes (Signed)
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 She called 04-10-10 to request an MD switch because she felt her and Dr.Kaplan "Didn't click" and stated she felt he put her on the back burner. Her request was approved by Dr.Kaplan but declined by 2 other MD's, so I advised her she will remain as Dr.Kaplans pt. her at [facility].  On 04-23-10, in a call to her PCP she makes more derogatory remarks about Dr.Kaplan and requests to be referred to another GI MD.)  Today pt. is calling with C/O of constant left side pain, extreme bloating, "I look 6-7 monthes pregnant"  constant nausea, loose stools, "I have blood and black stools."  Pt. wants to be seen ASAP. Pt. will see [name] PAC on 06-15-10 at 3:30pm.  **AMY--Does she need any labs prior to appt?     Follow-up by: Laureen Ochs LPN,  June 14, 2010 12:46 PM  Additional Follow-up for Phone Call Additional follow up Details #1::        PLEASE HAVE HER GET A CBC BEFORE HER APPT TODAY Additional Follow-up by: Sammuel Cooper PA-c,  June 15, 2010 1:15 PM     Appended Document: Triage-Pain, Blood, Black Stools Left message for pt. to arrive early and get labs done prior to appt.

## 2010-11-15 NOTE — Letter (Signed)
Summary: Generic Letter  [facility]  [address] [place], [place] [address]   Phone: [phone]  Fax: [phone]        09/04/2010  [facility] [address] [place], [place]  [address]  Dear Ms. Reitman,  We have been unable to contact you by telephone.  Please call our office, at your earliest convenience, so that we may speak with you.   Sincerely,   Dutch Quint RN

## 2010-11-15 NOTE — Progress Notes (Signed)
Summary: ov for ref rectal bldg, headaches,depression today or tomorrow  Phone Note Call from Patient Call back at [phone]??   Summary of Call: To Er Thurs for rectal bleeding.  They Said to see Dr. Clent Ridges or get referral to GI.  Want to go to [facility].  " Dr. Clent Ridges knows the doctor here has been jerking me around."   Mom in [place] is emailing her the name of the doctor she sqw for same problem, it's something that runs in families, same problem as her.  Also need med for depression.  Also headaches.  Request ov Dr. Clent Ridges today or tomorrow.   Initial call taken by: Rudy Jew, RN,  April 23, 2010 9:55 AM  Follow-up for Phone Call        Pt adv that Dr Marzetta Board (GI) office if giving her the "run around".... Pt was seen at the ED on Thursday night (04/19/2010) - adv they didn't dx her with anything, just told her to go see her PCP and sent her home.... Pt adv she is having sxs of rectal bleeding, pressure and pain from same, headaches... Pt adv she is having near syncopal episodes along with sxs.... Pt says that all this is making her depression worse... Pt stated she is not going to the ED because she has already been evalutated there and they didn't do anything for her..... Pt was offered to be transferred to Triage but she adv that she has already spoke to someone in Triage and she wants to talk to Dr Clent Ridges.    Follow-up by: Debbra Riding,  April 23, 2010 12:00 PM  Additional Follow-up for Phone Call Additional follow up Details #1::        please set her up to see me later this week Additional Follow-up by: Nelwyn Salisbury MD,  April 23, 2010 2:48 PM    Additional Follow-up for Phone Call Additional follow up Details #2::    Wed appointment set. Follow-up by: Rudy Jew, RN,  April 23, 2010 3:06 PM

## 2010-11-15 NOTE — Progress Notes (Signed)
Summary: PASSED OUT 2 DAYS THIS WEEK  Phone Note Call from Patient Call back at Home Phone [phone] Call back at [phone]   Reason for Call: Talk to Nurse Summary of Call: MARTIN PT. 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 SHE WAS TOLD BY THEM TO SEE IF SHE CAN GET IN TO SEE HER DR. Initial call taken by: Leodis Rains,  August 22, 2010 12:31 PM  Follow-up for Phone Call        [facility] faxed request for records to be sent here -- awaiting records.  Dutch Quint RN  August 22, 2010 5:14 PM  Have not received records -- sent second request.  Dutch Quint RN  August 27, 2010 5:23 PM  Followed up with [facility] -- received request -- in progress. Left message on answering machine for pt. to return call.  Dutch Quint RN  August 28, 2010 3:36 PM  Have not received records from [facility].  Left message on voicemail for pt. to return call.  Dutch Quint RN  August 30, 2010 10:36 AM   Additional Follow-up for Phone Call Additional follow up Details #1::        Left message on answer machine for pt. to return call. Gaylyn Cheers RN  September 04, 2010 3:18 PM  Letter sent.  Dutch Quint RN  September 04, 2010 5:08 PM

## 2010-11-15 NOTE — Progress Notes (Signed)
Summary: hospital d/c  Phone Note Call from Patient Call back at Work Phone [phone]   Caller: Patient Call For: Dr. Arlyce Dice Reason for Call: Talk to Nurse Summary of Call: would like to follow up with anurse since being d/c'ed from hospital Initial call taken by: Vallarie Mare,  July 25, 2010 10:42 AM  Follow-up for Phone Call        Left message for pt to call us back. Jesse Fall RN  July 25, 2010 10:54 AM Left message for patient to call back Darcey Nora RN, [facility]  July 25, 2010 4:04 PM   Patient  states she is no better than when she left the hospital she .  She will come in and see Willette Cluster RNP in the am  Follow-up by: Darcey Nora RN, CGRN,  July 25, 2010 5:00 PM

## 2010-11-15 NOTE — Progress Notes (Signed)
Summary: no better  Phone Note Call from Patient Call back at Home Phone [phone]   Caller: Patient Call For: Jameer Storie Summary of Call: pt needs zpack and cough med [facility] [address], pt not feeling better Initial call taken by: Alfred Levins, CMA,  November 27, 2009 11:09 AM  Follow-up for Phone Call        call in a Zpack and Hydromet one tsp q 4 hours as needed cough, 240 ml, no rf Follow-up by: Nelwyn Salisbury MD,  November 27, 2009 1:10 PM  Additional Follow-up for Phone Call Additional follow up Details #1::        Phone Call Completed, Rx Called In Additional Follow-up by: Alfred Levins, CMA,  November 27, 2009 1:42 PM

## 2010-11-15 NOTE — Letter (Signed)
Summary: AGREEMENT FOR CONTROLLED SUBSTANCES  AGREEMENT FOR CONTROLLED SUBSTANCES   Imported By: Arta Bruce 07/26/2010 15:52:40  _____________________________________________________________________  External Attachment:    Type:   Image     Comment:   External Document

## 2010-11-15 NOTE — Letter (Signed)
Summary: [facility]-post op follow up  [facility]-post op follow up   Imported By: Maryln Gottron 03/21/2010 14:44:25  _____________________________________________________________________  External Attachment:    Type:   Image     Comment:   External Document

## 2010-11-15 NOTE — Progress Notes (Signed)
Summary: CALL CONCERNING MED REFILL / INSURANCE SITUATION  Phone Note Call from Patient Message from:  Patient on June 11, 2010 12:25 PM  Refills Requested: Medication #1:  CYMBALTA 60 MG CPEP once daily   Notes: Pt adv that she needs to have Rx filled before the end of the month because her current insurance ([employer]) is being d/c so she can get on regular Medicaid.... Pt can be reached at  [phone].  Medication #2:  XANAX 1 MG  TABS three times a day   Notes: Pt adv that she needs to have Rx filled before the end of the month because her current insurance ([employer]) is being d/c so she can get on regular Medicaid.... Pt can be reached at  [phone].  Medication #3:  ADDERALL 30 MG TABS three times a day   Notes: Pt adv that she needs to have Rx filled before the end of the month because her current insurance ([employer]) is being d/c so she can get on regular Medicaid.... Pt can be reached at  [phone].  Medication #4:  OXYCODONE HCL 15 MG TABS one four times a day as needed pain   Notes: Pt adv that she needs to have Rx filled before the end of the month because her current insurance ([employer]) is being d/c so she can get on regular Medicaid.... Pt can be reached at  [phone].    Initial call taken by: Debbra Riding,  June 11, 2010 12:28 PM Caller: Patient  [phone] Summary of Call: Pt called to adv that she came in for her appt recently and was told that she would not be able to see Dr Clent Ridges any longer due to her insurance (Medicaid) ... Pt stated that she was advised that she would have to try to get another "type" of medicaid (pt adv she is in the process of talking with her social worker to see what can be done?  Switch from [place] [employer] to regular Medicaid?).... Pt adv that whomever she spoke with up front (can't remember name) told her that they were going to ck with Dr Clent Ridges to see if he could recommend another physician until she can  get her insurance straightened out.... Pt also adv that she needs her meds refilled.Marland KitchenMarland KitchenMarland KitchenCan you advise?   Initial call taken by: Debbra Riding,  June 11, 2010 12:25 PM  Follow-up for Phone Call        No I do not know who takes Medicaid in town. I would suggest she talk to the Health Dept. so they can help her with this. Follow-up by: Nelwyn Salisbury MD,  June 11, 2010 1:36 PM  Additional Follow-up for Phone Call Additional follow up Details #1::        Pt aware of instructions but adv that she will still need refills till her insurance is straightened out... Can you advise status of refills?  Additional Follow-up by: Debbra Riding,  June 11, 2010 1:57 PM    Additional Follow-up for Phone Call Additional follow up Details #2::    the same law that prohibits me from seeing her in the office prohibits me from treating her at all, and refilling meds is a form of treating her. Therefore I cannot refill her meds  Follow-up by: Nelwyn Salisbury MD,  June 11, 2010 4:48 PM  Additional Follow-up for Phone Call Additional follow up Details #3:: Details for Additional Follow-up Action Taken: called, [facility] Additional Follow-up by: Raechel Ache, RN,  June 11, 2010 4:51 PM

## 2010-11-15 NOTE — Letter (Signed)
Summary: FAXED REQUESTED RECORS TO [facility]  FAXED REQUESTED RECORS TO [facility]   Imported By: Arta Bruce 10/26/2010 09:20:34  _____________________________________________________________________  External Attachment:    Type:   Image     Comment:   External Document

## 2010-11-15 NOTE — Progress Notes (Signed)
Summary: wants early refills  Phone Note Call from Patient Call back at [phone]   Caller: Patient---live call Summary of Call: Boyfriend, justin Shana Chute will pick up her rx today per pt request.  Initial call taken by: Warnell Forester,  June 13, 2010 12:26 PM  Follow-up for Phone Call        pt called back and wanted Dr Clent Ridges to handwrite on rxs "to be filled today-06-13-2010." She said because pharmacy will not fill without that info. I called [facility], and they said even with that info, they will not be able to fill, unless Dr Clent Ridges calls himself to verbally authorize. Boyfriend did come to pickup rxs with his picture ID. I tried to call pt several times to let her know that the rxs might not be filled today because Dr Clent Ridges is out of office at this moment. My call went to voicemail only. Please advise pt. Follow-up by: Warnell Forester,  June 13, 2010 12:59 PM  Additional Follow-up for Phone Call Additional follow up Details #1::        please call the pharmacy to straighten this out Additional Follow-up by: Nelwyn Salisbury MD,  June 13, 2010 1:25 PM

## 2010-11-15 NOTE — Progress Notes (Signed)
Summary: NEED TO SPEAK WITH YOU  Phone Note Call from Patient   Summary of Call: PT NEED TALK TO Thermon Leyland CALL HER AT [phone] ABOUT A REFERAL Initial call taken by: Domenic Polite,  September 13, 2010 4:16 PM  Follow-up for Phone Call        I CALL HER BUT SAYS  THE SUSCRIBER IS NOT ACCEPTING INCOMING CALLS .Marland KitchenCheryll Dessert  September 14, 2010 5:34 PM   Additional Follow-up for Phone Call Additional follow up Details #1::        pt seen in office on 09/25/2010 Additional Follow-up by: Lehman Prom FNP,  September 26, 2010 8:55 AM

## 2010-11-15 NOTE — Progress Notes (Signed)
Summary: Pt req med list and doctors, in order to file for disability  Phone Note Call from Patient Call back at Home Phone [phone]   Caller: Patient Summary of Call: Pt is req med list and doctors notes from ov because pt is filing for disability.  Pt will sign release if necessary. Initial call taken by: Lucy Antigua,  January 12, 2010 8:56 AM  Follow-up for Phone Call        Pt has been advised that she needs to fill in a medical records release form in order to get req items.     Follow-up by: Lucy Antigua,  January 12, 2010 11:36 AM

## 2010-11-15 NOTE — Progress Notes (Signed)
Summary: Pain Clinic Referral  Phone Note Outgoing Call   Summary of Call: Refer pt to pain clinic Initial call taken by: Lehman Prom FNP,  August 09, 2010 9:34 AM  Follow-up for Phone Call        SEND REFERRAL TO [facility] [address]  [id number] PH #[phone] WAITING FOR AN APPT Follow-up by: [name],  August 09, 2010 10:00 AM

## 2010-11-15 NOTE — Assessment & Plan Note (Signed)
Summary: follow up from MRI and pain in back/cjr   Vital Signs:  Patient profile:   35 year old female Weight:      166 pounds Temp:     98.5 degrees F oral Pulse rate:   120 / minute BP sitting:   122 / 84  (left arm) Cuff size:   regular  Vitals Entered By: Alfred Levins, CMA (December 13, 2009 10:10 AM) CC: MRI results, referral to physical therapy   History of Present Illness: Here to follow up on low back pain. Her recent MRI showed surprisingly little pathology, with only a few spots of mild disc disease. She is using some Oxycodone with good pain relief. She would like to try PT dor the neck and low back.   Current Medications (verified): 1)  Xanax 1 Mg  Tabs (Alprazolam) .... Three Times A Day 2)  Promethazine Hcl 25 Mg  Tabs (Promethazine Hcl) .... Three Times A Day As Needed Nausea 3)  Adderall 30 Mg Tabs (Amphetamine-Dextroamphetamine) .... Three Times A Day 4)  Oxycodone Hcl 10 Mg Tabs (Oxycodone Hcl) .Marland Kitchen.. 1 Q 6 Hours As Needed Pain 5)  Maxalt 10 Mg Tabs (Rizatriptan Benzoate) .... As Needed For Migraines  Allergies (verified): 1)  ! Penicillin G Potassium (Penicillin G Potassium) 2)  ! Darvocet  Past History:  Past Medical History: Reviewed history from 10/26/2009 and no changes required. Anemia-NOS Depression ADD HeadacheS Anxiety IBS INTERNAL HEMORRHOIDS/SURGERY 2007  Past Surgical History: Reviewed history from 11/24/2009 and no changes required. [facility] colonoscopy 11-15-09 per Dr. Arlyce Dice, clear except for some hemorrhoids  Review of Systems  The patient denies anorexia, fever, weight loss, weight gain, vision loss, decreased hearing, hoarseness, chest pain, syncope, dyspnea on exertion, peripheral edema, prolonged cough, headaches, hemoptysis, abdominal pain, melena, hematochezia, severe indigestion/heartburn, hematuria, incontinence, genital sores, muscle weakness, suspicious skin lesions, transient blindness, difficulty walking, depression, unusual weight  change, abnormal bleeding, enlarged lymph nodes, angioedema, breast masses, and testicular masses.    Physical Exam  General:  Well-developed,well-nourished,in no acute distress; alert,appropriate and cooperative throughout examination Neck:  tende rwith some spasm and reduced ROM Msk:  lower back has some spasm and limited ROM   Impression & Recommendations:  Problem # 1:  LOW BACK PAIN SYNDROME (ICD-724.2) Assessment Deteriorated  The following medications were removed from the medication list:    Vicodin 5-500 Mg Tabs (Hydrocodone-acetaminophen) .Marland Kitchen... 1 every 6 hours as needed Her updated medication list for this problem includes:    Oxycodone Hcl 10 Mg Tabs (Oxycodone hcl) .Marland Kitchen... 1 q 6 hours as needed pain  Orders: Physical Therapy Referral (PT)  Problem # 2:  NECK SPRAIN AND STRAIN (ICD-847.0) Assessment: Deteriorated  The following medications were removed from the medication list:    Vicodin 5-500 Mg Tabs (Hydrocodone-acetaminophen) .Marland Kitchen... 1 every 6 hours as needed Her updated medication list for this problem includes:    Oxycodone Hcl 10 Mg Tabs (Oxycodone hcl) .Marland Kitchen... 1 q 6 hours as needed pain  Problem # 3:  ADD (ICD-314.00) Assessment: Unchanged  Complete Medication List: 1)  Xanax 1 Mg Tabs (Alprazolam) .... Three times a day 2)  Promethazine Hcl 25 Mg Tabs (Promethazine hcl) .... Three times a day as needed nausea 3)  Adderall 30 Mg Tabs (Amphetamine-dextroamphetamine) .... Three times a day, may fill on 02-12-10 4)  Oxycodone Hcl 10 Mg Tabs (Oxycodone hcl) .Marland Kitchen.. 1 q 6 hours as needed pain 5)  Maxalt 10 Mg Tabs (Rizatriptan benzoate) .... As needed for migraines  Patient Instructions: 1)  we will set up PT soon   Prescriptions: OXYCODONE HCL 10 MG TABS (OXYCODONE HCL) 1 q 6 hours as needed pain  #100 x 0   Entered and Authorized by:   Nelwyn Salisbury MD   Signed by:   Nelwyn Salisbury MD on 12/13/2009   Method used:   Print then Give to Patient   RxID:    [id number] ADDERALL 30 MG TABS (AMPHETAMINE-DEXTROAMPHETAMINE) three times a day, may fill on 02-12-10  #90 x 0   Entered and Authorized by:   Nelwyn Salisbury MD   Signed by:   Nelwyn Salisbury MD on 12/13/2009   Method used:   Print then Give to Patient   RxID:   [id number] ADDERALL 30 MG TABS (AMPHETAMINE-DEXTROAMPHETAMINE) three times a day, may fill on 01-13-10  #90 x 0   Entered and Authorized by:   Nelwyn Salisbury MD   Signed by:   Nelwyn Salisbury MD on 12/13/2009   Method used:   Print then Give to Patient   RxID:   [id number] ADDERALL 30 MG TABS (AMPHETAMINE-DEXTROAMPHETAMINE) three times a day  #90 x 0   Entered and Authorized by:   Nelwyn Salisbury MD   Signed by:   Nelwyn Salisbury MD on 12/13/2009   Method used:   Print then Give to Patient   RxID:   [id number]

## 2010-11-15 NOTE — Procedures (Signed)
Summary: Colonoscopy   Colonoscopy  Procedure date:  05/24/2003  Findings:      Results: Hemorrhoids.     Location:  [facility].    NAME:  [name], [name]                           ACCOUNT NO.:  [id number]   MEDICAL RECORD NO.:  [id number]                   PATIENT TYPE:  AMB   LOCATION:  ENDO                                 FACILITY:  [facility]   PHYSICIAN:  John C. Madilyn Fireman, M.D.                 DATE OF BIRTH:  [birth year]   DATE OF PROCEDURE:  05/24/2003  DATE OF DISCHARGE:                                 OPERATIVE REPORT   PROCEDURE:  Colonoscopy.   INDICATION FOR PROCEDURE:  Rectal bleeding, tenesmus, and alteration in  bowel habits.  The procedure is primarily to rule out inflammatory bowel  disease as well as careful inspection of the perianal area for fissures or  hemorrhoids.   DESCRIPTION OF PROCEDURE:  The patient was placed in the left lateral  decubitus position and placed on the pulse monitor with continuous low-flow  oxygen delivered by nasal cannula.  She was sedated with 100 mcg IV fentanyl  and 10 mg IV Versed.  The Olympus video colonoscope was inserted into the  rectum and advanced to the cecum, confirmed by transillumination at  McBurney's point and visualization of the ileocecal valve and appendiceal  orifice.  The prep was excellent.  The terminal ileum was intubated and  explored for several centimeters and appeared to be within normal limits.  The cecum, ascending, transverse, descending, and sigmoid colon all appeared  normal with no masses, polyps, diverticula, or other mucosal abnormalities.  The rectum was generally normal, but there were a few small punctate red  areas in the distal 5 cm with some mild granularity, possibly representing  very mild proctitis.  Retroflexed view of the anus did reveal some small but  somewhat irritated-appearing internal hemorrhoids.  During visual inspection  after the procedure she appeared to have a small anal  fissure that was not  very deep and located at about the 12 o'clock position.  The scope was then  withdrawn and the patient returned to the recovery room in stable condition.  She tolerated the procedure well, and there were no immediate complications.   IMPRESSION:  1. Questionable minimal distal proctitis.  2. Small possibly irritated internal hemorrhoids.  3. Possible small anal fissure.   PLAN:  Await biopsy results, and will probably treat with an agent such as  ProctoFoam and begin a fiber supplement.                                               John C. Madilyn Fireman, M.D.    J[facility]/MEDQ  D:  05/24/2003  T:  05/24/2003  Job:  [id number]  cc:   Marcene Duos, M.D.  [address] [place]  [place] [address]  Fax: [phone]   This report was created from the original endoscopy report, which was reviewed and signed by the above listed endoscopist.

## 2010-11-15 NOTE — Progress Notes (Signed)
Summary: Triage  Phone Note Call from Patient Call back at Work Phone [phone]   Caller: Patient Call For: Dr. Arlyce Dice Reason for Call: Talk to Nurse Summary of Call: pt. was seen in [facility] ER last night. Abd pain, bloating, passing out episode...requseting sooner appt. than appt on 08-20-10 Initial call taken by: Karna Christmas,  July 13, 2010 3:16 PM  Follow-up for Phone Call        Please see ER MD note from last night. Pt. continues w/abd. pain, n/v.   1) See Dr.Kaplan on 07-16-10 at 9:30am. 2) Soft,bland diet. No spicy,greasy,fried foods. Plenty of liquids. 3) tylenol/Ibuprofen as needed 4) Phenergan as needed 5) Continue Omeprazole 40mg   6) If symptoms become worse call back immediately or go to ER.  Follow-up by: Laureen Ochs LPN,  July 13, 2010 3:52 PM  Additional Follow-up for Phone Call Additional follow up Details #1::        Would avoid Ibuprofen due to ulcers on EGD. May add Carafate 1 gm , #30 , 1 by mouth two times a day, no refill Additional Follow-up by: Hart Carwin MD,  July 13, 2010 5:46 PM

## 2010-11-15 NOTE — Procedures (Signed)
Summary: Instructions for procedure/[facility] (out pt)  Instructions for procedure/[facility] (out pt)   Imported By: Sherian Rein 10/30/2009 09:12:42  _____________________________________________________________________  External Attachment:    Type:   Image     Comment:   External Document

## 2010-11-15 NOTE — Progress Notes (Signed)
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  These medications were discontinued during that hospital stay so WHY did she request them the very next day from this office. NO more refills on xanax, adderall, oxycodone (all of which were d/c'd during at hospital) Pt was detoxed from these meds.  This provider will not refill. It was unfortunate I did not catch this before she made the request on 10/23/2010 or I would not have filled then. No more.  Pt can transfer care if she wishes to whomever she may that will fill these meds if she does not want to continue care.  I see that she did go to GI which is great and she can still be seen here for other general health issues but she will not get refills on the aforementioned meds.   Initial call taken by: Lehman Prom FNP,  October 29, 2010 6:47 PM  Follow-up for Phone Call        Pt. notified - verbalized "OK".  Dutch Quint RN  October 30, 2010 2:39 PM

## 2010-11-15 NOTE — Progress Notes (Signed)
Summary: Triage  Phone Note Call from Patient Call back at Work Phone [phone]   Caller: Patient Call For: Mike Gip Summary of Call: pt. went to ER over the weekend and had CT scan. Still sick since she came home. Initial call taken by: Karna Christmas,  June 19, 2010 3:13 PM  Follow-up for Phone Call        I called Caress's work #, [phone], and LM for her today, apologized for not calling her before 5PM yesterday.  Called home number today at 8:20 am and got a recording that the party I was calling  is not accepting calls at this time.  Will try later. Follow-up by: Joselyn Glassman,  June 20, 2010 8:27 AM  Additional Follow-up for Phone Call Additional follow up Details #1::        SPoke to [facility] PA and she reviewed the CT Ivee had at the hospital on Sunday, 06-17-10.   Called Suni back and told her Amy needs to talk to Dr Arlyce Dice about the CT.  It looks somewhat normal with the exception of some inflammation outside the Aorta that would not explain her symptoms.  Sarahelizabeth said she also tried to have a BM and had nothing but green mucus with some blood.  I asked her if she strained but she didnt seem to think so.  Additional Follow-up by: Joselyn Glassman,  June 20, 2010 1:50 PM    Additional Follow-up for Phone Call Additional follow up Details #2::    Needs office ap't with me Follow-up by: Louis Meckel MD,  June 20, 2010 3:57 PM   Appended Document: Triage Pt. for labs and REV on 06-21-10.

## 2010-11-15 NOTE — Progress Notes (Signed)
Summary: Confirm appt. w/pt.  Phone Note Outgoing Call   Call placed by: Laureen Ochs LPN,  November 13, 2009 3:18 PM Call placed to: Patient Summary of Call: I am calling to confirm Colon/Propofol appt. on 11-15-09. Message left for patient to callback.  Initial call taken by: Laureen Ochs LPN,  November 13, 2009 3:19 PM  Follow-up for Phone Call        No return call rec'd from pt. Message left to call me to confirm procedure appt. for tomorrow.   Teryl Lucy RN  November 14, 2009 10:35 AM   I am still unable to reach pt. to confirm appt. for Colonoscopy today. She cancelled the last procedure the day of. I have left a message asking her to call me ASAP to confirm she will be having her Colonoscopy today. Follow-up by: Laureen Ochs LPN,  November 15, 2009 8:48 AM  Additional Follow-up for Phone Call Additional follow up Details #1::        Per Dr.Kaplan--He spoke w/pt. last night, she stated she will keep appt. today. Additional Follow-up by: Laureen Ochs LPN,  November 15, 2009 9:31 AM

## 2010-11-15 NOTE — Assessment & Plan Note (Signed)
Summary: F/u Chronic issues   Vital Signs:  Patient profile:   35 year old female Menstrual status:  hysterectomy Weight:      158.6 pounds BMI:     24.56 Temp:     97.1 degrees F oral Pulse rate:   110 / minute Pulse rhythm:   regular Resp:     20 per minute BP sitting:   110 / 80  (left arm) Cuff size:   regular  Vitals Entered By: Levon Hedger (September 25, 2010 12:24 PM) CC: follow-up visit for abdominal bloating...medication refills, Depression Is Patient Diabetic? No Pain Assessment Patient in pain? no       Does patient need assistance? Functional Status Self care Ambulation Normal     Menstrual Status hysterectomy   Primary Care Provider:  [name], MD  CC:  follow-up visit for abdominal bloating...medication refills and Depression.  History of Present Illness:  Pt into the office for f/u on multiple chronic illnesses.  GI - history of IBS and bloating. Still with some bloating at this time Needs referral to GI  Depression - pt has been to [name] - Child psychotherapist as ordered.  She is going to be outsourced to Theatre manager and provider  Pain - pt was referred to pain clinic in [place].  She has recently relocated and that office will be almost 1.5 hrs from her new home.  She is requesting referral to another pain clinic near [facility] or [facility].  Depression History:      The patient comes in today for her second follow up visit for depression.  The patient is having a depressed mood most of the day and has a diminished interest in her usual daily activities.  Positive alarm features for depression include fatigue (loss of energy).  However, she denies recurrent thoughts of death or suicide.        Psychosocial stress factors include major life changes.  The patient denies that she feels like life is not worth living, denies that she wishes that she were dead, and denies that she has thought about ending her life.          Habits & Providers  Alcohol-Tobacco-Diet     Alcohol drinks/day: 0     Tobacco Status: current     Tobacco Counseling: to quit use of tobacco products     Cigarette Packs/Day: 1.0     Year Started: age 4  Exercise-Depression-Behavior     Does Patient Exercise: no     Have you felt down or hopeless? yes     Have you felt little pleasure in things? yes     Depression Counseling: further diagnostic testing and/or other treatment is indicated     Drug Use: past  Allergies (verified): 1)  ! Penicillin G Potassium (Penicillin G Potassium) 2)  ! Darvocet 3)  Morphine  Social History: Drug Use:  past  Review of Systems CV:  Denies chest pain or discomfort. Resp:  Denies cough. GI:  Complains of abdominal pain; denies nausea and vomiting. MS:  Complains of low back pain. Psych:  Complains of anxiety and depression.  Physical Exam  General:  alert.   Head:  normocephalic.   Lungs:  normal breath sounds.   Heart:  normal rate and regular rhythm.   Abdomen:  normal bowel sounds.  mild distention Neurologic:  alert & oriented X3.   Psych:  Oriented X3.     Impression & Recommendations:  Problem #  1:  LOW BACK PAIN SYNDROME (ICD-724.2) will refer to the pain clinic Her updated medication list for this problem includes:    Oxycodone Hcl 15 Mg Tabs (Oxycodone hcl) ..... One tablet by mouth four times a day for pain  Orders: T-Drug Screen-Urine, ea (mullti) [phone])  Problem # 2:  ANXIETY (ICD-300.00) pt has started counseling sessions as ordered and she will f/u Her updated medication list for this problem includes:    Xanax 1 Mg Tabs (Alprazolam) ..... One tablet by mouth three times a day    Cymbalta 60 Mg Cpep (Duloxetine hcl) ..... One capsule by mouth daily    Wellbutrin Xl 150 Mg Xr24h-tab (Bupropion hcl) ..... One tablet by mouth daily  Orders: T-Drug Screen-Urine, ea (mullti) [phone])  Problem # 3:  IRRITABLE BOWEL SYNDROME (ICD-564.1) will refer  to GI Orders: Gastroenterology Referral (GI)  Complete Medication List: 1)  Xanax 1 Mg Tabs (Alprazolam) .... One tablet by mouth three times a day 2)  Adderall Xr 30 Mg Xr24h-cap (Amphetamine-dextroamphetamine) .... 3 capsules by mouth daily  **brand name medically necessary** 3)  Cymbalta 60 Mg Cpep (Duloxetine hcl) .... One capsule by mouth daily 4)  Oxycodone Hcl 15 Mg Tabs (Oxycodone hcl) .... One tablet by mouth four times a day for pain 5)  Maxalt-mlt 10 Mg Tbdp (Rizatriptan benzoate) .... As needed for ha 6)  Omeprazole 40 Mg Cpdr (Omeprazole) .Marland Kitchen.. 1 each day 30 minutes before meal 7)  Promethazine Hcl 25 Mg Tabs (Promethazine hcl) .... Take one tablet every 6 hours as needed for nausea 8)  Seroquel 50 Mg Tabs (Quetiapine fumarate) .... One tablet by mouth nightly 9)  Dicyclomine Hcl 20 Mg Tabs (Dicyclomine hcl) .... Three times a day as needed for stomach cramps 10)  Wellbutrin Xl 150 Mg Xr24h-tab (Bupropion hcl) .... One tablet by mouth daily 11)  Furosemide 20 Mg Tabs (Furosemide) .... One tablet by mouth daily as needed for fluid  Other Orders: Pain Clinic Referral (Pain)  Patient Instructions: 1)  Mental Health - Be in touch with Marchelle Folks for transition for mental health.  You need both medication services and counseling services. 2)  Pain - You will be referred to a pain clinic perhaps in [facility] or [place].  You will be notified of the time date of the appointment.  3)  This is the third month for pain med refills. This will be the last refill for pain medications.  As indicated when you established this office will not write for chronic pain meds. 4)  Be sure this office has a working number for you so that we can contact you. 5)  GI - You will be referred to another GI provider. 6)  Follow up in 2 months for chronic illnesses. Prescriptions: ADDERALL XR 30 MG XR24H-CAP (AMPHETAMINE-DEXTROAMPHETAMINE) 3 capsules by mouth daily  **Brand name medically necessary** Brand  medically necessary #90 x 0   Entered and Authorized by:   Lehman Prom FNP   Signed by:   Lehman Prom FNP on 09/25/2010   Method used:   Print then Give to Patient   RxID:   [id number] ADDERALL XR 30 MG XR24H-CAP (AMPHETAMINE-DEXTROAMPHETAMINE) 3 capsules by mouth daily  **Brand name medically necessary** Brand medically necessary #90 x 0   Entered and Authorized by:   Lehman Prom FNP   Signed by:   Lehman Prom FNP on 09/25/2010   Method used:   Print then Give to Patient   RxID:   [id number] PROMETHAZINE  HCL 25 MG  TABS (PROMETHAZINE HCL) Take one tablet every 6 hours as needed for nausea  #60 x 0   Entered and Authorized by:   Lehman Prom FNP   Signed by:   Lehman Prom FNP on 09/25/2010   Method used:   Print then Give to Patient   RxID:   [id number] SEROQUEL 50 MG TABS (QUETIAPINE FUMARATE) One tablet by mouth nightly  #30 x 0   Entered and Authorized by:   Lehman Prom FNP   Signed by:   Lehman Prom FNP on 09/25/2010   Method used:   Print then Give to Patient   RxID:   [id number] FUROSEMIDE 20 MG TABS (FUROSEMIDE) One tablet by mouth daily as needed for fluid  #10 x 0   Entered and Authorized by:   Lehman Prom FNP   Signed by:   Lehman Prom FNP on 09/25/2010   Method used:   Print then Give to Patient   RxID:   [id number] PROMETHAZINE HCL 25 MG  TABS (PROMETHAZINE HCL) Take one tablet every 6 hours as needed for nausea  #60 x 0   Entered and Authorized by:   Lehman Prom FNP   Signed by:   Lehman Prom FNP on 09/25/2010   Method used:   Print then Give to Patient   RxID:   [id number] SEROQUEL 50 MG TABS (QUETIAPINE FUMARATE) One tablet by mouth nightly  #30 x 0   Entered and Authorized by:   Lehman Prom FNP   Signed by:   Lehman Prom FNP on 09/25/2010   Method used:   Print then Give to Patient   RxID:   [id number] WELLBUTRIN XL 150 MG XR24H-TAB (BUPROPION HCL) One tablet by mouth  daily  #30 x 1   Entered and Authorized by:   Lehman Prom FNP   Signed by:   Lehman Prom FNP on 09/25/2010   Method used:   Print then Give to Patient   RxID:   [id number] OXYCODONE HCL 15 MG TABS (OXYCODONE HCL) One tablet by mouth four times a day for pain  #120 x 0   Entered and Authorized by:   Lehman Prom FNP   Signed by:   Lehman Prom FNP on 09/25/2010   Method used:   Print then Give to Patient   RxID:   [id number] CYMBALTA 60 MG CPEP (DULOXETINE HCL) One capsule by mouth daily  #30 x 0   Entered and Authorized by:   Lehman Prom FNP   Signed by:   Lehman Prom FNP on 09/25/2010   Method used:   Print then Give to Patient   RxID:   [id number] ADDERALL 30 MG TABS (AMPHETAMINE-DEXTROAMPHETAMINE) One tablet by mouth three times a day  #90 x 0   Entered and Authorized by:   Lehman Prom FNP   Signed by:   Lehman Prom FNP on 09/25/2010   Method used:   Print then Give to Patient   RxID:   [id number] EVOJJ 1 MG  TABS (ALPRAZOLAM) One tablet by mouth three times a day  #90 x 0   Entered and Authorized by:   Lehman Prom FNP   Signed by:   Lehman Prom FNP on 09/25/2010   Method used:   Print then Give to Patient   RxID:   [id number]    Orders Added: 1)  Est. Patient Level III [78938] 2)  T-Drug Oralia Manis, ea (mullti) [80101-77000] 3)  Pain Clinic Referral [Pain] 4)  Gastroenterology Referral [GI]  Laboratory Results   Urine Tests  Date/Time Received: September 25, 2010 1:06 PM   Routine Urinalysis   Color: lt. yellow Glucose: negative   (Normal Range: Negative) Bilirubin: negative   (Normal Range: Negative) Ketone: negative   (Normal Range: Negative) Spec. Gravity: <1.005   (Normal Range: 1.003-1.035) Blood: negative   (Normal Range: Negative) pH: 7.5   (Normal Range: 5.0-8.0) Protein: negative   (Normal Range: Negative) Urobilinogen: 0.2   (Normal Range: 0-1) Nitrite: negative   (Normal Range:  Negative) Leukocyte Esterace: negative   (Normal Range: Negative)

## 2010-11-15 NOTE — Op Note (Signed)
Summary: Hysterectomy-Dr. Lavina Hamman  Hysterectomy-Dr. Lavina Hamman   Imported By: Maryln Gottron 03/21/2010 14:51:53  _____________________________________________________________________  External Attachment:    Type:   Image     Comment:   External Document

## 2010-11-15 NOTE — Progress Notes (Signed)
  Phone Note Other Incoming   Request: Send information Summary of Call: Request received from [facility] forwarded to [employer].

## 2010-11-15 NOTE — Assessment & Plan Note (Signed)
Summary: PAIN IN LOWER BACK AND LEFT SIDE/CJR   Vital Signs:  Patient profile:   35 year old female Weight:      161 pounds BMI:     24.93 Temp:     97.8 degrees F oral BP sitting:   110 / 84  (left arm) Cuff size:   regular  Vitals Entered By: Raechel Ache, RN (January 26, 2010 9:41 AM) CC: C/o LBP and L leg going numb.   History of Present Illness: Here for advice about her low back pain and for med refills. She still has a lot of low back pain that can radiate down the left leg. We had set her up for PT, but she could not go at first due to lack of insurance coverage. She recently got her Medicaid back, and so now she is set to start PT next week. She is using oxycodone, but this does not last a full 6 hours. She is scheduled for a hysterectomy per Dr. Jarold Song on 02-27-10, and she is to see Dr. Jeani Hawking soon for a second GI opinion on her chronic pelvic pain. At the time of her surgery, they plan to do a rectum tack and a bladder tack as well. She has started back on Cymbalta for depression and would like some samples. She is thinking about applying for disability soon based on all these issues, but i advised her that we should get some more information about her problems first.   Allergies: 1)  ! Penicillin G Potassium (Penicillin G Potassium) 2)  ! Darvocet  Past History:  Past Medical History: Reviewed history from 01/03/2010 and no changes required. Anemia-NOS Depression ADD Headaches Anxiety IBS INTERNAL HEMORRHOIDS/SURGERY 2007  Past Surgical History: Reviewed history from 11/24/2009 and no changes required. PPH colonoscopy 11-15-09 per Dr. Arlyce Dice, clear except for some hemorrhoids  Review of Systems  The patient denies anorexia, fever, weight loss, weight gain, vision loss, decreased hearing, hoarseness, chest pain, syncope, dyspnea on exertion, peripheral edema, prolonged cough, headaches, hemoptysis, melena, hematochezia, severe indigestion/heartburn,  hematuria, incontinence, genital sores, muscle weakness, suspicious skin lesions, transient blindness, difficulty walking, unusual weight change, abnormal bleeding, enlarged lymph nodes, angioedema, breast masses, and testicular masses.    Physical Exam  General:  Well-developed,well-nourished,in no acute distress; alert,appropriate and cooperative throughout examination Abdomen:  soft, normal bowel sounds, no distention, no masses, no guarding, no rigidity, no rebound tenderness, no abdominal hernia, no inguinal hernia, no hepatomegaly, and no splenomegaly.  Mildly tender diffusely.  Msk:  tender in the lower back with decreased ROM.  Psych:  Oriented X3, memory intact for recent and remote, normally interactive, good eye contact, and flat affect.     Impression & Recommendations:  Problem # 1:  LOW BACK PAIN SYNDROME (ICD-724.2)  Her updated medication list for this problem includes:    Oxycodone Hcl 10 Mg Tabs (Oxycodone hcl) .Marland Kitchen... 1 q 4  hours as needed pain  Problem # 2:  ABDOMINAL PAIN-LLQ (ICD-789.04)  Problem # 3:  DEPRESSION (ICD-311)  Her updated medication list for this problem includes:    Xanax 1 Mg Tabs (Alprazolam) .Marland Kitchen... Three times a day    Cymbalta 30 Mg Cpep (Duloxetine hcl) .Marland Kitchen... 1 once daily  Complete Medication List: 1)  Xanax 1 Mg Tabs (Alprazolam) .... Three times a day 2)  Promethazine Hcl 25 Mg Tabs (Promethazine hcl) .... Three times a day as needed nausea 3)  Adderall 30 Mg Tabs (Amphetamine-dextroamphetamine) .... Three times a day, may  fill on 02-12-10 4)  Oxycodone Hcl 10 Mg Tabs (Oxycodone hcl) .Marland Kitchen.. 1 q 4  hours as needed pain 5)  Maxalt 10 Mg Tabs (Rizatriptan benzoate) .... As needed for migraines 6)  Cymbalta 30 Mg Cpep (Duloxetine hcl) .Marland Kitchen.. 1 once daily  Patient Instructions: 1)  Given samples for Cymbalta and Maxalt. see what Dr. Elnoria Howard has to say. Proceed with her surgery. get into PT ASAP  Prescriptions: OXYCODONE HCL 10 MG TABS (OXYCODONE HCL) 1  q 4  hours as needed pain  #180 x 0   Entered and Authorized by:   Nelwyn Salisbury MD   Signed by:   Nelwyn Salisbury MD on 01/26/2010   Method used:   Print then Give to Patient   RxID:   [id number]

## 2010-11-15 NOTE — Assessment & Plan Note (Signed)
Summary: POST ER/PAIN, N/V                 [name]   History of Present Illness Visit Type: Follow-up Visit Primary GI MD: Melvia Heaps MD [facility] Primary [name]: [name], MD Chief Complaint: Nausea & vomiting blood, also blood in stool, abdominal pain History of Present Illness:   35 YO FEMALE KNOWN TO DR. KAPLAN WHO HAS BEEN UNDERGOING EVALUATION FOR C/O PERSISTENT ABDOMINA PAIN,PERIODIC ABDOMINAL DISTENTION,NAUSE,AND VOMITING. SHE C/O CHRONIC MID ABDOMINAL PAIN WITH ACUTE EPISODES OF MORE INTENSE PAIN. SHE HAS HAD SEVERAL ER VISITS. NO FEVERS,WEIGHT FAIRLY STABLE THOUGH SHE SAYS SHE ONLY EATS CEREAL MOST DAYS BECAUSE ANYTHING HEAVIER MAKES HER HURT WORSE. SHE IS CURRENTLY HAVING DIARRHEA WITH 5-6 BM'S DAY-THIS STARTED AFTER SHE WAS PLACED ON OMEPRAZOLE. SHE HAS ONGOING BLOATING,GAS AND PRESSURE IN HER LOWER ABDOMEN. LABS HAVE SHOWN AN ANEMIA ,AND SHE HAS AN ELEVATED CRP LEVEL. 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 AUTIMMUNE MARKERS ARE NEGATIVE. SHE WENT TO THE ER AGAIN LAST WEEK BECAUSE SHE VOMITED STREAKS OF BLOOD. LABS STABLE-NO XRAYS DONE.   GI Review of Systems    Reports abdominal pain, bloating, nausea, vomiting, and  vomiting blood.     Location of  Abdominal pain: left side.    Denies acid reflux, belching, chest pain, dysphagia with liquids, dysphagia with solids, heartburn, loss of appetite, weight loss, and  weight gain.      Reports black tarry stools, diarrhea, and  rectal bleeding.     Denies anal fissure, change in bowel habit, constipation, diverticulosis, fecal incontinence, heme positive stool, hemorrhoids, irritable bowel syndrome, jaundice, light color stool, liver problems, and  rectal pain.    Current Medications (verified): 1)  Xanax 1 Mg  Tabs (Alprazolam) .... Three Times A Day 2)  Adderall 30 Mg  Tabs (Amphetamine-Dextroamphetamine) .... Three Times A Day 3)  Cymbalta 60 Mg Cpep (Duloxetine Hcl) .... Once Daily 4)  Oxycodone Hcl 15 Mg Tabs (Oxycodone Hcl) .... One Four Times A Day As Needed Pain 5)  Maxalt-Mlt 10 Mg Tbdp (Rizatriptan Benzoate) .... As Needed For Ha 6)  Omeprazole 40 Mg  Cpdr (Omeprazole) .Marland Kitchen.. 1 Each Day 30 Minutes Before Meal 7)  Promethazine Hcl 25 Mg  Tabs (Promethazine Hcl) .... Take One Tablet Every 6 Hours As Needed.  Allergies (verified): 1)  ! Penicillin G Potassium (Penicillin G Potassium) 2)  ! Darvocet 3)  Morphine  Past History:  Past Medical History: Anemia- Depression ADD Headaches Anxiety  chronic abdominal pain, sees Dr. Melvia Heaps and Sondra Come PA chronic low back pain sees Dr. Jarold Song for GYN exams  Past Surgical History: Reviewed history from 04/25/2010 and no changes required. PPH to tack up internal hemorrhoids 2004 per Dr. Luisa Hart colonoscopy 11-15-09 per Dr. Arlyce Dice, clear except for some hemorrhoids laparoscopic assisted vaginal hysterectomy with A and P repair 02-27-10 per Dr. Jarold Song  Family History: Reviewed history from 10/26/2009 and no changes required. Family History of Colon Polyps:Mother Family History of Heart Disease: Mother Family History of Irritable Bowel Syndrome:Mother  Social History: Reviewed history from 06/15/2010 and no changes required. Divorced unemployed,61 MONTH OLD CHILD AND AN 78 YEAR OLD,15 YO Patient currently smokes.  Alcohol Use - no Daily Caffeine Use -3 Illicit Drug Use - no  Review of Systems       The patient complains of back pain, fainting, fatigue,  and headaches-new.  The patient denies allergy/sinus, anemia, anxiety-new, arthritis/joint pain, blood in urine, breast changes/lumps, change in vision, confusion, cough, coughing up blood, depression-new, fever, hearing problems, heart murmur, heart rhythm changes, itching, menstrual pain, muscle pains/cramps, night sweats,  nosebleeds, pregnancy symptoms, shortness of breath, skin rash, sleeping problems, sore throat, swelling of feet/legs, swollen lymph glands, thirst - excessive , urination - excessive , urination changes/pain, urine leakage, vision changes, and voice change.         SEE HPI  Vital Signs:  Patient profile:   35 year old female Height:      67.5 inches Weight:      157.38 pounds BMI:     24.37 Pulse rate:   116 / minute Pulse rhythm:   regular BP sitting:   110 / 72  (left arm) Cuff size:   regular  Vitals Entered By: June McMurray CMA Duncan Dull) (July 16, 2010 10:50 AM)  Physical Exam  General:  Well developed, well nourished, no acute distress. Head:  Normocephalic and atraumatic. Eyes:  PERRLA, no icterus. Lungs:  Clear throughout to auscultation. Heart:  Regular rate and rhythm; no murmurs, rubs,  or bruits. Abdomen:  DISTENDED,MILD TYMPANY,BS+,DIFFUSE TENDERNESS,NOT FOCAL, NO PALP MASS OR HSM Rectal:  NOT DONE Extremities:  No clubbing, cyanosis, edema or deformities noted. Neurologic:  Alert and  oriented x4;  grossly normal neurologically. Psych:  Alert and cooperative. Normal mood and affect.   Impression & Recommendations:  Problem # 1:  ABDOMINAL DISTENSION (ICD-787.3) Assessment Deteriorated 35 YO FEMALE WITH SEVERAL MONTH HX OF ABDOMINAL DISTENTION WHICH IS EPISODIC AND ASSOCIATED WITH GENERALIZED ABDOMINAL PAIN,NAUSEA AND VOMITING. WORKUP THUS FAR UNREVEALING AS TO CAUSE. SHE DOES HAVE AN ANEMIA?FE DIFICIENT,AND ELEVATED CRP, AND ABNORMAL CT AS DESRIBED WITH  ATHEROSCLEROTIC CHANGES, AND  SOME PERIAORTIC SOFT TISSUE DENSITY.   ? MESENTERIC INSUFFICIENCY,ARTERITIS ?    SCHEDULE FOR CT ANGIO OF MESENTERIC VESSELS SCHEDULE FOR SBFT THE FOLLOWING WEEK REFILL HYDROCODONE REFILL PHENERGAN STOP OMEPRAZOLE AS CAUSING DIARRHEA, AND GIVE TRIAL OF ACIPHEX 20 MG DAILY IN AM. CHECK REPEAT CRP, AND FE STUDIES Orders: Small Bowel Follow Through (Sm Bowel FT) TLB-IBC Pnl  (Iron/FE;Transferrin) (83550-IBC) TLB-CRP-High Sensitivity (C-Reactive Protein) (86140-FCRP)  Problem # 2:  ADD (ICD-314.00) Assessment: Comment Only  Problem # 3:  ANXIETY (ICD-300.00) Assessment: Comment Only  Problem # 4:  ANEMIA-NOS (ICD-285.9) Assessment: Comment Only SEE ABOVE Prescriptions: PROMETHAZINE HCL 25 MG  TABS (PROMETHAZINE HCL) Take one tablet every 6 hours as needed.  #30 x 0   Entered by:   Lowry Ram NCMA   Authorized by:   Sammuel Cooper PA-c   Signed by:   Lowry Ram NCMA on 07/16/2010   Method used:   Electronically to        [facility]  [address] [phone]* (retail)       [address]       [place], [place]  [address]       Ph: [phone]       Fax: [phone]   RxID:   [id number] OXYCODONE HCL 15 MG TABS (OXYCODONE HCL) one four times a day as needed pain  #20 x 0   Entered by:   Lowry Ram NCMA   Authorized by:   Sammuel Cooper PA-c   Signed by:   Lowry Ram NCMA on 07/16/2010   Method used:   Printed then faxed to ...       [facility]  [address] [id number] (retail)       [address]       [place], [place]  [address]       Ph: [phone]       Fax: [phone]   RxID:   [id number]

## 2010-11-15 NOTE — Letter (Signed)
Summary: Discharge Summary  Discharge Summary   Imported By: Arta Bruce 10/30/2010 10:22:44  _____________________________________________________________________  External Attachment:    Type:   Image     Comment:   External Document

## 2010-11-15 NOTE — Letter (Signed)
Summary: [facility]   Imported By: Arta Bruce 10/30/2010 11:15:38  _____________________________________________________________________  External Attachment:    Type:   Image     Comment:   External Document

## 2010-11-15 NOTE — Progress Notes (Signed)
Summary: Pt req refill Oxycodone. Pt says Dr Clent Ridges changed dosage  Phone Note Call from Patient Call back at [phone] cell   Caller: Patient Summary of Call: Pt called and said that she will be out of Oxycodon on Saturday 03/24/10. Pt said that Dr. Clent Ridges said that he was changing dosage from 10mg  6 times a day to 15mg  4 times a day. Pt said that this should be noted in her chart. Please call when ready for pick up.       Initial call taken by: Lucy Antigua,  March 23, 2010 9:01 AM  Follow-up for Phone Call        Dr Elman Dettman is out of the office and   will return on the 15th  can refill until he gets back to office . 6 days worth  .  DIsp 36#  oxycodone  rx Follow-up by: Madelin Headings MD,  March 23, 2010 1:26 PM  Additional Follow-up for Phone Call Additional follow up Details #1::        Pt aware and will come by to pick up rx. Additional Follow-up by: Romualdo Bolk, CMA Duncan Dull),  March 23, 2010 1:32 PM    Additional Follow-up for Phone Call Additional follow up Details #2::    done Follow-up by: Nelwyn Salisbury MD,  March 28, 2010 12:55 PM  New/Updated Medications: OXYCODONE HCL 15 MG TABS (OXYCODONE HCL) one four times a day as needed pain Prescriptions: OXYCODONE HCL 15 MG TABS (OXYCODONE HCL) one four times a day as needed pain  #120 x 0   Entered by:   Nelwyn Salisbury MD   Authorized by:   Madelin Headings MD   Signed by:   Nelwyn Salisbury MD on 03/28/2010   Method used:   Print then Give to Patient   RxID:   [id number] OXYCODONE HCL 10 MG TABS (OXYCODONE HCL) 1 q 4  hours as needed pain  #36 x 0   Entered by:   Romualdo Bolk, CMA (AAMA)   Authorized by:   Madelin Headings MD   Signed by:   Romualdo Bolk, CMA (AAMA) on 03/23/2010   Method used:   Print then Give to Patient   RxID:   [id number]

## 2010-11-15 NOTE — Progress Notes (Signed)
Summary: neds note by Tues  Phone Note Call from Patient Call back at Home Phone [phone]   Caller: Patient-live call Call For: Danielle Salisbury MD Reason for Call: Talk to Nurse Summary of Call: need status of disability  note to take to court tomorrow. Saw Dr Clent Ridges last week and he told her to call today for the status. please return her call. Initial call taken by: Warnell Forester,  February 05, 2010 1:50 PM  Follow-up for Phone Call        needs note to say out of work last 3 years due to migraines, ADD, depression and that she has filed for disability. Needs for Tuesday. Follow-up by: Raechel Ache, RN,  February 05, 2010 2:28 PM  Additional Follow-up for Phone Call Additional follow up Details #1::        done, in your box Additional Follow-up by: Danielle Salisbury MD,  February 06, 2010 8:11 AM    Additional Follow-up for Phone Call Additional follow up Details #2::    called for pick-up. Follow-up by: Raechel Ache, RN,  February 06, 2010 8:20 AM

## 2010-11-15 NOTE — Assessment & Plan Note (Signed)
Summary: NEW - Establish Care   Vital Signs:  Patient profile:   35 year old female Height:      67.5 inches Weight:      158 pounds BMI:     24.47 Temp:     98.1 degrees F oral Pulse rate:   72 / minute Pulse rhythm:   regular Resp:     18 per minute BP sitting:   120 / 82  (left arm) Cuff size:   regular  Vitals Entered By: Armenia Shannon (July 26, 2010 10:00 AM) CC: NP... , Depression, Back Pain Is Patient Diabetic? No Pain Assessment Patient in pain? no       Does patient need assistance? Functional Status Self care Ambulation Normal   Primary Care Provider:  [name], MD  CC:  NP... , Depression, and Back Pain.  History of Present Illness:  Pt into the office to establish care. Pt was previously seen at last PCP for 9 years - [facility]. (previous hx in reviewed in EMR) She was last seen there in 2 months ago and her insurance was lapsed.  [facility] - Pt has ongoing f/u with GI. Appt scheduled for today but she has to cancel due to establishing here.   Pt presents with several medications which she reports to having been on for extended periods of time. Reviewed with pt that this provider will NOT maintain her chronic pain meds as her past PCP did. She will need to be referred to the pain clinic - pt ok with this.  She did sign a pain contract today in office.    Reviewed with pt that per contract she is not to have any illegal drugs in her system. If found on subsequent checks she will not be given narcotics. (this prior to transfer to pain clinic)  Depression/ADD - also ongoing issue for pt for which she needs specialist.  Again reviewed that she will be referred to specialist to manage her medications ad for her chronic problem. Pt in agreement  Back Pain History:      The patient's back pain has been present for > 6 weeks.  She states this is not work related.  She states that she has had a prior history of back pain.  The patient has not had any  recent physical therapy for her back pain.    Critical Exclusionary Diagnosis Criteria (CEDC) for Back Pain:      The patient denies a history of previous trauma.  She has no prior history of spinal surgery.  There are no symptoms to suggest infection, cancer, cauda equina, or psychosocial factors for back pain.  Other positive CEDC factors include low back pain worse with activity.   Psychosocial stress factors include major life changes.  The patient denies that she feels like life is not worth living, denies that she wishes that she were dead, and denies that she has thought about ending her life.              Habits & Providers  Alcohol-Tobacco-Diet     Alcohol drinks/day: 0     Tobacco Status: current     Tobacco Counseling: to quit use of tobacco products     Cigarette Packs/Day: 1.0     Year Started: age 75  Exercise-Depression-Behavior     Does Patient Exercise: no     Have you felt down or hopeless? yes     Have you felt little pleasure in things? yes  Depression Counseling: further diagnostic testing and/or other treatment is indicated     Drug Use: never  Current Medications (verified): 1)  Xanax 1 Mg  Tabs (Alprazolam) .... Three Times A Day 2)  Adderall 30 Mg Tabs (Amphetamine-Dextroamphetamine) .... Three Times A Day 3)  Cymbalta 60 Mg Cpep (Duloxetine Hcl) .... Once Daily 4)  Oxycodone Hcl 15 Mg Tabs (Oxycodone Hcl) .... One Four Times A Day As Needed Pain 5)  Maxalt-Mlt 10 Mg Tbdp (Rizatriptan Benzoate) .... As Needed For Ha 6)  Omeprazole 40 Mg  Cpdr (Omeprazole) .Marland Kitchen.. 1 Each Day 30 Minutes Before Meal 7)  Promethazine Hcl 25 Mg  Tabs (Promethazine Hcl) .... Take One Tablet Every 6 Hours As Needed. 8)  Seroquel 50 Mg Tabs (Quetiapine Fumarate) .... At Bedtime 9)  Dicyclomine Hcl 20 Mg Tabs (Dicyclomine Hcl) .... Three Times A Day As Needed For Stomach Cramps  Allergies (verified): 1)  ! Penicillin G Potassium (Penicillin G Potassium) 2)  ! Darvocet 3)   Morphine  Social History: Divorced unemployed, 24  MONTH OLD CHILD AND AN 38 YEAR OLD,15 YO Patient currently smokes.  Alcohol Use - no Daily Caffeine Use -3 Illicit Drug Use - no Packs/Day:  1.0 Does Patient Exercise:  no Drug Use:  never  Review of Systems CV:  Denies chest pain or discomfort. Resp:  Denies cough. GI:  Complains of nausea; denies abdominal pain. Neuro:  Complains of headaches.  Physical Exam  General:  alert.   Head:  normocephalic.   Lungs:  normal breath sounds.   Heart:  normal rate and regular rhythm.   Abdomen:  normal bowel sounds.   Neurologic:  alert & oriented X3.   Skin:  color normal.     Impression & Recommendations:  Problem # 1:  LOW BACK PAIN SYNDROME (ICD-724.2) Will continue pain meds for now (as i can see her history in EMR from previous provider) will refer to pain clinic - will not prescribe chronic pain meds to this pt Her updated medication list for this problem includes:    Oxycodone Hcl 15 Mg Tabs (Oxycodone hcl) ..... One tablet by mouth four times a day for pain  Orders: T-Drug Screen-Urine, (single) [phone]) Pain Clinic Referral (Pain)  Problem # 2:  DEPRESSION (ICD-311) will refer to [name] Her updated medication list for this problem includes:    Xanax 1 Mg Tabs (Alprazolam) ..... One tablet by mouth three times a day    Cymbalta 60 Mg Cpep (Duloxetine hcl) ..... One capsule by mouth daily    Wellbutrin Xl 150 Mg Xr24h-tab (Bupropion hcl) ..... One tablet by mouth daily  Orders: Misc. Referral (Misc. Ref)  Problem # 3:  IRRITABLE BOWEL SYNDROME (ICD-564.1) continue f/u per GI  Problem # 4:  NEED PROPHYLACTIC VACCINATION&INOCULATION FLU (ICD-V04.81) given today in office  Complete Medication List: 1)  Xanax 1 Mg Tabs (Alprazolam) .... One tablet by mouth three times a day 2)  Adderall 30 Mg Tabs (Amphetamine-dextroamphetamine) .... One tablet by mouth three times a day 3)  Cymbalta 60 Mg Cpep  (Duloxetine hcl) .... One capsule by mouth daily 4)  Oxycodone Hcl 15 Mg Tabs (Oxycodone hcl) .... One tablet by mouth four times a day for pain 5)  Maxalt-mlt 10 Mg Tbdp (Rizatriptan benzoate) .... As needed for ha 6)  Omeprazole 40 Mg Cpdr (Omeprazole) .Marland Kitchen.. 1 each day 30 minutes before meal 7)  Promethazine Hcl 25 Mg Tabs (Promethazine hcl) .... Take one tablet every 6 hours as  needed for nausea 8)  Seroquel 50 Mg Tabs (Quetiapine fumarate) .... One tablet by mouth nightly 9)  Dicyclomine Hcl 20 Mg Tabs (Dicyclomine hcl) .... Three times a day as needed for stomach cramps 10)  Wellbutrin Xl 150 Mg Xr24h-tab (Bupropion hcl) .... One tablet by mouth daily  Other Orders: Flu Vaccine 32yrs + [phone]) Admin 1st Vaccine (60454) Admin 1st Vaccine [facility]) [phone])  Patient Instructions: 1)  Call GI to reschedule your appointment.  They were notified of your appointment here today and asked that you call them back. 2)  Schedule an appointment with [name] for ADD and depression referral. 3)  You will be referred to Pain management for chronic pain issues. 4)  You have signed a pain contract today.  Urine tox screen will be done today.  If negative you will be given 1 month worth of pain medications until your transition to the pain clinic. 5)  Come back tomorrow afternoon to pick up the prescription. 6)  You have been given the flu vaccine today. 7)  Follow up with n.martin,fnp in 6 weeks for chronic issues. 8)  will be sure that all referrals have been done then. Prescriptions: OXYCODONE HCL 15 MG TABS (OXYCODONE HCL) One tablet by mouth four times a day for pain  #120 x 0   Entered and Authorized by:   Lehman Prom FNP   Signed by:   Lehman Prom FNP on 07/27/2010   Method used:   Print then Give to Patient   RxID:   [id number] WELLBUTRIN XL 150 MG XR24H-TAB (BUPROPION HCL) One tablet by mouth daily  #30 x 1   Entered and Authorized by:   Lehman Prom FNP   Signed by:    Lehman Prom FNP on 07/26/2010   Method used:   Print then Give to Patient   RxID:   [id number] PROMETHAZINE HCL 25 MG  TABS (PROMETHAZINE HCL) Take one tablet every 6 hours as needed for nausea  #60 x 0   Entered and Authorized by:   Lehman Prom FNP   Signed by:   Lehman Prom FNP on 07/26/2010   Method used:   Print then Give to Patient   RxID:   [id number] SEROQUEL 50 MG TABS (QUETIAPINE FUMARATE) One tablet by mouth nightly  #30 x 0   Entered and Authorized by:   Lehman Prom FNP   Signed by:   Lehman Prom FNP on 07/26/2010   Method used:   Print then Give to Patient   RxID:   [id number] CYMBALTA 60 MG CPEP (DULOXETINE HCL) One capsule by mouth daily  #30 x 0   Entered and Authorized by:   Lehman Prom FNP   Signed by:   Lehman Prom FNP on 07/26/2010   Method used:   Print then Give to Patient   RxID:   [id number] ADDERALL 30 MG TABS (AMPHETAMINE-DEXTROAMPHETAMINE) One tablet by mouth three times a day  #90 x 0   Entered and Authorized by:   Lehman Prom FNP   Signed by:   Lehman Prom FNP on 07/26/2010   Method used:   Print then Give to Patient   RxID:   [id number] XANAX 1 MG  TABS (ALPRAZOLAM) One tablet by mouth three times a day  #90 x 0   Entered and Authorized by:   Lehman Prom FNP   Signed by:   Lehman Prom FNP on 07/26/2010   Method used:   Print then Give to Patient   RxID:  [id number] SEROQUEL 50 MG TABS (QUETIAPINE FUMARATE) One tablet by mouth nightly  #30 x 0   Entered and Authorized by:   Lehman Prom FNP   Signed by:   Lehman Prom FNP on 07/26/2010   Method used:   Print then Give to Patient   RxID:   [id number] OXYCODONE HCL 15 MG TABS (OXYCODONE HCL) One tablet by mouth three times a day for pain  #90 x 0   Entered and Authorized by:   Lehman Prom FNP   Signed by:   Lehman Prom FNP on 07/26/2010   Method used:   Print then Give to Patient   RxID:    [id number] CYMBALTA 60 MG CPEP (DULOXETINE HCL) One capsule by mouth daily  #30 x 0   Entered and Authorized by:   Lehman Prom FNP   Signed by:   Lehman Prom FNP on 07/26/2010   Method used:   Print then Give to Patient   RxID:   [id number] ADDERALL 30 MG TABS (AMPHETAMINE-DEXTROAMPHETAMINE) One tablet by mouth three times a day  #90 x 0   Entered and Authorized by:   Lehman Prom FNP   Signed by:   Lehman Prom FNP on 07/26/2010   Method used:   Print then Give to Patient   RxID:   [id number] JJOAC 1 MG  TABS (ALPRAZOLAM) One tablet by mouth three times a day  #90 x 0   Entered and Authorized by:   Lehman Prom FNP   Signed by:   Lehman Prom FNP on 07/26/2010   Method used:   Print then Give to Patient   RxID:   [id number]    Influenza Vaccine    Vaccine Type: Fluvax 3+    Site: left deltoid    Mfr: GlaxoSmithKline    Dose: 0.5 ml    Route: IM    Given by: Armenia Shannon    Exp. Date: 03/2011    Lot #: [id number]    VIS given: 05/08/10 version given July 26, 2010.  Flu Vaccine Consent Questions    Do you have a history of severe allergic reactions to this vaccine? no    Any prior history of allergic reactions to egg and/or gelatin? no    Do you have a sensitivity to the preservative Thimersol? no    Do you have a past history of Guillan-Barre Syndrome? no    Do you currently have an acute febrile illness? no    Have you ever had a severe reaction to latex? no    Vaccine information given and explained to patient? yes    Are you currently pregnant? no

## 2010-11-15 NOTE — Progress Notes (Signed)
Summary: TRIAGE  Phone Note Call from Patient Call back at Home Phone [phone]   Call For: Dr Arlyce Dice Summary of Call: Pt. is scheduled for a Colon w/Propofol today at 1:30pm--having problems Initial call taken by: Leanor Kail [facility],  November 01, 2009 10:55 AM  Follow-up for Phone Call        Pt. took 1st part of Movi prep last night and hasn't passed ANYTHING per rectum. She hasn't done the prep this morning because she is so bloated and nauseated. States she is in severe abd. pain, has been crying all morning. She went to [facility] ER yesterday with abd. pain, they told her she has a bacterial infection in her vagina and she was given antibiotics, she hasn't started them yet due to the prep.  Also states she is passing green mucus from rectum and is coughing up green mucus. Pt. saw Amy on 10-26-09.  [facility]  PLEASE REVIEW AND ADVISE    Follow-up by: Laureen Ochs LPN,  November 01, 2009 11:20 AM  Additional Follow-up for Phone Call Additional follow up Details #1::        Will  have to postpone colo.  Have Amy see her in the office today and see whether we can reprep her for tomorrow Additional Follow-up by: Louis Meckel MD,  November 01, 2009 12:54 PM    Additional Follow-up for Phone Call Additional follow up Details #2::    Above MD orders reviewed with patient. Pt. will see [facility] today at 2:30pm.  Follow-up by: Laureen Ochs LPN,  November 01, 2009 1:03 PM

## 2010-11-15 NOTE — Progress Notes (Signed)
Summary: Pain Clinic Referral   Phone Note Outgoing Call   Summary of Call: F.Y.I. i send [name] to [facility] 09-27-10 and today I receive a fax from them saying that the doctors reviewed the referral and they felt that they have anything to offer her and they made a decision that will not be able to see her at this time . *I send a new referral to [facility] lets see what they said. and [name] haven't returned my call  Initial call taken by: [name],  October 04, 2010 12:07 PM  Follow-up for Phone Call        reviewed thanks - I know her referrals have been difficult Follow-up by: Lehman Prom FNP,  October 04, 2010 2:15 PM

## 2010-11-15 NOTE — Progress Notes (Signed)
  Phone Note Outgoing Call   Call placed by: Joselyn Glassman,  January 02, 2010 11:00 AM Call placed to: Patient Summary of Call: I LM for pt about repeating the CT scan that was done Jan 2011.  I called Rose at [employer] and she told me the pt had one done at [facility] 12-25-09.  I showed it to [employer] and she told him to send it to be scanned which I did sent it down to be scanned.  She will let Dr. Arlyce Dice know and route it to him for him to review.  I called the pt back and she thanked me for being efficient.  I told her we will call her if Dr. Arlyce Dice has any instructions for her. Initial call taken by: Joselyn Glassman,  January 02, 2010 11:03 AM

## 2010-11-15 NOTE — Miscellaneous (Signed)
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

## 2010-11-15 NOTE — Letter (Signed)
Summary: *HSN Results Follow up  [facility]  [address] [place], [place] [address]   Phone: [phone]  Fax: [phone]      09/19/2010   [name] [address] [place], [place]  [address]   Dear  Ms. [name],                            ____S.Drinkard,FNP   ____D. Gore,FNP       ____B. McPherson,MD   ____V. Rankins,MD    ____E. Mulberry,MD    _X___N. Daphine Deutscher, FNP  ____D. Reche Dixon, MD    ____K. Philipp Deputy, MD    ____Other     This letter is to inform you that your recent test(s):  _______Pap Smear    _______Lab Test     _______X-ray    _______ is within acceptable limits  _______ requires a medication change  _______ requires a follow-up lab visit  _______ requires a follow-up visit with your provider   Comments: WE HAVE BEEN TRYING TO REACH YOU REGARDING TO YOUR REFERRAL TO PAIN CLINIC  [facility]  PLEASE, CALL [phone] TO MADE AN APPT . THEY MAILED YOU A LETTER ON 08-29-10 AND THEY WAITING FOR YOU TO CALL  THANK YOU         _________________________________________________________ If you have any questions, please contact our office                     Sincerely,  [name] [facility]

## 2010-11-15 NOTE — Progress Notes (Signed)
Summary: Test results  Phone Note Call from Patient Call back at Home Phone [phone]   Caller: Patient Call For: [name] Reason for Call: Lab or Test Results Summary of Call: Pt. is calling about her CT scan results Initial call taken by: Karna Christmas,  January 05, 2010 9:24 AM  Follow-up for Phone Call        This pt saw Amy 10-26-09.  She had a CT of ABD/Pelvis then .  Amy told me pt needed to repeat CT in 3 months.  The pt went to [facility] on her own recently per Dr. Claris Che recommendation due to having problems.  They repeated the CT on 12-25-09 which is in EMR.  The pt wanted you to look at it. She has not gotten the results.  She is planning on having a Hysterectomy with Dr. Lavina Hamman on 01-23-10.  The pt is asking me for the results. Amy is off today.  Would you please look at the CT scan? Follow-up by: Joselyn Glassman,  January 05, 2010 10:08 AM  Additional Follow-up for Phone Call Additional follow up Details #1::        Ct scan looks ok.  Area in question looks more like inflammation only and it has decreased Additional Follow-up by: Louis Meckel MD,  January 05, 2010 11:16 AM

## 2010-11-15 NOTE — Assessment & Plan Note (Signed)
Summary: ABD PAIN & RECTAL BLEED-WENT TO ER/YF   History of Present Illness Visit Type: Initial Consult Primary GI MD: Melvia Heaps MD [facility] Primary Provider: [name], MD Chief Complaint: Abdominal pain, BRB blood, diarrhea History of Present Illness:   35 YO FEMALE NEW TO GI TODAY,REFERRED BY DR Clent Ridges. SHE WENT TO THE ER LAST WEEK WITH C/O ABDOMINAL PAIN;LABS PERTINENT FOR A MOLD NORMOCYTIC ANEMIA WITH HGB 11.7;OTHERWISE NEGATIVE.CT ABD/PELVIS POSITIVE FOR A NEW SOFT TISSUE THICKENING ALONG THE ORIGIN OF THE IMA-NONSPECIFIC IN APPEARANCE,?FOCAL ARTERITIS?PARAGANGLIOMA. SHE HAS MULTIPLE GI C/O,SAYS SHE HAS BEEN HURTING OVER THE PAST 3 MONTHS,WORSE OVER THE PAST 2 WEEKS.SHE HAD AN EPISODE OF PASSING BLOOD AND SOME MUCOUS  WITH HER STOOLS FOR 3 DAYS PRIOR TO THE ER VISIT.BOWEL HABITS IRREGULAR.APPETITE DECREASED,NO WEIGHT LOSS.SHE DESCRIBES LOWER ABDOMINAL PAINS,WORSE ON THE LEFT SIDE,ESPECIALLY AFTER MEALS. SHE HAS HAD A PRIOR HEMORRHOIDECTOMY IN 2007, AND SAYS SHE WAS SEEN AT [facility] AFTER THAT BY A SURGEON WHO TOLD HER SHE HAD A RECTAL PROLAPSE. SHE DID NOT HAVE ANYTHING DONE AS HAD NO INSURANCE. SHE ALSO REMEMBERS HAVING A COLONOSCOPY SEVERAL YEARS AGO IN [place].   GI Review of Systems    Reports abdominal pain, bloating, dysphagia with solids, loss of appetite, and  nausea.      Denies acid reflux, belching, chest pain, dysphagia with liquids, heartburn, vomiting, vomiting blood, weight loss, and  weight gain.      Reports change in bowel habits, constipation, diarrhea, irritable bowel syndrome, rectal bleeding, and  rectal pain.     Denies anal fissure, black tarry stools, diverticulosis, fecal incontinence, heme positive stool, hemorrhoids, jaundice, light color stool, and  liver problems. Preventive Screening-Counseling & Management  Alcohol-Tobacco     Smoking Status: current      Drug Use:  no.      Current Medications (verified): 1)  Vicodin 5-500 Mg Tabs  (Hydrocodone-Acetaminophen) .Marland Kitchen.. 1 Every 6 Hours As Needed 2)  Xanax 1 Mg  Tabs (Alprazolam) .... Three Times A Day 3)  Anamantle Hc 3-0.5 %  Crea (Lidocaine-Hydrocortisone Ace) .... Apply Three Times A Day As Needed 4)  Promethazine Hcl 25 Mg  Tabs (Promethazine Hcl) .... Three Times A Day As Needed Nausea 5)  Relpax 20 Mg Tabs (Eletriptan Hydrobromide) .... As Need For Migraines 6)  Adderall 30 Mg Tabs (Amphetamine-Dextroamphetamine) .... Three Times A Day, May Fill On 10-28-09 7)  Lamictal 25 Mg Tabs (Lamotrigine) .Marland Kitchen.. 1 By Mouth Once Daily  Allergies (verified): 1)  ! Penicillin G Potassium (Penicillin G Potassium) 2)  ! Darvocet  Past History:  Past Medical History: Anemia-NOS Depression ADD HeadacheS Anxiety IBS INTERNAL HEMORRHOIDS/SURGERY 2007  Past Surgical History: PPH  Family History: Family History of Colon Polyps:Mother Family History of Heart Disease: Mother Family History of Irritable Bowel Syndrome:Mother  Social History: Divorced unemployed,81 MONTH OLD CHILD AND AN 30 YEAR OLD Patient currently smokes.  Alcohol Use - no Daily Caffeine Use -3 Illicit Drug Use - no Drug Use:  no  Review of Systems       The patient complains of anxiety-new, back pain, cough, depression-new, fatigue, fever, headaches-new, itching, menstrual pain, muscle pains/cramps, skin rash, sleeping problems, and swelling of feet/legs.  The patient denies allergy/sinus, anemia, arthritis/joint pain, blood in urine, breast changes/lumps, change in vision, confusion, coughing up blood, fainting, hearing problems, heart murmur, heart rhythm changes, night sweats, nosebleeds, pregnancy symptoms, shortness of breath, sore throat, swollen lymph glands, thirst - excessive , urination - excessive , urination  changes/pain, urine leakage, vision changes, and voice change.         ROS OTHERWISE AS IN HPI  Vital Signs:  Patient profile:   35 year old female Height:      67.5 inches Weight:       161.38 pounds BMI:     24.99 Pulse rate:   80 / minute Pulse rhythm:   regular BP sitting:   120 / 68  (left arm) Cuff size:   regular  Vitals Entered By: June McMurray CMA Duncan Dull) (October 26, 2009 10:15 AM)  Physical Exam  General:  Well developed, well nourished, no acute distress. Head:  Normocephalic and atraumatic. Eyes:  PERRLA, no icterus. Lungs:  Clear throughout to auscultation. Heart:  Regular rate and rhythm; no murmurs, rubs,  or bruits. Abdomen:  SOFT, TENDER LLQ/LMQ NO GUARDING OR REBOUND, NO MASS OR HSM,BS+ Rectal:  TRACE HEME POSITIVE,NO EXTERNAL HEMORRHOIDS,FULLNESS IN ANAL CANAL-NO OBVIOUS PROLAPSE Extremities:  No clubbing, cyanosis, edema or deformities noted. Neurologic:  Alert and  oriented x4;  grossly normal neurologically. Psych:  Alert and cooperative. Normal mood and affect.depressed affect.     Impression & Recommendations:  Problem # 1:  ABDOMINAL PAIN-LLQ (ICD-789.04) Assessment New 35 YO FEMALE WITH 3 MONTH HX OF LOWER ABDOMINAL PAIN,RECENT HEMATOCHZIA AND INCREWASE IN PAIN. CT THRU ER UNREVEALING.PT WITH MILD NORMOCYTIC ANEMIA. ETIOLOGY NOT CLEAR,R/O IBD,R/O IBS WITH ANORECTAL SOURCE FOR HEMATOCHEZIA  SCHEDULE COLONOSCOPY WITH DR KAPLAN (PROPAFOL)PROCEDURE DISCUSSED IN DETAIL WITH PT OBTAIN OLD RECORDS FROM [facility] AND PREVIOUS COLONOSCOPY TRIAL OF BENTYL 10 MG 3 X DAILY BEFORE MEALS.  Problem # 2:  ANXIETY (ICD-300.00) Assessment: Comment Only CHRONIC  Problem # 3:  ADD (ICD-314.00) Assessment: Comment Only  Problem # 4:  ABNORMAL FINDINGS GI TRACT (ICD-793.4) Assessment: Comment Only CT SCAN WITH NONSPECIFIC SOFT TISSUE  THICKENING AT THE ORIGIN OF THE IMA. R/O FOCAL ARTERITIS,R/OPARAGANGLIOMA  WILL NEED F/U CT IN 3-4 MONTHS   Other Orders: ZCOL (ZCOL)  Patient Instructions: 1)  Scheduled the Colonoscopy with Dr. Arlyce Dice at [facility] on 11-01-09 at [facility].  Scheduled with Propofol sedation. 2)  Sent perscription for the Moviprep to your  pharmacy. 3)  Colonoscopy and conscous sedation brochure provided. Prescriptions: MOVIPREP 100 GM  SOLR (PEG-KCL-NACL-NASULF-NA ASC-C) As per prep instructions.  #1 x 0   Entered by:   Lowry Ram NCMA   Authorized by:   Sammuel Cooper PA-c   Signed by:   Lowry Ram NCMA on 10/26/2009   Method used:   Electronically to        [facility]  [address] [phone]* (retail)       [address]       [place], [place]  [address]       Ph: [phone]       Fax: [phone]   RxID:   [id number]

## 2010-11-15 NOTE — Letter (Signed)
Summary: Discharge Summary  Discharge Summary   Imported By: Arta Bruce 10/30/2010 10:21:18  _____________________________________________________________________  External Attachment:    Type:   Image     Comment:   External Document

## 2010-11-15 NOTE — Progress Notes (Signed)
Summary: fyi  Phone Note Call from Patient Call back at Home Phone [phone]   Caller: Patient---live call Summary of Call: [facility], fiance' will pick up rx. Initial call taken by: Warnell Forester,  Feb 16, 2010 4:17 PM

## 2010-11-15 NOTE — Procedures (Signed)
Summary: Upper Endoscopy  Patient: [name] Note: All result statuses are Final unless otherwise noted.  Tests: (1) Upper Endoscopy (EGD)   EGD Upper Endoscopy       DONE     [facility]     [address]     [place], [place]  [address]           ENDOSCOPY PROCEDURE REPORT           PATIENT:  [name], [name]  MR#:  [id number]     BIRTHDATE:  Oct 25, 1975, 34 yrs. old  GENDER:  female           ENDOSCOPIST:  Barbette Hair. Arlyce Dice, MD     Referred by:           PROCEDURE DATE:  06/29/2010     PROCEDURE:  EGD with biopsy     ASA CLASS:  Class II     INDICATIONS:  abdominal pain           MEDICATIONS:   Fentanyl 50 mcg IV, Versed 5 mg IV, Benadryl 50 mg     IV, glycopyrrolate (Robinal) 0.2 mg IV, 0.6cc simethancone 0.6 cc     PO     TOPICAL ANESTHETIC:  Exactacain Spray           DESCRIPTION OF PROCEDURE:   After the risks benefits and     alternatives of the procedure were thoroughly explained, informed     consent was obtained.  The LB GIF-H180 [id number] endoscope was     introduced through the mouth and advanced to the third portion of     the duodenum, without limitations.  The instrument was slowly     withdrawn as the mucosa was fully examined.     <<PROCEDUREIMAGES>>           An ulcer was found at the prepylorus. Superficial linear     prepyloric ulcer with clean base. Bxs taken (see image6).  An     ulcer was found in the distal esophagus (see image8). Linear     superficial ulcer  Otherwise the examination was normal (see     image1, image2, image3, image7, and image10).    Retroflexed views     revealed no abnormalities.    The scope was then withdrawn from     the patient and the procedure completed.           COMPLICATIONS:  None           ENDOSCOPIC IMPRESSION:     1) Ulcer at the prepylorus     2) Ulcer in the distal esophagus     3) Otherwise normal examination           Ulcers are likely related to Goodys powder use     RECOMMENDATIONS:     1)  continue PPI     2) hold Goody powder, NSAIDS     2) Call office next 2-3 days to schedule an office appointment     for 3 weeks           REPEAT EXAM:  No           ______________________________     Barbette Hair. Arlyce Dice, MD           CC:  Nelwyn Salisbury, MD           n.     Rosalie DoctorBarbette Hair. Kaplan at 06/29/2010 01:52 PM  [name], [name], [id number]  Note: An exclamation mark (!) indicates a result that was not dispersed into the flowsheet. Document Creation Date: 06/29/2010 1:53 PM _______________________________________________________________________  (1) Order result status: Final Collection or observation date-time: 06/29/2010 13:45 Requested date-time:  Receipt date-time:  Reported date-time:  Referring Physician:   Ordering Physician: Melvia Heaps [phone]) Specimen Source:  Source: Launa Grill Order Number: [id number] Lab site:

## 2010-11-15 NOTE — Letter (Signed)
Summary: Results Letter  [facility]  [address] [place], [place] [address]   Phone: [phone]  Fax: [phone]        July 04, 2010 MRN: [id number]    [facility] [address] [place], [place]  [address]    Dear Ms. Lopiccolo,   Your biopsies demonstrated inflammatory changes only.    Please follow the recommendations previously discussed.  Should you have any immediate concerns or questions, feel free to contact me at the office.    Sincerely,  Barbette Hair. Arlyce Dice, M.D., [facility]          Sincerely,  Louis Meckel MD  This letter has been electronically signed by your physician.  Appended Document: Results Letter letter mailed

## 2010-11-15 NOTE — Assessment & Plan Note (Signed)
Summary: abd pain/dm   Vital Signs:  Patient profile:   35 year old female Weight:      156 pounds Temp:     98.3 degrees F oral BP sitting:   110 / 78  (left arm) Cuff size:   regular  Vitals Entered By: Raechel Ache, RN (April 04, 2010 11:50 AM) CC: C/o swollen belly, abd pain and rect   History of Present Illness: Here for several reasons, primarily abdominal pains. For several days she had has bloating, cramps, diarrhea, and some bright red blood in the stools. No vomiting or fever. Her last colonoscopy in February was clear except for some hemorrhoids. We had tried to get her in to see Dr. Sherri Rad for another GI evaluation, but he does not accept Medicaid. She recently had a complicated hysterectomy.   Allergies: 1)  ! Penicillin G Potassium (Penicillin G Potassium) 2)  ! Darvocet  Past History:  Past Medical History: Anemia-NOS Depression ADD Headaches Anxiety IBS chronic abdominal pain, sees Dr. Melvia Heaps and Sondra Come PA chronic low back pain sees Dr. Jarold Song for GYN exams  Past Surgical History: Reviewed history from 03/16/2010 and no changes required. PPH colonoscopy 11-15-09 per Dr. Arlyce Dice, clear except for some hemorrhoids internal hemorrhoid surgery laparoscopic assisted vaginal hysterectomy with A and P repair 02-27-10 per Dr. Jarold Song  Review of Systems  The patient denies anorexia, fever, weight loss, weight gain, vision loss, decreased hearing, hoarseness, chest pain, syncope, dyspnea on exertion, peripheral edema, prolonged cough, headaches, hemoptysis, melena, severe indigestion/heartburn, hematuria, incontinence, genital sores, muscle weakness, suspicious skin lesions, transient blindness, difficulty walking, depression, unusual weight change, abnormal bleeding, enlarged lymph nodes, angioedema, breast masses, and testicular masses.    Physical Exam  General:  Well-developed,well-nourished,in no acute distress; alert,appropriate and  cooperative throughout examination Lungs:  Normal respiratory effort, chest expands symmetrically. Lungs are clear to auscultation, no crackles or wheezes. Heart:  Normal rate and regular rhythm. S1 and S2 normal without gallop, murmur, click, rub or other extra sounds. Abdomen:  soft, normal bowel sounds, no masses, no guarding, no rigidity, no rebound tenderness, no abdominal hernia, no inguinal hernia, no hepatomegaly, and no splenomegaly.  MIldly distended and mildly tender in all areas.    Impression & Recommendations:  Problem # 1:  RECTAL BLEEDING (ICD-569.3)  Orders: Gastroenterology Referral (GI)  Problem # 2:  ABDOMINAL PAIN, GENERALIZED (ICD-789.07)  Complete Medication List: 1)  Xanax 1 Mg Tabs (Alprazolam) .... Three times a day 2)  Promethazine Hcl 25 Mg Tabs (Promethazine hcl) .... Three times a day as needed nausea 3)  Adderall 30 Mg Tabs (Amphetamine-dextroamphetamine) .... Three times a day, may fill on 05-16-10 4)  Maxalt 10 Mg Tabs (Rizatriptan benzoate) .... As needed for migraines 5)  Cymbalta 60 Mg Cpep (Duloxetine hcl) .... Once daily 6)  Oxycodone Hcl 15 Mg Tabs (Oxycodone hcl) .... One four times a day as needed pain 7)  Ativan 1 Mg Tabs (Lorazepam) .... Three times a day as needed anxiety 8)  Dicyclomine Hcl 20 Mg Tabs (Dicyclomine hcl) .... Three times a day as needed stomach cramps 9)  Omeprazole 40 Mg Cpdr (Omeprazole) .... Once daily  Patient Instructions: 1)  She has known IBS but may also have some duodenal ulcer disease. Try Omeprazole daily. Will refer her to a different GI.  Prescriptions: OMEPRAZOLE 40 MG CPDR (OMEPRAZOLE) once daily  #30 x 5   Entered and Authorized by:   Nelwyn Salisbury MD   Signed by:  Nelwyn Salisbury MD on 04/04/2010   Method used:   Print then Give to Patient   RxID:   [id number] DICYCLOMINE HCL 20 MG TABS (DICYCLOMINE HCL) three times a day as needed stomach cramps  #60 x 5   Entered and Authorized by:   Nelwyn Salisbury  MD   Signed by:   Nelwyn Salisbury MD on 04/04/2010   Method used:   Print then Give to Patient   RxID:   [id number] ATIVAN 1 MG TABS (LORAZEPAM) three times a day as needed anxiety  #90 x 2   Entered and Authorized by:   Nelwyn Salisbury MD   Signed by:   Nelwyn Salisbury MD on 04/04/2010   Method used:   Print then Give to Patient   RxID:   [id number]

## 2010-11-29 NOTE — Letter (Signed)
Summary: SOCIAL WORK SUMMARY  SOCIAL WORK SUMMARY   Imported By: Arta Bruce 11/20/2010 10:20:19  _____________________________________________________________________  External Attachment:    Type:   Image     Comment:   External Document

## 2010-12-19 ENCOUNTER — Ambulatory Visit (INDEPENDENT_AMBULATORY_CARE_PROVIDER_SITE_OTHER): Payer: Medicaid Other | Admitting: Family Medicine

## 2010-12-19 ENCOUNTER — Encounter: Payer: Self-pay | Admitting: Family Medicine

## 2010-12-19 DIAGNOSIS — M26629 Arthralgia of temporomandibular joint, unspecified side: Secondary | ICD-10-CM

## 2010-12-19 DIAGNOSIS — H6980 Other specified disorders of Eustachian tube, unspecified ear: Secondary | ICD-10-CM

## 2010-12-24 LAB — BASIC METABOLIC PANEL
BUN: 6 mg/dL (ref 6–23)
CO2: 27 mEq/L (ref 19–32)
Calcium: 9 mg/dL (ref 8.4–10.5)
Chloride: 102 mEq/L (ref 96–112)
Creatinine, Ser: 0.63 mg/dL (ref 0.4–1.2)
GFR calc Af Amer: >60 mL/min (ref >60)
GFR calc non Af Amer: >60 mL/min (ref >60)
Glucose, Bld: 111 mg/dL — ABNORMAL HIGH (ref 70–99)
Potassium: 3.7 mEq/L (ref 3.5–5.1)
Sodium: 137 mEq/L (ref 135–145)

## 2010-12-24 LAB — DIFFERENTIAL
Basophils Absolute: 0 10*3/uL (ref 0.0–0.1)
Basophils Relative: 0 % (ref 0–1)
Eosinophils Absolute: 0.3 10*3/uL (ref 0.0–0.7)
Eosinophils Relative: 3 % (ref 0–5)
Lymphocytes Relative: 31 % (ref 12–46)
Lymphs Abs: 3.8 10*3/uL (ref 0.7–4.0)
Monocytes Absolute: 0.6 10*3/uL (ref 0.1–1.0)
Monocytes Relative: 5 % (ref 3–12)
Neutro Abs: 7.6 10*3/uL (ref 1.7–7.7)
Neutrophils Relative %: 61 % (ref 43–77)

## 2010-12-24 LAB — CBC
HCT: 40.8 % (ref 36.0–46.0)
Hemoglobin: 13.5 g/dL (ref 12.0–15.0)
MCH: 27.1 pg (ref 26.0–34.0)
MCHC: 33.1 g/dL (ref 30.0–36.0)
MCV: 81.8 fL (ref 78.0–100.0)
Platelets: 241 10*3/uL (ref 150–400)
RBC: 4.99 MIL/uL (ref 3.87–5.11)
RDW: 16 % — ABNORMAL HIGH (ref 11.5–15.5)
WBC: 12.4 10*3/uL — ABNORMAL HIGH (ref 4.0–10.5)

## 2010-12-24 LAB — RAPID URINE DRUG SCREEN, HOSP PERFORMED
Amphetamines: POSITIVE — AB
Barbiturates: NOT DETECTED
Benzodiazepines: POSITIVE — AB
Cocaine: NOT DETECTED
Opiates: POSITIVE — AB
Tetrahydrocannabinol: NOT DETECTED

## 2010-12-24 LAB — TRICYCLICS SCREEN, URINE: TCA Scrn: NOT DETECTED

## 2010-12-24 LAB — OVA AND PARASITE EXAMINATION: Ova and parasites: NONE SEEN

## 2010-12-24 LAB — ETHANOL: Alcohol, Ethyl (B): <5 mg/dL (ref 0–10)

## 2010-12-25 NOTE — Assessment & Plan Note (Signed)
Summary: RIGHT EAR BLEEDING?NH Room 4   Vital Signs:  Patient Profile:   35 Years Old Female CC:      Rt ear pain, bleeding, pressure in head x 2 wks Height:     67.5 inches Weight:      159 pounds O2 Sat:      96 % O2 treatment:    Room Air Temp:     99.0 degrees F oral Pulse rate:   87 / minute Pulse rhythm:   regular Resp:     14 per minute BP sitting:   123 / 85  (left arm) Cuff size:   regular  Vitals Entered By: Emilio Math (December 19, 2010 8:39 AM)                  Current Allergies (reviewed today): ! PENICILLIN G POTASSIUM (PENICILLIN G POTASSIUM) ! DARVOCET MORPHINEHistory of Present Illness Chief Complaint: Rt ear pain, bleeding, pressure in head x 2 wks History of Present Illness:  Subjective: Patient complains of her ears feeling clogged for about two weeks, and now right ear pain that started last night.  She notes that she has a history of TMJ pain.  No cough No pleuritic pain No wheezing + nasal congestion ? post-nasal drainage ? sinus pain/pressure No itchy/red eyes No hemoptysis No SOB No fever, + chills for 3 days + nausea; she requests med for nausea No vomiting No abdominal pain No diarrhea No skin rashes + fatigue No myalgias No headache Used OTC meds without relief   REVIEW OF SYSTEMS Constitutional Symptoms       Complains of fever.     Denies chills, night sweats, weight loss, weight gain, and fatigue.  Eyes       Denies change in vision, eye pain, eye discharge, glasses, contact lenses, and eye surgery. Ear/Nose/Throat/Mouth       Complains of change in hearing, ear pain, and sinus problems.      Denies hearing loss/aids, ear discharge, dizziness, frequent runny nose, frequent nose bleeds, sore throat, hoarseness, and tooth pain or bleeding.  Respiratory       Denies dry cough, productive cough, wheezing, shortness of breath, asthma, bronchitis, and emphysema/COPD.  Cardiovascular       Denies murmurs, chest pain, and tires  easily with exhertion.    Gastrointestinal       Denies stomach pain, nausea/vomiting, diarrhea, constipation, blood in bowel movements, and indigestion. Genitourniary       Denies painful urination, kidney stones, and loss of urinary control. Neurological       Denies paralysis, seizures, and fainting/blackouts. Musculoskeletal       Denies muscle pain, joint pain, joint stiffness, decreased range of motion, redness, swelling, muscle weakness, and gout.  Skin       Denies bruising, unusual mles/lumps or sores, and hair/skin or nail changes.  Psych       Denies mood changes, temper/anger issues, anxiety/stress, speech problems, depression, and sleep problems.  Past History:  Past Medical History: Reviewed history from 07/16/2010 and no changes required. Anemia- Depression ADD Headaches Anxiety  chronic abdominal pain, sees Dr. Melvia Heaps and Sondra Come PA chronic low back pain sees Dr. Jarold Song for GYN exams  Past Surgical History: Reviewed history from 04/25/2010 and no changes required. PPH to tack up internal hemorrhoids 2004 per Dr. Luisa Hart colonoscopy 11-15-09 per Dr. Arlyce Dice, clear except for some hemorrhoids laparoscopic assisted vaginal hysterectomy with A and P repair 02-27-10 per Dr. Jarold Song  Family  History: Reviewed history from 10/26/2009 and no changes required. Family History of Colon Polyps:Mother Family History of Heart Disease: Mother Family History of Irritable Bowel Syndrome:Mother  Social History: Reviewed history from 07/26/2010 and no changes required. Divorced unemployed, 49  MONTH OLD CHILD AND AN 22 YEAR OLD,15 YO Patient currently smokes.  Alcohol Use - no Daily Caffeine Use -3 Illicit Drug Use - no   Objective:  Appearance:  Patient appears healthy, stated age, and in no acute distress  Eyes:  Pupils are equal, round, and reactive to light and accomdation.  Extraocular movement is intact.  Conjunctivae are not inflamed.  Ears:   Canals normal.  Tympanic membranes normal.  Right temporomandibular joint has distinct tenderness to palpation Nose:  Mildly congested;  no sinus tenderness Pharynx:  Normal  Neck:  Supple.  Slightly tender shotty anterior/posterior nodes are palpated bilaterally.  Lungs:  Clear to auscultation.  Breath sounds are equal.  Heart:  Regular rate and rhythm without murmurs, rubs, or gallops.  Abdomen:  Nontender without masses or hepatosplenomegaly.  Bowel sounds are present.  No CVA or flank tenderness.  Skin:  No rash Assessment  Assessed TEMPOROMANDIBULAR JOINT PAIN as deteriorated - Donna Christen MD New Problems: DYSFUNCTION OF EUSTACHIAN TUBE (ICD-381.81)  EAR SYMPTOMS RESULT FROM COMBINATION OF ET DYSFUNCTION AND TMJ PAIN.  SUSPECT EARLY VIRAL URI ALSO  Plan New Medications/Changes: ZOFRAN 4 MG TABS (ONDANSETRON HCL) One tab by mouth q4 to 6hr as needed nausea  #12 (twelve) x 0, 12/19/2010, Donna Christen MD AMOXICILLIN 875 MG TABS (AMOXICILLIN) One by mouth two times a day (Rx void after 12/27/10)  #20 x 0, 12/19/2010, Donna Christen MD PREDNISONE 10 MG TABS (PREDNISONE) 2 PO BID for 2 days, then 1 BID for 2 days, then 1 daily for 2 days.  Take PC  #14 x 0, 12/19/2010, Donna Christen MD  New Orders: Est. Patient Level III [phone] Planning Comments:   Begin tapering course of prednisone. Treat symptomatically for now:  Increase fluid intake, begin expectorant/decongestant.   If fever/chills/sweats develop, or if not improving 5 to 7 days begin amoxicillin (given Rx to hold).  She notes that she has taken amoxicillin in the past without adverse effect. Zofran for nausea.    Followup with PCP if not improving 10 to 14 days.   The patient and/or caregiver has been counseled thoroughly with regard to medications prescribed including dosage, schedule, interactions, rationale for use, and possible side effects and they verbalize understanding.  Diagnoses and expected course of recovery  discussed and will return if not improved as expected or if the condition worsens. Patient and/or caregiver verbalized understanding.  Prescriptions: ZOFRAN 4 MG TABS (ONDANSETRON HCL) One tab by mouth q4 to 6hr as needed nausea  #12 (twelve) x 0   Entered and Authorized by:   Donna Christen MD   Signed by:   Donna Christen MD on 12/19/2010   Method used:   Print then Give to Patient   RxID:   [id number] AMOXICILLIN 875 MG TABS (AMOXICILLIN) One by mouth two times a day (Rx void after 12/27/10)  #20 x 0   Entered and Authorized by:   Donna Christen MD   Signed by:   Donna Christen MD on 12/19/2010   Method used:   Print then Give to Patient   RxID:   [id number] PREDNISONE 10 MG TABS (PREDNISONE) 2 PO BID for 2 days, then 1 BID for 2 days, then 1 daily for 2 days.  Take PC  #  14 x 0   Entered and Authorized by:   Donna Christen MD   Signed by:   Donna Christen MD on 12/19/2010   Method used:   Print then Give to Patient   RxID:   [id number]   Patient Instructions: 1)  Take Mucinex D (guaifenesin with decongestant) twice daily for congestion. 2)  Increase fluid intake, rest. 3)  May use Afrin nasal spray (or generic oxymetazoline) twice daily for about 5 days.  Also recommend using saline nasal spray several times daily and/or saline nasal irrigation. 4)  Begin Amoxicillin if not improving about 5 days or if persistent fever develops. 5)  Followup with family doctor if not improving 7 to 10 days.   Orders Added: 1)  Est. Patient Level III [56213]

## 2010-12-26 LAB — CBC
MCH: 26.1 pg (ref 26.0–34.0)
MCHC: 33.3 g/dL (ref 30.0–36.0)
MCV: 78.4 fL (ref 78.0–100.0)
Platelets: 219 10*3/uL (ref 150–400)
RBC: 4.09 MIL/uL (ref 3.87–5.11)
RDW: 19 % — ABNORMAL HIGH (ref 11.5–15.5)

## 2010-12-26 LAB — COMPREHENSIVE METABOLIC PANEL
AST: 21 U/L (ref 0–37)
Albumin: 2.8 g/dL — ABNORMAL LOW (ref 3.5–5.2)
BUN: 3 mg/dL — ABNORMAL LOW (ref 6–23)
Calcium: 8.2 mg/dL — ABNORMAL LOW (ref 8.4–10.5)
Creatinine, Ser: 0.46 mg/dL (ref 0.4–1.2)
GFR calc Af Amer: >60 mL/min (ref >60)
Total Bilirubin: 0.7 mg/dL (ref 0.3–1.2)
Total Protein: 5.6 g/dL — ABNORMAL LOW (ref 6.0–8.3)

## 2010-12-26 LAB — LIPASE, BLOOD: Lipase: 20 U/L (ref 11–59)

## 2010-12-26 LAB — TISSUE TRANSGLUTAMINASE, IGA: Tissue Transglutaminase Ab, IgA: 16.5 U/mL (ref <20)

## 2010-12-27 LAB — COMPREHENSIVE METABOLIC PANEL
ALT: 10 U/L (ref 0–35)
ALT: 15 U/L (ref 0–35)
AST: 13 U/L (ref 0–37)
AST: 18 U/L (ref 0–37)
Albumin: 3.2 g/dL — ABNORMAL LOW (ref 3.5–5.2)
Albumin: 3.3 g/dL — ABNORMAL LOW (ref 3.5–5.2)
Alkaline Phosphatase: 80 U/L (ref 39–117)
CO2: 26 mEq/L (ref 19–32)
Calcium: 8.5 mg/dL (ref 8.4–10.5)
Chloride: 109 mEq/L (ref 96–112)
Chloride: 104 mEq/L (ref 96–112)
Creatinine, Ser: 0.58 mg/dL (ref 0.4–1.2)
GFR calc Af Amer: >60 mL/min (ref >60)
GFR calc Af Amer: >60 mL/min (ref >60)
Potassium: 3.5 mEq/L (ref 3.5–5.1)
Sodium: 140 mEq/L (ref 135–145)
Sodium: 135 mEq/L (ref 135–145)
Total Bilirubin: 0.4 mg/dL (ref 0.3–1.2)

## 2010-12-27 LAB — CBC
Hemoglobin: 11.5 g/dL — ABNORMAL LOW (ref 12.0–15.0)
MCH: 25.4 pg — ABNORMAL LOW (ref 26.0–34.0)
Platelets: 211 10*3/uL (ref 150–400)
Platelets: 231 10*3/uL (ref 150–400)
RBC: 4.53 MIL/uL (ref 3.87–5.11)
RBC: 4.49 MIL/uL (ref 3.87–5.11)
WBC: 8.2 10*3/uL (ref 4.0–10.5)

## 2010-12-27 LAB — URINALYSIS, ROUTINE W REFLEX MICROSCOPIC
Nitrite: NEGATIVE
Specific Gravity, Urine: 1.004 — ABNORMAL LOW (ref 1.005–1.030)
Urobilinogen, UA: 0.2 mg/dL (ref 0.0–1.0)

## 2010-12-27 LAB — DIFFERENTIAL
Basophils Absolute: 0 10*3/uL (ref 0.0–0.1)
Basophils Relative: 0 % (ref 0–1)
Eosinophils Relative: 3 % (ref 0–5)
Eosinophils Relative: 4 % (ref 0–5)
Lymphocytes Relative: 35 % (ref 12–46)
Lymphocytes Relative: 38 % (ref 12–46)
Lymphs Abs: 3.4 10*3/uL (ref 0.7–4.0)
Monocytes Absolute: 0.6 10*3/uL (ref 0.1–1.0)
Monocytes Relative: 7 % (ref 3–12)

## 2010-12-27 LAB — SAMPLE TO BLOOD BANK

## 2010-12-27 LAB — HEMOCCULT GUIAC POC 1CARD (OFFICE): Fecal Occult Bld: NEGATIVE

## 2010-12-30 LAB — URINALYSIS, ROUTINE W REFLEX MICROSCOPIC
Bilirubin Urine: NEGATIVE
Glucose, UA: NEGATIVE mg/dL
Hgb urine dipstick: NEGATIVE
Hgb urine dipstick: NEGATIVE
Hgb urine dipstick: NEGATIVE
Ketones, ur: 15 mg/dL — AB
Ketones, ur: NEGATIVE mg/dL
Nitrite: NEGATIVE
Nitrite: NEGATIVE
Specific Gravity, Urine: >1.046 — ABNORMAL HIGH (ref 1.005–1.030)
Specific Gravity, Urine: 1.016 (ref 1.005–1.030)
Urobilinogen, UA: 1 mg/dL (ref 0.0–1.0)
Urobilinogen, UA: 0.2 mg/dL (ref 0.0–1.0)
pH: 6 (ref 5.0–8.0)

## 2010-12-30 LAB — CBC
HCT: 34.3 % — ABNORMAL LOW (ref 36.0–46.0)
Hemoglobin: 11.1 g/dL — ABNORMAL LOW (ref 12.0–15.0)
MCH: 25.3 pg — ABNORMAL LOW (ref 26.0–34.0)
MCV: 81.9 fL (ref 78.0–100.0)
Platelets: 265 10*3/uL (ref 150–400)
RBC: 4.4 MIL/uL (ref 3.87–5.11)
RBC: 4.2 MIL/uL (ref 3.87–5.11)
RBC: 4.55 MIL/uL (ref 3.87–5.11)
WBC: 10.1 10*3/uL (ref 4.0–10.5)
WBC: 9.3 10*3/uL (ref 4.0–10.5)

## 2010-12-30 LAB — COMPREHENSIVE METABOLIC PANEL
ALT: 11 U/L (ref 0–35)
AST: 15 U/L (ref 0–37)
AST: 17 U/L (ref 0–37)
Albumin: 3.2 g/dL — ABNORMAL LOW (ref 3.5–5.2)
Alkaline Phosphatase: 91 U/L (ref 39–117)
Alkaline Phosphatase: 65 U/L (ref 39–117)
CO2: 22 mEq/L (ref 19–32)
CO2: 23 mEq/L (ref 19–32)
Chloride: 111 mEq/L (ref 96–112)
Chloride: 107 mEq/L (ref 96–112)
Creatinine, Ser: 0.51 mg/dL (ref 0.4–1.2)
GFR calc Af Amer: >60 mL/min (ref >60)
GFR calc Af Amer: >60 mL/min (ref >60)
GFR calc non Af Amer: >60 mL/min (ref >60)
GFR calc non Af Amer: >60 mL/min (ref >60)
Potassium: 3.1 mEq/L — ABNORMAL LOW (ref 3.5–5.1)
Potassium: 3.8 mEq/L (ref 3.5–5.1)
Sodium: 141 mEq/L (ref 135–145)
Total Bilirubin: 0.3 mg/dL (ref 0.3–1.2)
Total Bilirubin: 0.2 mg/dL — ABNORMAL LOW (ref 0.3–1.2)

## 2010-12-30 LAB — HEPATIC FUNCTION PANEL
AST: 27 U/L (ref 0–37)
Alkaline Phosphatase: 80 U/L (ref 39–117)
Bilirubin, Direct: <0.1 mg/dL (ref 0.0–0.3)
Total Bilirubin: 0.2 mg/dL — ABNORMAL LOW (ref 0.3–1.2)

## 2010-12-30 LAB — BASIC METABOLIC PANEL
Calcium: 8.5 mg/dL (ref 8.4–10.5)
GFR calc non Af Amer: >60 mL/min (ref >60)
Glucose, Bld: 122 mg/dL — ABNORMAL HIGH (ref 70–99)
Potassium: 3.7 mEq/L (ref 3.5–5.1)
Sodium: 139 mEq/L (ref 135–145)

## 2010-12-30 LAB — DIFFERENTIAL
Basophils Absolute: 0.1 10*3/uL (ref 0.0–0.1)
Basophils Absolute: 0.1 10*3/uL (ref 0.0–0.1)
Basophils Relative: 1 % (ref 0–1)
Basophils Relative: 1 % (ref 0–1)
Eosinophils Absolute: 0.2 10*3/uL (ref 0.0–0.7)
Eosinophils Absolute: 0.1 10*3/uL (ref 0.0–0.7)
Eosinophils Relative: 2 % (ref 0–5)
Eosinophils Relative: 2 % (ref 0–5)
Lymphocytes Relative: 24 % (ref 12–46)
Lymphs Abs: 1.9 10*3/uL (ref 0.7–4.0)
Monocytes Absolute: 0.6 10*3/uL (ref 0.1–1.0)
Monocytes Relative: 5 % (ref 3–12)
Neutro Abs: 6.7 10*3/uL (ref 1.7–7.7)
Neutrophils Relative %: 72 % (ref 43–77)

## 2010-12-30 LAB — URINE MICROSCOPIC-ADD ON

## 2010-12-30 LAB — APTT: aPTT: 31 seconds (ref 24–37)

## 2010-12-30 LAB — LIPASE, BLOOD: Lipase: 14 U/L (ref 11–59)

## 2010-12-30 LAB — WET PREP, GENITAL
Trich, Wet Prep: NONE SEEN
Yeast Wet Prep HPF POC: NONE SEEN

## 2010-12-30 LAB — POCT PREGNANCY, URINE
Preg Test, Ur: NEGATIVE
Preg Test, Ur: NEGATIVE

## 2010-12-30 LAB — PREGNANCY, URINE: Preg Test, Ur: NEGATIVE

## 2010-12-30 LAB — URINE CULTURE

## 2010-12-30 LAB — GC/CHLAMYDIA PROBE AMP, GENITAL: GC Probe Amp, Genital: NEGATIVE

## 2010-12-31 LAB — CBC
HCT: 36.6 % (ref 36.0–46.0)
Hemoglobin: 12.4 g/dL (ref 12.0–15.0)
MCV: 81.5 fL (ref 78.0–100.0)
MCV: 81.6 fL (ref 78.0–100.0)
Platelets: 218 10*3/uL (ref 150–400)
Platelets: 250 10*3/uL (ref 150–400)
Platelets: 270 10*3/uL (ref 150–400)
RBC: 3.65 MIL/uL — ABNORMAL LOW (ref 3.87–5.11)
RBC: 3.44 MIL/uL — ABNORMAL LOW (ref 3.87–5.11)
WBC: 10.4 10*3/uL (ref 4.0–10.5)
WBC: 17.6 10*3/uL — ABNORMAL HIGH (ref 4.0–10.5)
WBC: 8.4 10*3/uL (ref 4.0–10.5)

## 2010-12-31 LAB — CREATININE, SERUM
Creatinine, Ser: 0.43 mg/dL (ref 0.4–1.2)
Creatinine, Ser: 0.5 mg/dL (ref 0.4–1.2)
GFR calc Af Amer: >60 mL/min (ref >60)
GFR calc Af Amer: >60 mL/min (ref >60)
GFR calc non Af Amer: >60 mL/min (ref >60)
GFR calc non Af Amer: >60 mL/min (ref >60)

## 2010-12-31 LAB — URINALYSIS, ROUTINE W REFLEX MICROSCOPIC
Bilirubin Urine: NEGATIVE
Glucose, UA: NEGATIVE mg/dL
Ketones, ur: NEGATIVE mg/dL
Protein, ur: NEGATIVE mg/dL
pH: 5.5 (ref 5.0–8.0)

## 2010-12-31 LAB — COMPREHENSIVE METABOLIC PANEL
ALT: 14 U/L (ref 0–35)
AST: 15 U/L (ref 0–37)
Albumin: 3 g/dL — ABNORMAL LOW (ref 3.5–5.2)
CO2: 27 mEq/L (ref 19–32)
Chloride: 106 mEq/L (ref 96–112)
Creatinine, Ser: 0.47 mg/dL (ref 0.4–1.2)
GFR calc Af Amer: >60 mL/min (ref >60)
GFR calc non Af Amer: >60 mL/min (ref >60)
Potassium: 3.5 mEq/L (ref 3.5–5.1)
Sodium: 139 mEq/L (ref 135–145)
Total Bilirubin: 0.2 mg/dL — ABNORMAL LOW (ref 0.3–1.2)

## 2010-12-31 LAB — BUN: BUN: <1 mg/dL — ABNORMAL LOW (ref 6–23)

## 2010-12-31 LAB — DIFFERENTIAL
Basophils Absolute: 0 10*3/uL (ref 0.0–0.1)
Eosinophils Absolute: 0.4 10*3/uL (ref 0.0–0.7)
Eosinophils Relative: 5 % (ref 0–5)
Lymphocytes Relative: 24 % (ref 12–46)
Lymphs Abs: 2 10*3/uL (ref 0.7–4.0)
Monocytes Absolute: 0.5 10*3/uL (ref 0.1–1.0)

## 2011-01-07 LAB — DIFFERENTIAL
Basophils Relative: 1 % (ref 0–1)
Eosinophils Relative: 3 % (ref 0–5)
Monocytes Absolute: 0.7 10*3/uL (ref 0.1–1.0)
Monocytes Relative: 9 % (ref 3–12)
Neutro Abs: 4.8 10*3/uL (ref 1.7–7.7)

## 2011-01-07 LAB — COMPREHENSIVE METABOLIC PANEL
ALT: 13 U/L (ref 0–35)
AST: 16 U/L (ref 0–37)
Albumin: 3 g/dL — ABNORMAL LOW (ref 3.5–5.2)
Alkaline Phosphatase: 65 U/L (ref 39–117)
BUN: 5 mg/dL — ABNORMAL LOW (ref 6–23)
GFR calc Af Amer: >60 mL/min (ref >60)
Potassium: 3.3 mEq/L — ABNORMAL LOW (ref 3.5–5.1)
Sodium: 137 mEq/L (ref 135–145)
Total Protein: 5.9 g/dL — ABNORMAL LOW (ref 6.0–8.3)

## 2011-01-07 LAB — URINALYSIS, ROUTINE W REFLEX MICROSCOPIC
Bilirubin Urine: NEGATIVE
Glucose, UA: NEGATIVE mg/dL
Specific Gravity, Urine: 1.01 (ref 1.005–1.030)

## 2011-01-07 LAB — URINE MICROSCOPIC-ADD ON

## 2011-01-07 LAB — CBC
Platelets: 236 10*3/uL (ref 150–400)
RDW: 17 % — ABNORMAL HIGH (ref 11.5–15.5)
WBC: 8.1 10*3/uL (ref 4.0–10.5)

## 2011-01-18 LAB — DIFFERENTIAL
Basophils Absolute: 0.1 10*3/uL (ref 0.0–0.1)
Basophils Relative: 1 % (ref 0–1)
Eosinophils Absolute: 0.1 10*3/uL (ref 0.0–0.7)
Neutro Abs: 5.1 10*3/uL (ref 1.7–7.7)
Neutrophils Relative %: 60 % (ref 43–77)

## 2011-01-18 LAB — HEMOCCULT GUIAC POC 1CARD (OFFICE): Fecal Occult Bld: NEGATIVE

## 2011-01-18 LAB — BASIC METABOLIC PANEL
CO2: 28 mEq/L (ref 19–32)
Calcium: 9.2 mg/dL (ref 8.4–10.5)
Glucose, Bld: 99 mg/dL (ref 70–99)
Sodium: 143 mEq/L (ref 135–145)

## 2011-01-18 LAB — CBC
MCHC: 34.5 g/dL (ref 30.0–36.0)
MCV: 81.6 fL (ref 78.0–100.0)
Platelets: 284 10*3/uL (ref 150–400)
RBC: 4.51 MIL/uL (ref 3.87–5.11)
WBC: 8.5 10*3/uL (ref 4.0–10.5)

## 2011-01-18 LAB — URINALYSIS, ROUTINE W REFLEX MICROSCOPIC
Ketones, ur: NEGATIVE mg/dL
Nitrite: NEGATIVE
Protein, ur: NEGATIVE mg/dL
Urobilinogen, UA: 1 mg/dL (ref 0.0–1.0)

## 2011-01-18 LAB — PREGNANCY, URINE: Preg Test, Ur: NEGATIVE

## 2011-01-21 LAB — CBC
HCT: 26.6 % — ABNORMAL LOW (ref 36.0–46.0)
Hemoglobin: 11.1 g/dL — ABNORMAL LOW (ref 12.0–15.0)
Hemoglobin: 9.4 g/dL — ABNORMAL LOW (ref 12.0–15.0)
MCHC: 35.1 g/dL (ref 30.0–36.0)
MCHC: 35.5 g/dL (ref 30.0–36.0)
Platelets: 198 10*3/uL (ref 150–400)
RBC: 3.02 MIL/uL — ABNORMAL LOW (ref 3.87–5.11)
RDW: 15.1 % (ref 11.5–15.5)

## 2011-01-21 LAB — RH IMMUNE GLOB WKUP(>/=20WKS)(NOT WOMEN'S HOSP)

## 2011-01-21 LAB — RPR: RPR Ser Ql: NONREACTIVE

## 2011-01-23 LAB — URINALYSIS, ROUTINE W REFLEX MICROSCOPIC
Glucose, UA: NEGATIVE mg/dL
Hgb urine dipstick: NEGATIVE
Ketones, ur: NEGATIVE mg/dL
pH: 6 (ref 5.0–8.0)

## 2011-01-28 LAB — URINALYSIS, ROUTINE W REFLEX MICROSCOPIC
Bilirubin Urine: NEGATIVE
Leukocytes, UA: NEGATIVE
Nitrite: NEGATIVE
Nitrite: NEGATIVE
Specific Gravity, Urine: 1.02 (ref 1.005–1.030)
Specific Gravity, Urine: 1.025 (ref 1.005–1.030)
Urobilinogen, UA: 0.2 mg/dL (ref 0.0–1.0)
pH: 6.5 (ref 5.0–8.0)

## 2011-02-26 NOTE — Assessment & Plan Note (Signed)
[facility] HEALTHCARE                                 ON-CALL NOTE   YALONDA, SAMPLE                        MRN:          [id number]  DATE:04/01/2007                            DOB:          [birth year]    TIME OF CALL:  6:12 p.m.   PHONE NUMBER:  [phone].   CHIEF COMPLAINT:  On the beeper says severe pain.  She says medicine  refill.  She said she an appointment with Dr. Clent Ridges last Monday, he wrote  her a prescriptions but did not get all of them written.  She has been  calling ever since then and cannot get through to anyone at his office.  She needs her medicine for her migraines and ovarian cyst.  She says she  talked to triage nurse today, it was supposed to be called into the  pharmacy and it has not been called in yet.  She has 1 prescription for  Maxalt and 1 prescription for hydrocodone that she needs.  I told her  that I could not refill those overnight, but I would be happy to call  the office in the morning and check on it for her and she said she would  just call herself and check in the morning, and also check with the  pharmacy again.  She knows to go to the emergency room if her symptoms  get severe.     Marne A. Tower, MD  Electronically Signed    MAT/MedQ  DD: 04/01/2007  DT: 04/02/2007  Job #: [id number]   cc:   Jeannett Senior A. Clent Ridges, MD

## 2011-02-26 NOTE — Assessment & Plan Note (Signed)
[facility] HEALTHCARE                                 ON-CALL NOTE   [name], [name]                        MRN:          [id number]  DATE:08/13/2007                            DOB:          08-17-1976    TIME OF CALL:  2:35 a.m.   She sees Dr. Clent Ridges.   TELEPHONE NUMBER:  [phone].   Patient is calling thinking that she has extremely high blood pressure.  She has been under a lot of stress lately but felt bad earlier this  afternoon.  She had her blood pressure checked at a local pharmacy and  it was 140/90, she has no history of hypertension.  Now over the past  several hours she has developed worsening symptoms including shortness  of breath, headache and tingling in her hands and feet.  She is  extremely worried that something bad is about to happen.  I advised her  to have someone drive her to the emergency room now.     Tera Mater. Clent Ridges, MD  Electronically Signed    SAF/MedQ  DD: 08/13/2007  DT: 08/13/2007  Job #: [id number]

## 2011-02-26 NOTE — Assessment & Plan Note (Signed)
[facility] HEALTHCARE                                 ON-CALL NOTE   VERLON, CARCIONE                        MRN:          [id number]  DATE:07/17/2010                            DOB:          06/03/1976    Mrs. Dome called to state that she is having increasing abdominal  distention and abdominal pain.  She has had some nausea and vomiting.  She was seen earlier today in the office and was told that she would be  admitted if symptoms did not improve.  A call was received at 5:30 a.m..  I told the patient that my office will be calling her in approximately 3  hours to arrange for a direct hospital admission.     Barbette Hair. Arlyce Dice, MD,FACG     RDK/MedQ  DD: 07/17/2010  DT: 07/17/2010  Job #: [id number]

## 2011-02-26 NOTE — Letter (Signed)
August 14, 2007     RE:  KEATON, STIREWALT  MRN:  [id number]  /  DOB:  [birth year]   To whom it may concern:   This letter is concerning a patient of mine by the name of [name]  (date of birth 1975-11-16). I have served as this patient's primary care  physician since I met her on September 22, 2003. Some of the things I have  been treating her over the years include depression and anxiety. This  letter will serve to establish that I feel these problems are under very  good control currently, and that she is stable, and is doing well both  from her perspective and my perspective. She sees me on a regular basis  for scheduled follow up visits in the office, she takes her medications  as prescribed and is generally following my recommended treatment plan  for her.   If I may be of further assistance, please let me know.    Sincerely,      Tera Mater. Clent Ridges, MD  Electronically Signed    SAF/MedQ  DD: 08/14/2007  DT: 08/15/2007  Job #: [id number]

## 2011-02-26 NOTE — Assessment & Plan Note (Signed)
[facility] HEALTHCARE                                 ON-CALL NOTE   [name], [name]                          MRN:          [id number]  DATE:06/19/2007                            DOB:          May 10, 1976    TIME OF CALL:  6:38 p.m.   TELEPHONE NUMBER:  [phone].   OBJECTIVE:  The patient has problems with her medications.  She was seen  in the office today by Dr. Clent Ridges at [facility] and was given  prescriptions.  Went to [facility] to get them filled and they say they  can not get them filled because they are 5 days too soon.  She was given  prescriptions for Adderall, Ativan, hydrocodone and is at the [facility] in [place].  I called the [facility] pharmacy at [phone]  and the pharmacist was adamant about not filling them early as one of  them was a class II medication.  Patient is going through a divorce, has  no insurance.  Was using [facility] and switched to [facility], was very  uptight and I told her if [facility] will not fill them I am not sure what  she can do.   PRIMARY CARE PHYSICIAN:  Dr. Clent Ridges, home office is [place].     Arta Silence, MD  Electronically Signed    RNS/MedQ  DD: 06/20/2007  DT: 06/20/2007  Job #: [id number]

## 2011-02-27 ENCOUNTER — Emergency Department (HOSPITAL_COMMUNITY): Payer: Medicaid Other

## 2011-02-27 ENCOUNTER — Emergency Department (HOSPITAL_COMMUNITY)
Admission: EM | Admit: 2011-02-27 | Discharge: 2011-02-28 | Disposition: A | Discharge status: Home / Self Care | Payer: Medicaid Other | Attending: Emergency Medicine | Admitting: Emergency Medicine

## 2011-02-27 DIAGNOSIS — R109 Unspecified abdominal pain: Secondary | ICD-10-CM | POA: Insufficient documentation

## 2011-02-27 DIAGNOSIS — R197 Diarrhea, unspecified: Secondary | ICD-10-CM | POA: Insufficient documentation

## 2011-02-27 DIAGNOSIS — R51 Headache: Secondary | ICD-10-CM | POA: Insufficient documentation

## 2011-02-27 DIAGNOSIS — Z79899 Other long term (current) drug therapy: Secondary | ICD-10-CM | POA: Insufficient documentation

## 2011-02-27 DIAGNOSIS — R112 Nausea with vomiting, unspecified: Secondary | ICD-10-CM | POA: Insufficient documentation

## 2011-02-27 DIAGNOSIS — M79609 Pain in unspecified limb: Secondary | ICD-10-CM | POA: Insufficient documentation

## 2011-02-27 LAB — CBC
MCV: 81.3 fL (ref 78.0–100.0)
Platelets: 222 10*3/uL (ref 150–400)
RDW: 14.6 % (ref 11.5–15.5)
WBC: 12.6 10*3/uL — ABNORMAL HIGH (ref 4.0–10.5)

## 2011-02-27 LAB — DIFFERENTIAL
Basophils Absolute: 0 10*3/uL (ref 0.0–0.1)
Eosinophils Absolute: 0.2 10*3/uL (ref 0.0–0.7)
Eosinophils Relative: 1 % (ref 0–5)
Lymphs Abs: 3.9 10*3/uL (ref 0.7–4.0)

## 2011-02-27 LAB — URINALYSIS, ROUTINE W REFLEX MICROSCOPIC
Ketones, ur: NEGATIVE mg/dL
Nitrite: NEGATIVE
Protein, ur: NEGATIVE mg/dL

## 2011-02-27 LAB — POCT PREGNANCY, URINE: Preg Test, Ur: NEGATIVE

## 2011-02-28 ENCOUNTER — Emergency Department (HOSPITAL_COMMUNITY): Payer: Medicaid Other

## 2011-02-28 ENCOUNTER — Other Ambulatory Visit (HOSPITAL_COMMUNITY): Payer: Self-pay | Admitting: Emergency Medicine

## 2011-02-28 DIAGNOSIS — K760 Fatty (change of) liver, not elsewhere classified: Secondary | ICD-10-CM

## 2011-02-28 LAB — COMPREHENSIVE METABOLIC PANEL
ALT: 15 U/L (ref 0–35)
Calcium: 9.3 mg/dL (ref 8.4–10.5)
Creatinine, Ser: 0.61 mg/dL (ref 0.4–1.2)
Glucose, Bld: 84 mg/dL (ref 70–99)
Sodium: 137 mEq/L (ref 135–145)
Total Protein: 7 g/dL (ref 6.0–8.3)

## 2011-02-28 LAB — LIPASE, BLOOD: Lipase: 15 U/L (ref 11–59)

## 2011-03-01 NOTE — Op Note (Signed)
   [name], [name]                           ACCOUNT NO.:  [id number]   MEDICAL RECORD NO.:  [id number]                   PATIENT TYPE:  AMB   LOCATION:  ENDO                                 FACILITY:  [facility]   PHYSICIAN:  John C. Madilyn Fireman, M.D.                 DATE OF BIRTH:  Feb 25, 1976   DATE OF PROCEDURE:  05/24/2003  DATE OF DISCHARGE:                                 OPERATIVE REPORT   PROCEDURE:  Colonoscopy.   INDICATION FOR PROCEDURE:  Rectal bleeding, tenesmus, and alteration in  bowel habits.  The procedure is primarily to rule out inflammatory bowel  disease as well as careful inspection of the perianal area for fissures or  hemorrhoids.   DESCRIPTION OF PROCEDURE:  The patient was placed in the left lateral  decubitus position and placed on the pulse monitor with continuous low-flow  oxygen delivered by nasal cannula.  She was sedated with 100 mcg IV fentanyl  and 10 mg IV Versed.  The Olympus video colonoscope was inserted into the  rectum and advanced to the cecum, confirmed by transillumination at  McBurney's point and visualization of the ileocecal valve and appendiceal  orifice.  The prep was excellent.  The terminal ileum was intubated and  explored for several centimeters and appeared to be within normal limits.  The cecum, ascending, transverse, descending, and sigmoid colon all appeared  normal with no masses, polyps, diverticula, or other mucosal abnormalities.  The rectum was generally normal, but there were a few small punctate red  areas in the distal 5 cm with some mild granularity, possibly representing  very mild proctitis.  Retroflexed view of the anus did reveal some small but  somewhat irritated-appearing internal hemorrhoids.  During visual inspection  after the procedure she appeared to have a small anal fissure that was not  very deep and located at about the 12 o'clock position.  The scope was then  withdrawn and the patient returned to the recovery  room in stable condition.  She tolerated the procedure well, and there were no immediate complications.   IMPRESSION:  1. Questionable minimal distal proctitis.  2. Small possibly irritated internal hemorrhoids.  3. Possible small anal fissure.   PLAN:  Await biopsy results, and will probably treat with an agent such as  ProctoFoam and begin a fiber supplement.                                               John C. Madilyn Fireman, M.D.    JCH/MEDQ  D:  05/24/2003  T:  05/24/2003  Job:  [id number]   cc:   Marcene Duos, M.D.  [address] [place]  [place] [address]  Fax: [phone]

## 2011-03-01 NOTE — Op Note (Signed)
NAMEDINORA, HEMM                 ACCOUNT NO.:  [id number]   MEDICAL RECORD NO.:  [id number]          PATIENT TYPE:  AMB   LOCATION:  DAY                          FACILITY:  [facility]   PHYSICIAN:  Thomas A. Cornett, M.D.DATE OF BIRTH:  06-Nov-1975   DATE OF PROCEDURE:  02/28/2006  DATE OF DISCHARGE:                                 OPERATIVE REPORT   PREOPERATIVE DIAGNOSIS:  Grade 3 prolapsed internal hemorrhoids.   POSTOPERATIVE DIAGNOSIS:  Grade 3 prolapsed internal hemorrhoids.   PROCEDURE:  PPH procedure for prolapsed hemorrhoids.   SURGEON:  Maisie Fus A. Cornett, M.D.   ASSISTANT:  Ollen Gross. Carolynne Edouard, M.D.   ANESTHESIA:  General endotracheal anesthesia.   ESTIMATED BLOOD LOSS:  20 mL.   SPECIMEN:  Mucosal doughnut without muscle to pathology.   INDICATIONS FOR PROCEDURE:  The patient is a 35 year old female who has  failed medical management and sclerotherapy treatment for grade 3 internal  hemorrhoids.  She has significant prolapsed hemorrhoids, and they are  causing great pain and swelling.  I recommended PPH for her for treatment of  this after discussion of surgical options with her.  Risks of the procedure  were discussed, which include bleeding, infection, possible fistula  formation, as well as pain and the possibility of recurrent disease.  She  understood the above and agreed to proceed.   DESCRIPTION OF PROCEDURE:  The patient was brought to the operating suite,  initially placed supine.  She was intubated and general anesthesia was  begun.  She was then placed in a prone jackknife position and both buttocks  were taped apart.  The perineum was prepped and draped in a sterile fashion.  A rectal examination was done.  Tone was slightly decreased.  A proctoscope  was then placed, which showed significant grade 3 internal hemorrhoids.  At  this point time a pursestring suture of 0 Prolene was used and was placed 4  cm above the dentate line.  Once the pursestring suture was  done, the clear  anal retractor was then placed with the bullet.  The strings were pulled out  through the anal retractor and this was pushed all the way down so it was  flush with the beginning of the anal canal.  Only mucosal bites of the  mucosa were taken for the pursestring suture.  Next a stapling device was  brought on the field and the anvil was dropped through the pursestring  suture and did so quite nicely.  The pursestring was then tied down.  The  ends were pulled out through the [facility] stapling device and an air knot was  tied.  Then the device was then closed, advancing the device up toward where  the pursestring suture is.  Once this was done, this was a least 4-5 cm  above the dentate line, it looked like by our measurement.  I placed my  finger in the vagina and rocked the stapling device to and fro.  It did not  feel I had the vaginal wall within the staple line and only mucosa.  The  stapler  was then held for a minute and then fired.  After firing it was held  for a minute.  One turn was made and the stapling device was removed.  Inspection of the doughnut revealed no muscle and mucosa with hemorrhoidal  tissue.  The staple line was examined.  There ws some oozing in spots, and  these oozing using areas were oversewn with 3-0 Vicryl.  Hemostasis at this  point was excellent.  Gelfoam was then placed at this point in time.  The  vagina was  examined again and felt no injury to the vagina upon examination of this.  I  felt no hematoma.  The patient at this point in time was placed supine and  then extubated and taken to recovery in satisfactory condition.  All final  counts of sponge, needle and instruments were found to be correct this  portion of the case.      Thomas A. Cornett, M.D.  Electronically Signed     TAC/MEDQ  D:  02/27/2006  T:  02/28/2006  Job:  [id number]   cc:   Zelphia Cairo, MD  Fax: [phone]   Tera Mater. Clent Ridges, M.D. [facility]  [address] [place]  [place] [address]

## 2011-03-01 NOTE — Assessment & Plan Note (Signed)
[facility]                                 ON-CALL NOTE   [name], [name]                          MRN:          [id number]  DATE:01/12/2007                            DOB:          1975-11-24    Patient of Dr. Clent Ridges.   PHONE NUMBER:  [phone].   TIME OF CALL:  8:48 p.m. on March 31.   She calls complaining of severe pain and dehydration. [name] has been  having problems with a prolapsed uterus and prolapsed rectum for over a  year. She needs to have surgery but does not have any insurance. She is  crying at me, she is complaining to me, she is almost hysterical, this  was a horrible phone call. She is telling me that she did not want me to  be disturbed, she only wanted to be called if Dr. Clent Ridges was on call. I  told her we could not do that. At the same time, she is saying how sick  she is and dehydrated and that she needs to be kept in the hospital and  they will send her home again. Clearly this woman did not sound  dehydrated, she has had a chronic problem and there are obviously mood  issues going on as she was tearful and very annoyed with me because I  told her that it did not sound like she was going to be admitted to the  hospital again and was trying to realistically tell her whether she  wanted to do that or not. She said she cannot keep up with Dr. Clent Ridges the  way she is supposed to or can or would like to because she does not have  insurance. I told her that going to the emergency room will burn through  a lot more money than going to see Dr. Clent Ridges as an outpatient.   PLAN:  She hung up on me. I told her that if she was in pain and  dehydrated she should go to the emergency room but that it did not sound  like she needed to do that. In her defense, she did say that she did not  want to be called unless Dr. Clent Ridges was on but she was not a reasonable  patient on the phone, has high expectations and was not easy to talk to.  Dr. Clent Ridges really  needs to deal with her and try to figure out how to keep  these phone calls during regular hours because this was totally  inappropriate.     Karie Schwalbe, MD  Electronically Signed    RIL/MedQ  DD: 01/12/2007  DT: 01/13/2007  Job #: [id number]   cc:   Tera Mater. Clent Ridges, MD

## 2011-03-01 NOTE — Discharge Summary (Signed)
[name], [name]                 ACCOUNT NO.:  [id number]   MEDICAL RECORD NO.:  [id number]          PATIENT TYPE:  INP   LOCATION:  5704                         FACILITY:  [facility]   PHYSICIAN:  Valerie A. Felicity Coyer, MDDATE OF BIRTH:  December 05, 1975   DATE OF ADMISSION:  05/31/2006  DATE OF DISCHARGE:  06/03/2006                                 DISCHARGE SUMMARY   DISCHARGE DIAGNOSES:  1. Intractable abdominal/pelvic pain, multifactorial, no acute      abnormalities identified.  2. Nausea, vomiting, constipation, likely due to narcotic use, status post      treatment and resolution.  3. Migraine headache history.  4. History of attention deficit disorder.  5. Rectal prolapse followed by Dr. Mendel Corning at [facility].  6. History of grade III prolapsed hemorrhoids, status post hemorrhoid      surgery by Dr. Luisa Hart May 2007.  7. Rectocele and cystocele with prolapse, outpatient follow-up at [facility] as      previously scheduled.   CONSULTATION THIS HOSPITALIZATION:  [facility], Dr. Ewing Schlein.   PROCEDURES:  1. June 01, 2006, a CT abdomen and pelvis negative except for right      simple ovarian cyst.  2. June 02, 2006, pelvic and abdominal ultrasound negative except for      small simple right ovarian cyst.   DISCHARGE MEDICATIONS:  1. Vicodin 5/500 one to two p.o. q.6h. p.r.n. pain.  2. Percocet 5/325 one to two p.o. nightly p.r.n. severe pain.  3. Other medications are as prior to admission without change and include      Effexor XR 75 mg p.o. nightly.  4. Ortho Tri-Cyclen birth control daily.  5. Maxalt 10 mg p.o. p.r.n. migraine.  6. Xanax 0.5 mg p.o. nightly and p.r.n. anxiety.  7. Adderall 10 mg p.o. b.i.d. (Note, patient currently without insurance      and reports that she does not have money for any of these      prescriptions, but is working on assistance with social services at      time of dictation.   HOSPITAL FOLLOW-UP:  With primary care physician Dr. [name]  scheduled  for next week Tuesday June 10, 2006, at 11:00 a.m. to review these  followings as well as arrange for outpatient GYN follow-up should these be  needed.  Also, patient should remain with previously scheduled GI evaluation  currently ongoing at [facility], colonoscopy is scheduled for third week of  September.  No acute issues requiring further hospitalization or evaluation.   CONDITION ON DISCHARGE:  Medically stable, though complaining of pain.   HOSPITAL COURSE BY PROBLEM:  PROBLEM #1 - ABDOMINAL PAIN:  The patient is a  35 year old woman with ongoing evaluation of chronic abdominal and pelvic  pain associated with hemorrhoids and rectal/vaginal and bladder prolapse who  has continued to have pain unrelieved by over-the-counter Vicodin and  Percocet.  She reports that her doctors at [facility] have told her she needs a  colonoscopy but this has not been scheduled until the third week of  September.  Because of persisting pain, the patient came  to the emergency  room where he was referred for evaluation to rule out acute abnormality.  Laboratory data was unremarkable.  A CT of the abdomen and pelvis was  repeated which showed no acute abnormalities.  Likely because of a previous  history of abnormal ovarian cyst on July ultrasound, a repeat ultrasound was  performed on June 02, 2006, which showed no acute abnormalities.  The  patient wished to proceed with her colonoscopy sooner than she reported  initially denying that one was scheduled, saying they would not do one  because she had no insurance.  GI consult was obtained who revealed that  patient did in fact have a colonoscopy scheduled and recommended follow up  there as previously scheduled as there was no acute indication for  colonoscopy.  She was treated with MiraLax for constipation with good  results and at this time is felt stable for discharge having no acute issues  requiring hospitalization.      Valerie A. Felicity Coyer,  MD  Electronically Signed     VAL/MEDQ  D:  06/03/2006  T:  06/03/2006  Job:  [id number]

## 2011-03-01 NOTE — Assessment & Plan Note (Signed)
[facility]                                   ON-CALL NOTE   [name], [name]                          MRN:          [id number]  DATE:08/24/2006                            DOB:          [birth year]    The patient complains of losing her voice over a day or two and some fever  up to 103 today.  She takes Tylenol but it goes back up.  She denies any  shortness of breath, although has some chest discomfort.  She wonders what  she can take for comfort.  She states she is not coughing at present but  feels like she needs to.  Recommended warm liquids, ibuprofen, emergency  room evaluation if she feels she has shortness of breath or getting worse,  otherwise see Dr. Clent Ridges in the office tomorrow.    ______________________________  Neta Mends. Fabian Sharp, MD    WKP/MedQ  DD: 08/24/2006  DT: 08/24/2006  Job #: [id number]

## 2011-03-01 NOTE — H&P (Signed)
[name], [name]                 ACCOUNT NO.:  [id number]   MEDICAL RECORD NO.:  [id number]          PATIENT TYPE:  INP   LOCATION:  5729                         FACILITY:  [facility]   PHYSICIAN:  Gordy Savers, MDDATE OF BIRTH:  1976-09-23   DATE OF ADMISSION:  05/31/2006  DATE OF DISCHARGE:                                HISTORY & PHYSICAL   CHIEF COMPLAINT:  Abdominal pain and constipation.   HISTORY OF PRESENT ILLNESS:  The patient is a 35 year old white female who  states that she has had altered bowel habits since surgery for stage III  prolapsed hemorrhoids in May 2007. She states that she has had loose watery  daily bowel movements until 8 days ago. Since that time, she states she has  had no further bowel movements.  She states that she is intolerant of oral  intake due to nausea and worsening pain. There has been no active emesis.  She also states that she is followed at [facility] for apparently a  rectocele or rectal prolapse. She states that she has seen Dr. Loreta Ave locally  but has been discharged from her practice. Review of her hospital records  also reveals that she had colonoscopy by Dr. Madilyn Fireman in August 2004.  Evaluation included laboratory screen as well as a CT abdominal scan there  were all normal or unremarkable.  The patient is very reluctant for home  discharge due to persistent pain and inability to tolerate p.o. intake.  She  is now admitted for further evaluation treatment for abdominal pain, nausea  and constipation.   PAST MEDICAL HISTORY:  1. As mentioned, she has a history of right rectal prolapse and followed      at [facility].  2. She had surgery for grade 3 prolapsed hemorrhoids by Dr. Luisa Hart in May      2007.  3. Colonoscopy was performed in August 2004.  4. She is a gravida 2, para 2, abortus 0. Dr. Ambrose Mantle delivered her last      child.  5. She has a history of pyelonephritis and cystitis.   ALLERGIES:  Include  PENICILLIN and DARVOCET-N 100.   PRESENT MEDICAL REGIMEN:  Includes Effexor, iron supplements, Vicodin,  Percocet, Xanax, and Maxalt.   SOCIAL HISTORY:  She has two children, a one-half-pack-per-day smoker.  She  is accompanied by both parents.   FAMILY HISTORY:  Is positive for hypertension, coronary disease.  A sister  has bipolar disorder.   PHYSICAL EXAMINATION:  GENERAL:  Revealed a well-developed, healthy-  appearing, well-nourished young lady in no acute distress.  VITAL SIGNS:  Were stable.  Temperature was normal.  SKIN:  Unremarkable.  HEAD AND NECK: Revealed normal pupillary responses.  Conjunctivae anicteric.  Ear, nose and throat clear.  Mucous membranes appeared well hydrated.  Neck:  No adenopathy or thyroid enlargement.  CHEST: Was clear.  CARDIOVASCULAR: Exam revealed a regular rhythm without tachycardia.  ABDOMEN: Was somewhat firm, perhaps minimally distended.  There is no  tenderness.  Bowel sounds were active.  EXTREMITIES: No edema.   IMPRESSION:  1. Abdominal  pain and constipation, probable irritable bowel syndrome.  2. History of rectal prolapse   DISPOSITION:  1. The patient will be admitted to hospitalist with IV fluids and treated      symptomatically.  2. She will undergo a bowel prep to see if this does not relieve her      abdominal distension and discomfort as well as the constipation.           ______________________________  Gordy Savers, MD     PFK/MEDQ  D:  06/01/2006  T:  06/01/2006  Job:  [id number]

## 2011-03-01 NOTE — Assessment & Plan Note (Signed)
[facility]                                   ON-CALL NOTE   [name], [name]                        MRN:          [id number]  DATE:07/11/2006                            DOB:          03-23-1976    CHIEF COMPLAINT:  Congestion, hard time breathing.   I am Dr. Milinda Antis, on-call.  Dr. Clent Ridges is her regular doctor.  Patient states  that she called Dr. Clent Ridges two Tuesdays ago for congestion.  She was diagnosed  with sinusitis and put on amoxicillin, and put on a cough syrup.  She called  back two days later.  She said that she still was coughing a lot.  He called  in another cough syrup, but it was just the name brand of the same cough  syrup he had given her before.  It did not help anymore.  She said she had  been calling the office all day today and she has not had any calls back.  She said she cannot breathe.  She has used her son's nebulizer treatment  several times.  She is having a very difficult time breathing with severe  chest congestion and very bad cough.  She wants to know what to do.  I told  her if she is having difficulty breathing she needs to go to the emergency  room or an urgent care now for evaluation and possibly a chest x-ray, and  this is what I advised her to do.            ______________________________  Audrie Gallus. Tower, MD      MAT/MedQ  DD:  07/11/2006  DT:  07/14/2006  Job #:  [id number]   cc:   Jeannett Senior A. Clent Ridges, MD  Audrie Gallus. Milinda Antis, MD

## 2011-03-01 NOTE — Consult Note (Signed)
NAMEPRECIOUS, [name]                 ACCOUNT NO.:  [id number]   MEDICAL RECORD NO.:  [id number]          Tanner TYPE:  INP   LOCATION:  5704                         FACILITY:  [facility]   PHYSICIAN:  Stephani Police, PA    DATE OF BIRTH:  [birth year]   DATE OF CONSULTATION:  06/02/2006  DATE OF DISCHARGE:  06/03/2006                                   CONSULTATION   REASON FOR Danielle CONSULT:  GI pain.   CHIEF COMPLAINT:  We were asked by [facility] PA Sarah, to consult on  [name] for extended abdominal pain.   HPI:  This is a 35 year old Caucasian female who describes a sharp pain that  started in April in her left upper quadrant and her mid hypogastric area.  She states that she had hemorrhoid surgery performed by Dr. Luisa Hart in May,  after which she has had constant diarrhea and vaginal bleeding until August  Danielle 11th.  Since August Danielle 11th, she has had no bowel movement.  Says that  she just passes mucus, and Danielle vaginal bleeding has subsided.  She reports  that she has lost 21 pounds since her surgery and that she takes Vicodin and  Percocet at home for Danielle pain.  She also reports that she has a prolapsed  rectum as well as a rectocele and cystocele.  She says that each time that  she uses Danielle restroom, she has to push her rectum back into place.   PAST MEDICAL HISTORY:  1. Significant for right rectal prolapse which is followed by Dr. Tania Ade at [facility] as well as rectocele, cystocele.  2. Grade III prolapsed hemorrhoids which were surgically corrected in May      2007.  3. History of pyelonephritis and cystitis.  4. History of depression and anxiety.   ALLERGIES:  PENICILLIN AND DARVOCET-N 100.   MEDICATIONS:  Per Danielle August 18th H&P.   FAMILY HISTORY:  There is no colon cancer or history of pelvic ulcer disease  in her family.   SOCIAL HISTORY:  She is married.  Smokes 1 pack per day.  She has 2  children.   PHYSICAL EXAM:  GENERAL:  She is sitting up,  in no apparent distress, and  she is alert and oriented.  Her father, this afternoon, had a stroke at her  bedside, so she has been down in Danielle ER room caring for her father.  CARDIOVASCULAR:  She has regular rate and rhythm with no murmurs, rubs or  bruits appreciated.  LUNGS:  Clear to auscultation.  ABDOMEN:  Has positive bowel sounds.  It is distended, but it is not firm.  It is soft.  It is tender to palpation in Danielle left upper quadrant as well as  Danielle mid hypogastric area.  There are no bruits.  There is no organomegaly.  There is no edema.  VITAL SIGNS:  Her vital signs are stable.   LABORATORY DATA:  Her labs show a white count of 6.8, hemoglobin of 12.7,  hematocrit 36.9, and platelet count of  164,000.  CHEM-7 showed sodium 139,  potassium 3.4, chloride 105, bicarb 27, BUN 6 and creatinine 0.7, glucose  96.  On August Danielle 19th, she had a negative abdominal CT, save for Danielle fact  of a cyst on her right ovary.  On August Danielle 19th, she also had a negative  abdominal film.  On August Danielle 20th, she had a negative ultrasound that just  showed a cyst on her right ovary.   ASSESSMENT/PLAN:  Her radiologic exams looked good.  Her physical exam  looked good other than Danielle mild left upper quadrant pain and mid hypogastric  pain.  Her labs were within normal limits, so assessment and plan.  Discussed this with Dr. Ewing Schlein, prescribed MiraLax 17 grams b.i.d. in water,  to help her pass stool.  I discussed with [name] going back to [facility] to have them evaluate her cystocele and rectocele for  corrections.  She already has a colonoscopy scheduled with Dr. Tania Ade at [facility] during Danielle 3rd week of September.  I discussed this case  with Dr. Ewing Schlein.  He saw and examined [name], and he prefers for her to  return to [facility] where her primary GI team has already scheduled a colonoscopy  for her, as her radiologic exams are negative, and her labs are negative.  He  feels that she could be handled as an outpatient and asked that if  possible, we could call Dr. Tania Ade to ask him to move Danielle  colonoscopy up for her.      Stephani Police, PA     MLY/MEDQ  D:  06/03/2006  T:  06/03/2006  Job:  [id number]   cc:   Petra Kuba, M.D.  Tera Mater. Clent Ridges, MD  Tania Ade, MD

## 2011-03-01 NOTE — Assessment & Plan Note (Signed)
[facility]                                   ON-CALL NOTE   EGYPT, WELCOME                        MRN:          [id number]  DATE:06/01/2006                            DOB:          [birth year]    Patient of Dr. Clent Ridges.   The patient give a long convoluted story about the fact that she has had  abdominal pain.  She said it is some type of intestinal obstruction.  She  saw Dr. Clent Ridges.  Our GI people could not figure out what it was.  They sent her  to [facility].  She went to [facility] but she could not get a CT scan of her abdomen  because she did not have insurance, that was a month ago.  Now she has not  had a bowel movement in eight days and she is in extreme pain.  I advised  her she needs to be admitted for eval.  She can either go over to the folks  at [facility] that she has seen previously or go to [facility].  The patient said the  doctors at [facility] told her she needed to be admitted and Dr. Clent Ridges would need to  do it at [facility].  They would not be able to do it at [facility] interestingly  enough.                                   Jeffrey A. Tawanna Cooler, MD   JAT/MedQ  DD:  06/01/2006  DT:  06/01/2006  Job #:  [id number]   cc:   Jeannett Senior A. Clent Ridges, MD

## 2011-03-01 NOTE — Discharge Summary (Signed)
[facility]  Patient:    [name], [name]                        MRN: [id number] Adm. Date:  [id number] Disc. Date: [id number] Attending:  Malon Kindle                           Discharge Summary  HISTORY:                      This is a 35 year old white married female, para 1-0-0-1, gravida 2, last period questionably August 08, 2000, with [facility] May 28, 2001 by ultrasound who was admitted in active labor.  PRENATAL LABORATORY DATA:     Blood group and type O negative with a negative antibody, nonreactive serology, rubella immune, hepatitis B surface antigen negative, HIV negative, GC and Chlamydia negative, triple screen normal, group B strep not done, one-hour glucola 135. Vaginal ultrasound on October 22, 2000: Crown-rump length of 2.1 cm, 8 weeks 5 days, [facility] May 28, 2001. Repeat ultrasound on December 26, 2000: Average gestational age [redacted] weeks 0 days, [facility] May 29, 2001.  The patient was treated with iron sulfate for anemia. She had five prior visits to Maternity Admission for false labor. On July 21 at 1:30 p.m. she began contracting and she was seen in Maternity Admission. The cervix was 4 to 5 cm.  PAST MEDICAL HISTORY:  ALLERGIES:                    PENICILLIN and DARVOCET.  OPERATIONS:                   None.  ILLNESSES:                    Usual childhood diseases, cystitis, and pyelonephritis.  ALCOHOL, TOBACCO, AND DRUGS:  Smokes 1/2 pack of cigarettes per day.  FAMILY HISTORY:               Mother with heart disease and high blood pressure. Father with heart disease. Sister with bipolar disorder.  PHYSICAL EXAMINATION ON ADMISSION:                 Exam on admission revealed normal vital signs. The abdomen was soft. The fundal height had been 36 cm on April 28, 2001. Fetal heart tones were normal. Contractions were every two to three minutes. The cervix was 4 to 5 cm, 80% to 90%, vertex at a -1. Artificial rupture of  the membranes produced clear fluid.  IMPRESSIONS ON ADMISSION:     1. Intrauterine pregnancy, 36+ weeks.                               2. Active labor.                               3. Unknown group B streptococcus status.  HOSPITAL COURSE:              The patient requested an epidural. She was treated with clindamycin for the unknown group B strep status. By 9:20 p.m. the cervix was 6 to 7 cm, contractions every two to four minutes, and Pitocin was added for augmentation. She reached full dilatation and delivered spontaneously ROA over  an intact perineum by Dr. Ambrose Mantle with a left vaginal and perineal laceration and bilateral labial lacerations, a living female infant, 6 pounds 8 ounces, Apgars of 8 at one and 8 at five minutes. Dr. Mikle Bosworth was called to the delivery room at approximately two minutes of age because the baby was pulling with respirations. She elected to take the baby to the NICU. The placenta was intact. The uterus was normal, rectum negative. Lacerations were repaired with 3-0 Dexon under local block. The bladder was catheterized for 750 cc. Blood loss was about 500 cc. The baby apparently had a spontaneous pneumothorax.  Postpartum the patient did quite well. On the second postpartum day she was doing well with a normal blood pressure. Condition was improved. Instructions were given and she was advised to return in six weeks for followup examination. The baby was improving and it was felt that the baby would probably stay for seven days.  LABORATORIES:                 Hemoglobin on admission 9.9, hematocrit 28.4, white count 14,000, platelet count 233,000. Followup hemoglobin 9.0, RPR was nonreactive. The Rh immune globulin workup was cancelled because the baby was Rh negative. The mother was not a candidate for Rh immune globulin.  FINAL DIAGNOSIS:              Intrauterine pregnancy, 36+ weeks, delivered                               vertex.  OPERATIONS:                    Spontaneous delivery, vertex, with repair                               of vaginal and perineal lacerations.  FINAL CONDITION:              Improved.  DISCHARGE INSTRUCTIONS:       Instructions include our regular discharge instruction booklet.  DISCHARGE FOLLOWUP:           The patient was advised to return in six weeks for followup examination.  DISCHARGE MEDICATIONS:        Percocet 5/325, 16 tablets, one every four to six hours was given at discharge. DD:  05/27/01 TD:  05/27/01 Job: [id number]

## 2011-03-01 NOTE — Discharge Summary (Signed)
[name], [name]                 ACCOUNT NO.:  [id number]   MEDICAL RECORD NO.:  [id number]          PATIENT TYPE:  IPS   LOCATION:  0305                          FACILITY:  [facility]   PHYSICIAN:  Anselm Jungling, MD  DATE OF BIRTH:  [birth year]   DATE OF ADMISSION:  08/14/2006  DATE OF DISCHARGE:  08/18/2006                                 DISCHARGE SUMMARY   IDENTIFYING DATA/REASON FOR ADMISSION:  The patient is a 35 year old  separated Caucasian female, unemployed, who took an overdose of Vicodin in a  suicide attempt.  The immediate precipitant for this had been her  approaching her husband, from whom she was separated, asking to reconcile,  and his refusal.  She had recently lost her job.  She felt she had nowhere  to go.  She described few available supports.  On an outpatient basis, she  had been treated with Effexor, Adderall, and Xanax.  She also came to [place]  with a history of migraine headache.  Please refer to the admission note for  further details pertaining to the symptoms, circumstances and history that  led to her hospitalization.   INITIAL DIAGNOSTIC IMPRESSION:  She was given initial AXIS I diagnoses of  mood disorder not otherwise specified, and rule out adjustment disorder with  depressed mood.   MEDICAL/LABORATORY:  The patient was treated for her overdose in the medical  setting.  Following this, she was transferred to the psychiatric hospital  where she was admitted and medically and physically reassessed by the  psychiatric nurse practitioner.  Aside from migraine headache, she was in  good health and there were no acute medical issues.  She did have severe  headache during her stay and was treated with p.r.n. Phenergan and Dilaudid  for this with reasonably good results.   HOSPITAL COURSE:  The patient was admitted to the adult inpatient  psychiatric service.  She presented as a well-nourished, well-developed  female who initially was quite withdrawn and made  little effort to interact.  However, she did not appear to have any sign of cognitive dysfunction,  psychosis, or thought disorder.  She was fully oriented.  She was somewhat  irritable as she was experiencing a migraine headache.  She indicated that  she did want help from [place], and was agreeable to staying for treatment.   She participated in various therapeutic groups and activities.  She was  continued on Effexor XR 37.5 mg daily which she had been taking prior to  admission.  She was not given Adderall and Xanax, which she had also been  taking, as these did not appear to be essential to her stabilization.   On the third hospital day and fourth hospital day, the patient was seen by  Dr. Katrinka Blazing.  The patient continued to complain of migraine headache, and  appeared fragile and tearful, and reported poor sleep.  However, on the  fourth hospital day, she did request discharge.  She was not felt to be  ready even though she was indicating that she was not feeling suicidal.   On the  following day, the undersigned interviewed the patient and she  appeared to be quite stable, in a much better mood, with relaxed, bright and  smiling affect.  She appeared comfortable with the discharge and aftercare  plans we had developed for her.  She had been absent suicidal ideation for  several days.   AFTERCARE:  The patient was to follow up with Dr. Lang Snow at the [facility] with an appointment on August 21, 2006.  She was also referred to  [facility] the [place].   DISCHARGE MEDICATIONS:  1. Effexor XR 37.5 mg daily for three days, then discontinue.  2. Risperdal M-Tab 0.5 mg q.h.s.  3. Lamictal 25 mg daily.   At the time of discharge, it was anticipated that Effexor would be  discontinued after three days, and her outpatient psychiatrist could  reevaluate her for the appropriateness of another antidepressant.  In the  meantime, Lamictal and Risperdal had been started to address  bipolar  depression.   DISCHARGE DIAGNOSES:  AXIS I:  Bipolar disorder, most recently depressed  without psychotic features.  Adjustment disorder with depressed mood.  AXIS II:  Deferred.  AXIS III:  No acute or chronic illnesses, history of migraine headache.  AXIS IV:  Stressors:  Severe.  AXIS V:  GAF on discharge 65.      Anselm Jungling, MD  Electronically Signed     SPB/MEDQ  D:  08/20/2006  T:  08/20/2006  Job:  [id number]

## 2011-07-10 LAB — COMPREHENSIVE METABOLIC PANEL
Albumin: 3.4 — ABNORMAL LOW
Alkaline Phosphatase: 73
BUN: 6
CO2: 27
Chloride: 107
GFR calc non Af Amer: >60
Glucose, Bld: 108 — ABNORMAL HIGH
Potassium: 3.6
Total Bilirubin: 0.5

## 2011-07-10 LAB — CBC
HCT: 34.7 — ABNORMAL LOW
Hemoglobin: 12
MCV: 84.6
Platelets: 225
RBC: 4.1
WBC: 7.6

## 2011-07-10 LAB — URINALYSIS, ROUTINE W REFLEX MICROSCOPIC
Nitrite: NEGATIVE
Protein, ur: NEGATIVE
Specific Gravity, Urine: 1.022
Urobilinogen, UA: 0.2

## 2011-07-10 LAB — DIFFERENTIAL
Basophils Absolute: 0
Basophils Relative: 0
Eosinophils Relative: 3
Monocytes Absolute: 0.5
Neutro Abs: 3.7

## 2011-07-10 LAB — URINE MICROSCOPIC-ADD ON

## 2011-07-10 LAB — POCT PREGNANCY, URINE: Preg Test, Ur: NEGATIVE

## 2011-07-15 LAB — URINALYSIS, ROUTINE W REFLEX MICROSCOPIC
Bilirubin Urine: NEGATIVE
Ketones, ur: 15 — AB
Nitrite: NEGATIVE
Specific Gravity, Urine: 1.025
Urobilinogen, UA: 0.2

## 2011-07-15 LAB — POCT PREGNANCY, URINE: Preg Test, Ur: NEGATIVE

## 2011-07-15 LAB — WET PREP, GENITAL
Clue Cells Wet Prep HPF POC: NONE SEEN
Yeast Wet Prep HPF POC: NONE SEEN

## 2011-07-16 LAB — WET PREP, GENITAL
Trich, Wet Prep: NONE SEEN
Yeast Wet Prep HPF POC: NONE SEEN

## 2011-07-16 LAB — CBC
MCV: 86 fL (ref 78.0–100.0)
Platelets: 235 10*3/uL (ref 150–400)
WBC: 12.4 10*3/uL — ABNORMAL HIGH (ref 4.0–10.5)

## 2011-07-16 LAB — URINALYSIS, ROUTINE W REFLEX MICROSCOPIC
Glucose, UA: NEGATIVE mg/dL
Hgb urine dipstick: NEGATIVE
Ketones, ur: NEGATIVE mg/dL
Protein, ur: NEGATIVE mg/dL

## 2011-07-16 LAB — ABO/RH: ABO/RH(D): O NEG

## 2012-05-11 IMAGING — CT CT ABD-PELV W/ CM
2 of 5 series · 16 of 46 positions shown, 18 images · IV contrast (Omnipaque 300)
Comparison: 12/25/2009

CLINICAL DATA: Right-sided abdominal pain.  Hysterectomy 2 weeks
ago.

CT ABDOMEN AND PELVIS WITH CONTRAST
TECHNIQUE: Multidetector CT imaging of the abdomen and pelvis was
performed following the standard protocol during bolus
administration of intravenous contrast.
Contrast: 100  ml Tmnipaque-XYY

[Series 2: abd_pel_with 5.0 b40f · axial · 0.83mm/px · z∈[-496,-80]mm · 13 of 97 slices shown, 15 images]
[im 7/97  soft-tissue]
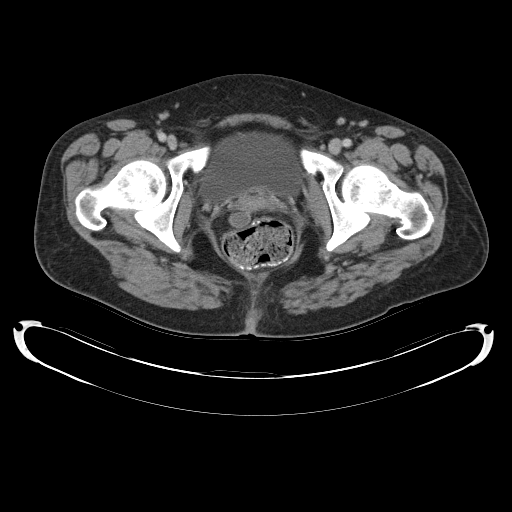
[im 7/97  bone]
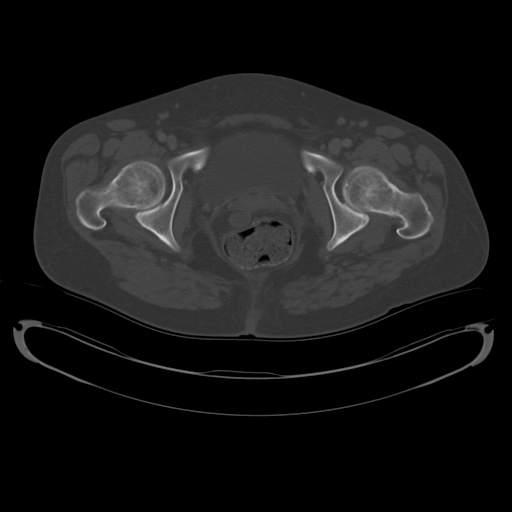
[im 14/97  soft-tissue]
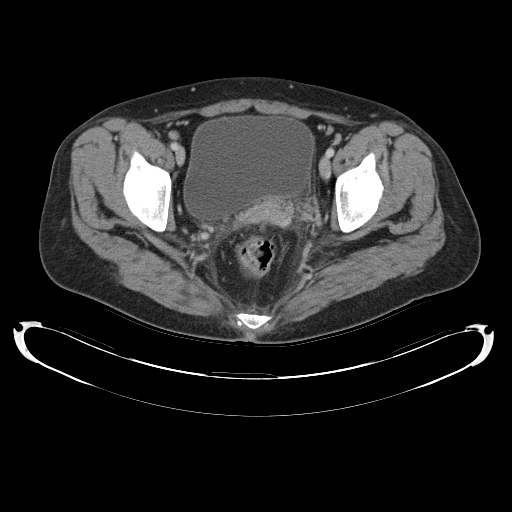
[im 21/97  soft-tissue]
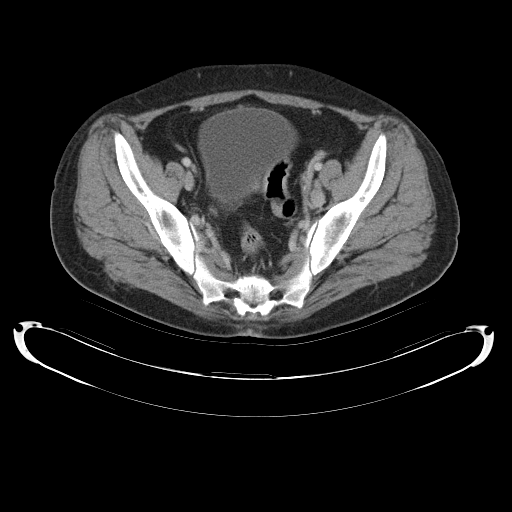
[im 28/97  soft-tissue]
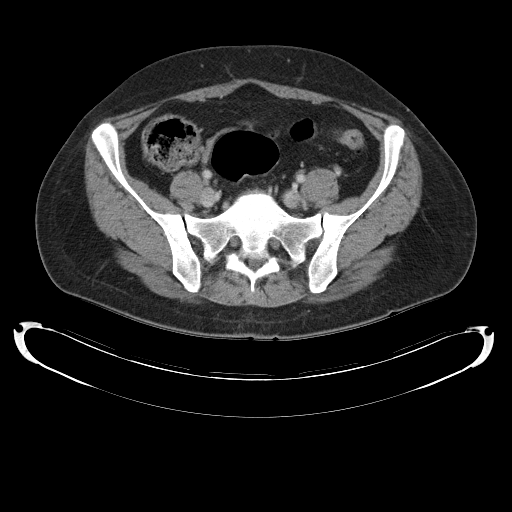
[im 35/97  soft-tissue]
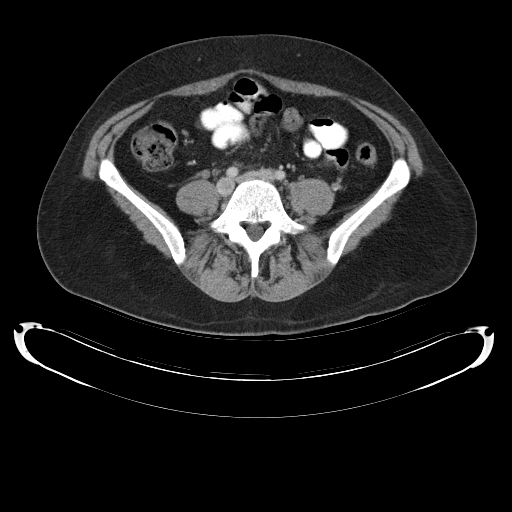
[im 42/97  soft-tissue]
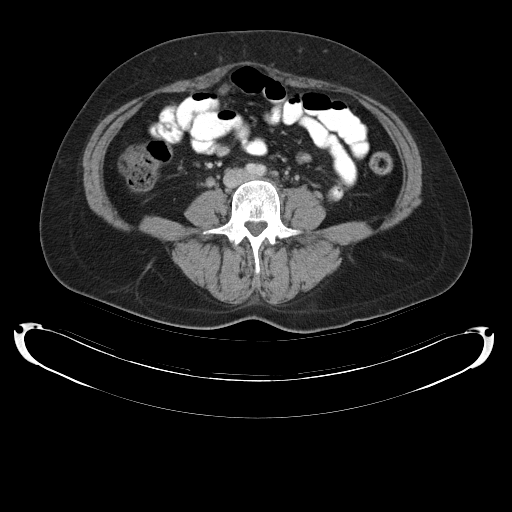
[im 49/97  soft-tissue]
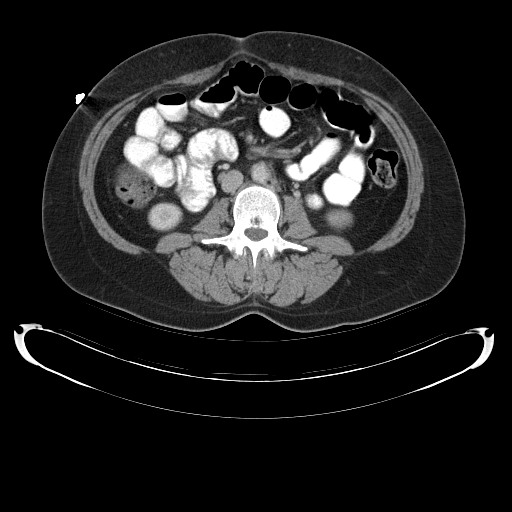
[im 55/97  soft-tissue]
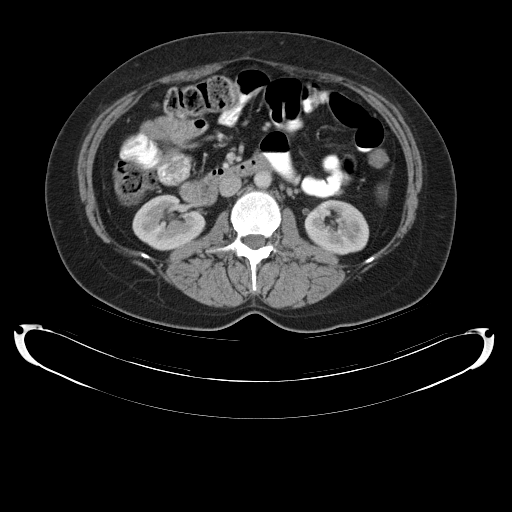
[im 62/97  soft-tissue]
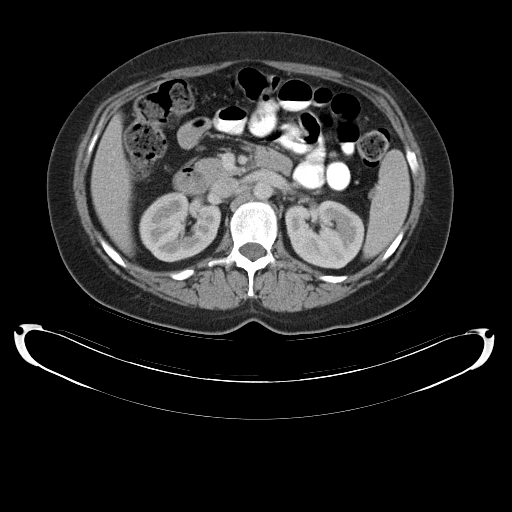
[im 62/97  bone]
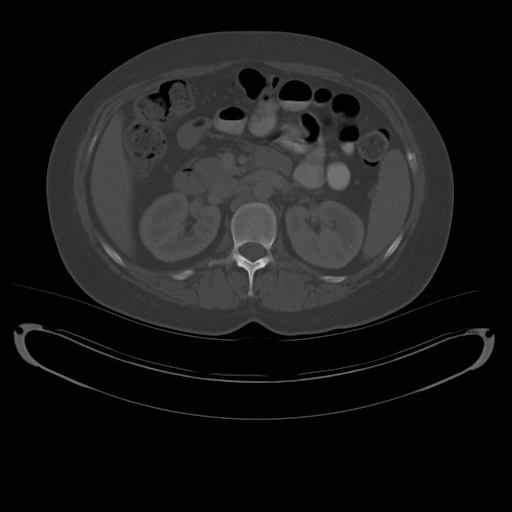
[im 69/97  soft-tissue]
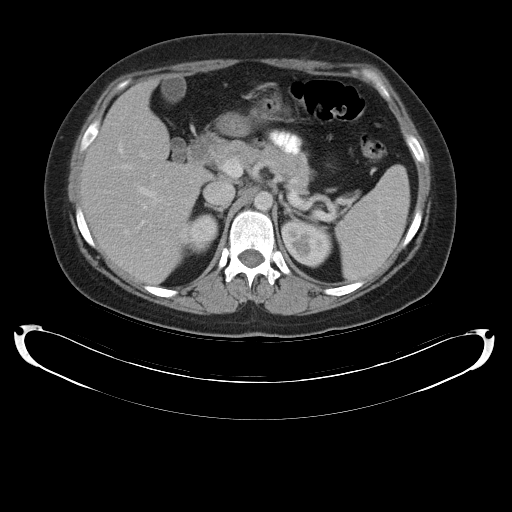
[im 76/97  soft-tissue]
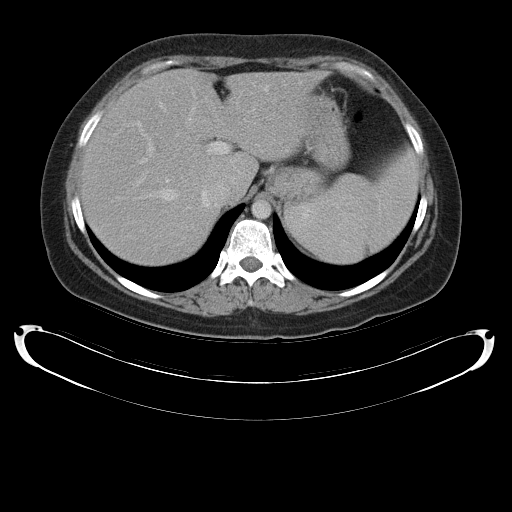
[im 83/97  soft-tissue]
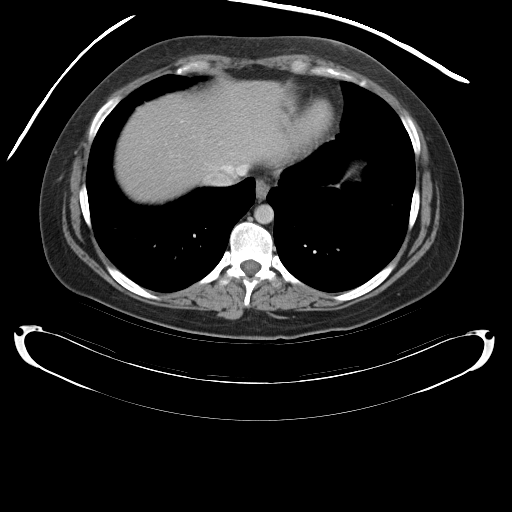
[im 90/97  soft-tissue]
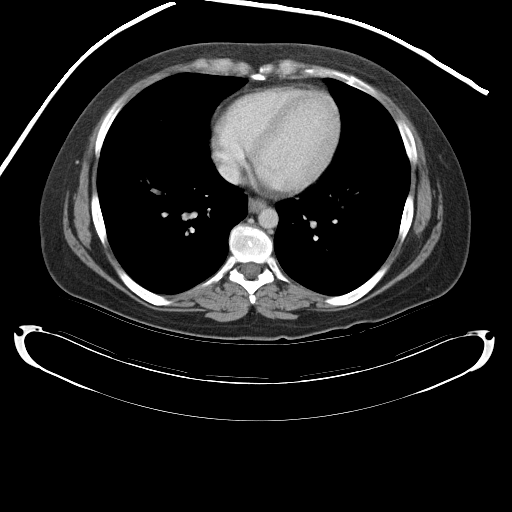

[Series 4: mpr cor post contrast (id) · coronal · 0.70mm/px · 3 of 78 slices shown]
[im 26/78  soft-tissue]
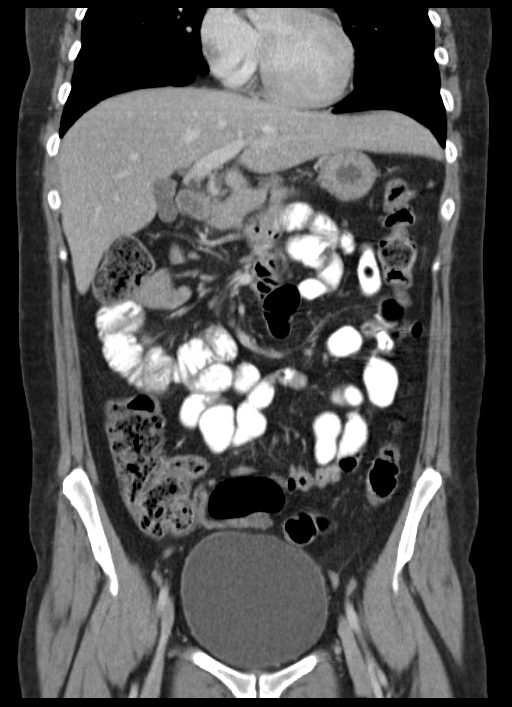
[im 35/78  soft-tissue]
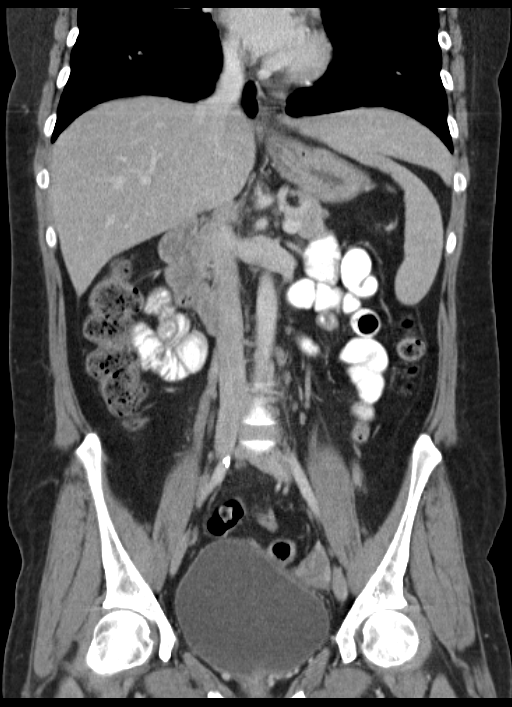
[im 43/78  soft-tissue]
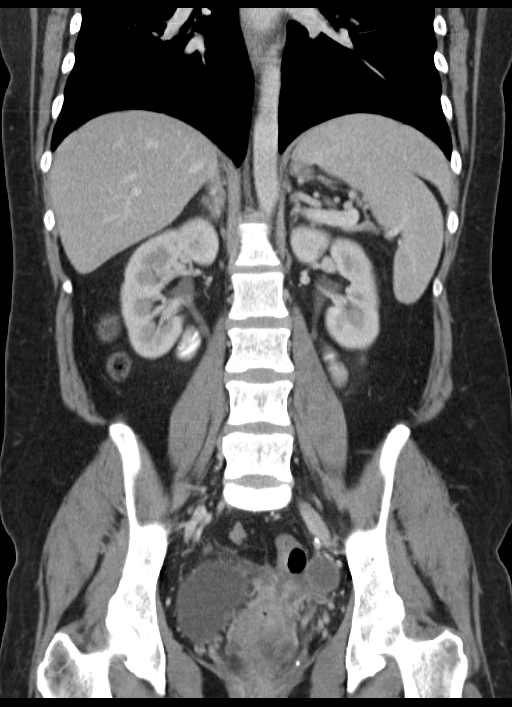

[16 of 46 positions shown; findings below may reference images not displayed]

FINDINGS: Clear lung bases.  Normal heart size without pericardial
or pleural effusion.  Normal liver, spleen, stomach, pancreas,
gallbladder, biliary tract, adrenal glands, kidneys.  Relatively
subtle edema at the root of the inferior mesenteric artery is
decreased or resolved.  Small adjacent lymph nodes are again
identified. No retroperitoneal or retrocrural adenopathy.

A row of surgical sutures identified within the rectum.  Appendix
is  identified on image 68.  No evidence of right lower quadrant
inflammation.  Normal terminal ileum.

Normal small bowel without ascites.

  Early but age advanced common iliac atherosclerosis.  Example
image 61. No pelvic adenopathy.    Normal urinary bladder.
Interval hysterectomy.  No adnexal mass.  There is pelvic cul-de-
sac edema and trace fluid.  Likely postoperative.  Posterior to the
right side of the vaginal cuff is a 1.7 x 1.3 x 2.5 cm fluid
collection.  Image 91 transverse and image 46 coronal.  Minimal
peripheral enhancement.  No gas within.

No acute osseous abnormality. A disc bulge at L5-S1 is incidentally
noted
IMPRESSION: 1.  No evidence of appendicitis.
2.  Interval hysterectomy.  A small fluid collection posterior to
the right side of the vaginal cuff is favored to represent a
hematoma.  No specific evidence to suggest infection.
3.  Further improvement to resolution in inflammation at the root
of the inferior mesenteric artery.

## 2013-02-23 ENCOUNTER — Emergency Department: Payer: Self-pay | Admitting: Emergency Medicine

## 2013-02-23 LAB — COMPREHENSIVE METABOLIC PANEL
Alkaline Phosphatase: 98 U/L (ref 50–136)
Anion Gap: 7 (ref 7–16)
BUN: 5 mg/dL — ABNORMAL LOW (ref 7–18)
Calcium, Total: 9 mg/dL (ref 8.5–10.1)
EGFR (Non-African Amer.): >60
SGOT(AST): 30 U/L (ref 15–37)
SGPT (ALT): 14 U/L (ref 12–78)
Total Protein: 7.4 g/dL (ref 6.4–8.2)

## 2013-02-23 LAB — ETHANOL: Ethanol %: <0.003 % (ref 0.000–0.080)

## 2013-02-23 LAB — DRUG SCREEN, URINE
Amphetamines, Ur Screen: NEGATIVE (ref ?–1000)
Barbiturates, Ur Screen: NEGATIVE (ref ?–200)
Cocaine Metabolite,Ur ~~LOC~~: NEGATIVE (ref ?–300)
MDMA (Ecstasy)Ur Screen: NEGATIVE (ref ?–500)
Methadone, Ur Screen: POSITIVE (ref ?–300)
Opiate, Ur Screen: POSITIVE (ref ?–300)
Phencyclidine (PCP) Ur S: NEGATIVE (ref ?–25)

## 2013-02-23 LAB — CBC
HCT: 43.8 % (ref 35.0–47.0)
MCH: 28 pg (ref 26.0–34.0)
MCV: 82 fL (ref 80–100)

## 2013-02-23 LAB — URINALYSIS, COMPLETE
Blood: NEGATIVE
Glucose,UR: NEGATIVE mg/dL (ref 0–75)
Ketone: NEGATIVE

## 2013-03-22 ENCOUNTER — Emergency Department: Payer: Self-pay | Admitting: Emergency Medicine

## 2013-03-22 LAB — COMPREHENSIVE METABOLIC PANEL
Albumin: 3.5 g/dL (ref 3.4–5.0)
Alkaline Phosphatase: 83 U/L (ref 50–136)
Anion Gap: 7 (ref 7–16)
BUN: 4 mg/dL — ABNORMAL LOW (ref 7–18)
Bilirubin,Total: 0.5 mg/dL (ref 0.2–1.0)
Calcium, Total: 8.9 mg/dL (ref 8.5–10.1)
Chloride: 102 mmol/L (ref 98–107)
Co2: 28 mmol/L (ref 21–32)
EGFR (African American): >60
Glucose: 86 mg/dL (ref 65–99)
Potassium: 3.3 mmol/L — ABNORMAL LOW (ref 3.5–5.1)
SGPT (ALT): 18 U/L (ref 12–78)
Sodium: 137 mmol/L (ref 136–145)

## 2013-03-22 LAB — URINALYSIS, COMPLETE
Bilirubin,UR: NEGATIVE
Glucose,UR: NEGATIVE mg/dL (ref 0–75)
Nitrite: NEGATIVE
Ph: 6 (ref 4.5–8.0)
Protein: NEGATIVE
Specific Gravity: 1.014 (ref 1.003–1.030)
Squamous Epithelial: 5

## 2013-03-22 LAB — CBC
HCT: 44 % (ref 35.0–47.0)
MCH: 28 pg (ref 26.0–34.0)
MCHC: 33.7 g/dL (ref 32.0–36.0)
Platelet: 210 10*3/uL (ref 150–440)
RBC: 5.31 10*6/uL — ABNORMAL HIGH (ref 3.80–5.20)

## 2013-04-25 ENCOUNTER — Emergency Department: Payer: Self-pay | Admitting: Internal Medicine

## 2014-05-23 ENCOUNTER — Encounter: Payer: Self-pay | Admitting: Gastroenterology

## 2019-04-09 ENCOUNTER — Emergency Department (HOSPITAL_BASED_OUTPATIENT_CLINIC_OR_DEPARTMENT_OTHER)
Admission: EM | Admit: 2019-04-09 | Discharge: 2019-04-09 | Disposition: A | Discharge status: Home / Self Care | Payer: Self-pay | Attending: Emergency Medicine | Admitting: Emergency Medicine

## 2019-04-09 ENCOUNTER — Other Ambulatory Visit: Payer: Self-pay

## 2019-04-09 ENCOUNTER — Encounter (HOSPITAL_BASED_OUTPATIENT_CLINIC_OR_DEPARTMENT_OTHER): Payer: Self-pay | Admitting: *Deleted

## 2019-04-09 DIAGNOSIS — Z880 Allergy status to penicillin: Secondary | ICD-10-CM | POA: Insufficient documentation

## 2019-04-09 DIAGNOSIS — H9203 Otalgia, bilateral: Secondary | ICD-10-CM | POA: Insufficient documentation

## 2019-04-09 DIAGNOSIS — Z79899 Other long term (current) drug therapy: Secondary | ICD-10-CM | POA: Insufficient documentation

## 2019-04-09 DIAGNOSIS — F17200 Nicotine dependence, unspecified, uncomplicated: Secondary | ICD-10-CM | POA: Insufficient documentation

## 2019-04-09 NOTE — ED Notes (Signed)
ED Provider at bedside. 

## 2019-04-09 NOTE — Discharge Instructions (Addendum)
It is suspected that you may have a eustachian tube dysfunction that is causing your symptoms. Take a daily allergy medicine such as claritin or zyrtec to help your symptoms. Avoid swimming if this worsens your symptoms. Follow up with your primary care provider.

## 2019-04-09 NOTE — ED Provider Notes (Signed)
[place] EMERGENCY DEPARTMENT Provider Note   CSN: [id number] Arrival date & time: 04/09/19  1221    History   Chief Complaint Chief Complaint  Patient presents with  . Otalgia    HPI [name] is a 43 y.o. female presenting to the emergency department with complaint of 6 months of bilateral ear discomfort.  She states she is felt as though she has fluid in her ears and can feel it more when her head changes positions.  States 2 days ago her symptoms worsened after swimming with sharp pain to bilateral ears, right worse than left.  She has no drainage, no hearing changes, no upper respiratory infectious symptoms.  She is treated her symptoms with over-the-counter medications without much relief.  She has not seen her PCP for this.     The history is provided by the patient.    Past Medical History:  Diagnosis Date  . Abdominal pain    CHRONIC, SEES DR Erskine Emery AND [facility]  . ADD (attention deficit disorder)   . Anemia   . Anxiety   . Chronic back pain    LOW BACK PAIN  . Depression   . Generalized headaches     Patient Active Problem List   Diagnosis Date Noted  . MENOPAUSE, SURGICAL 07/26/2010  . NAUSEA AND VOMITING 07/16/2010  . IRRITABLE BOWEL SYNDROME 06/15/2010  . NAUSEA 06/15/2010  . Flatulence, eructation, and gas pain 06/15/2010  . RECTAL PROLAPSE 04/30/2010  . LOW BACK PAIN SYNDROME 11/24/2009  . RECTAL BLEEDING 10/26/2009  . DIARRHEA 10/26/2009  . ABDOMINAL PAIN-LLQ 10/26/2009  . ABNORMAL FINDINGS GI TRACT 10/26/2009  . TEMPOROMANDIBULAR JOINT PAIN 05/26/2009  . NECK SPRAIN AND STRAIN 11/15/2008  . ABDOMINAL PAIN, GENERALIZED 02/24/2008  . OTHER ASCITES 02/24/2008  . ANXIETY 08/14/2007  . BRONCHITIS, ACUTE 07/27/2007  . ANEMIA-NOS 04/06/2007  . DEPRESSION 04/06/2007  . ADD 04/06/2007  . UTI'S, CHRONIC 04/06/2007  . HEADACHE 04/06/2007  . IRRITABLE BOWEL SYNDROME, HX OF 04/06/2007    Past Surgical History:   Procedure Laterality Date  . ABDOMINAL HYSTERECTOMY    . COLONOSCOPY  11-15-2009   DR Deatra Ina, CLEAR EXCEPT FOR SOME HEMORRHOIDS  . HEMORRHOID SURGERY  2004   [place], DR. Brantley Stage  . LAPAROSCOPIC ASSISTED VAGINAL HYSTERECTOMY  02-27-2010   WITH A&P REPAIR, DR. Oda Kilts     OB History   No obstetric history on file.      Home Medications    Prior to Admission medications   Medication Sig Start Date End Date Taking? Authorizing Provider  ALPRAZolam Duanne Moron) 1 MG tablet Take 1 mg by mouth 3 (three) times daily.      [provider]  amphetamine-dextroamphetamine (ADDERALL) 30 MG tablet Take 30 mg by mouth 3 (three) times daily.      [provider]  buPROPion (WELLBUTRIN XL) 150 MG 24 hr tablet Take 150 mg by mouth daily.      [provider]  dicyclomine (BENTYL) 20 MG tablet Take 20 mg by mouth 3 (three) times daily as needed. AS NEEDED FOR STOMACH CRAMPS     [provider]  DULoxetine (CYMBALTA) 60 MG capsule Take 60 mg by mouth daily.      [provider]  omeprazole (PRILOSEC) 40 MG capsule Take 40 mg by mouth daily. [place]     [provider]  oxyCODONE (ROXICODONE) 15 MG immediate release tablet Take 15 mg by mouth every 4 (  four) hours as needed.      [provider]  promethazine (PHENERGAN) 25 MG tablet Take 25 mg by mouth every 6 (six) hours as needed.      [provider]  QUEtiapine (SEROQUEL) 50 MG tablet Take 50 mg by mouth at bedtime.      [provider]  rizatriptan (MAXALT-MLT) 10 MG disintegrating tablet Take 10 mg by mouth as needed. AS NEEDED FOR HEADACHE     [provider]    Family History Family History  Problem Relation Age of Onset  . Colon polyps Mother   . Heart disease Mother   . Irritable bowel syndrome Mother     Social History Social History   Tobacco Use  . Smoking status: Current Every Day Smoker  . Smokeless tobacco: Never  Used  Substance Use Topics  . Alcohol use: No  . Drug use: No     Allergies   Morphine, Omnicef [cefdinir], Penicillins, and Propoxyphene n-acetaminophen   Review of Systems Review of Systems  Constitutional: Negative for fever.  HENT: Positive for ear pain and postnasal drip. Negative for congestion, ear discharge, hearing loss, rhinorrhea and sore throat.   Respiratory: Negative for cough.      Physical Exam Updated Vital Signs BP (!) 121/96   Pulse 75   Temp 98.5 F (36.9 C) (Oral)   Resp 18   Ht 5' 7.5" (1.715 m)   Wt 79.2 kg   SpO2 98%   BMI 26.93 kg/m   Physical Exam Vitals signs and nursing note reviewed.  Constitutional:      General: She is not in acute distress.    Appearance: She is well-developed.  HENT:     Head: Normocephalic and atraumatic.     Right Ear: Tympanic membrane, ear canal and external ear normal. No middle ear effusion. No mastoid tenderness.     Left Ear: Tympanic membrane, ear canal and external ear normal.  No middle ear effusion. No mastoid tenderness.     Nose: Nose normal.     Mouth/Throat:     Mouth: Mucous membranes are moist.     Pharynx: Oropharynx is clear.  Eyes:     Conjunctiva/sclera: Conjunctivae normal.  Neck:     Musculoskeletal: Normal range of motion and neck supple. No muscular tenderness.  Cardiovascular:     Rate and Rhythm: Normal rate and regular rhythm.  Pulmonary:     Effort: Pulmonary effort is normal.     Breath sounds: Normal breath sounds.  Lymphadenopathy:     Cervical: No cervical adenopathy.  Neurological:     Mental Status: She is alert.  Psychiatric:        Mood and Affect: Mood normal.        Behavior: Behavior normal.      ED Treatments / Results  Labs (all labs ordered are listed, but only abnormal results are displayed) Labs Reviewed - No data to display  EKG None  Radiology No results found.  Procedures Procedures (including critical care time)  Medications Ordered in ED  Medications - No data to display   Initial Impression / Assessment and Plan / ED Course  I have reviewed the triage vital signs and the nursing notes.  Pertinent labs & imaging results that were available during my care of the patient were reviewed by me and considered in my medical decision making (see chart for details).        Patient presenting with otalgia and feeling as though  she has fluid in her ears for 6 months, worsening 2 days ago after swimming.  No infectious symptoms.  Exam is overall reassuring with normal-appearing TMs and canals.  Not consistent with otitis media or externa.  Suspect possibility of eustachian tube dysfunction.  Encouraged she trial antihistamines and follow-up with her PCP.  Patient requesting ENT referral, stating her PCP will not see her during the COVID-19 pandemic.  Will provide referral.  Safe for discharge.  .Discussed results, findings, treatment and follow up. Patient advised of return precautions. Patient verbalized understanding and agreed with plan.   Final Clinical Impressions(s) / ED Diagnoses   Final diagnoses:  Otalgia of both ears    ED Discharge Orders    None       Robinson, [place] N, PA-C 04/09/19 1328    Raeford RazorKohut, Stephen, MD 04/11/19 [phone]

## 2019-04-09 NOTE — ED Triage Notes (Signed)
Pain in her ears. She has felt fluid in her ears for 2 months.

## 2019-06-21 ENCOUNTER — Emergency Department (HOSPITAL_BASED_OUTPATIENT_CLINIC_OR_DEPARTMENT_OTHER)
Admission: EM | Admit: 2019-06-21 | Discharge: 2019-06-21 | Disposition: A | Discharge status: Home / Self Care | Payer: Medicaid Other | Attending: Emergency Medicine | Admitting: Emergency Medicine

## 2019-06-21 ENCOUNTER — Encounter (HOSPITAL_BASED_OUTPATIENT_CLINIC_OR_DEPARTMENT_OTHER): Payer: Self-pay | Admitting: *Deleted

## 2019-06-21 ENCOUNTER — Other Ambulatory Visit: Payer: Self-pay

## 2019-06-21 DIAGNOSIS — Y9389 Activity, other specified: Secondary | ICD-10-CM | POA: Insufficient documentation

## 2019-06-21 DIAGNOSIS — S161XXA Strain of muscle, fascia and tendon at neck level, initial encounter: Secondary | ICD-10-CM | POA: Insufficient documentation

## 2019-06-21 DIAGNOSIS — G44209 Tension-type headache, unspecified, not intractable: Secondary | ICD-10-CM | POA: Insufficient documentation

## 2019-06-21 DIAGNOSIS — Y9289 Other specified places as the place of occurrence of the external cause: Secondary | ICD-10-CM | POA: Insufficient documentation

## 2019-06-21 DIAGNOSIS — F17200 Nicotine dependence, unspecified, uncomplicated: Secondary | ICD-10-CM | POA: Insufficient documentation

## 2019-06-21 DIAGNOSIS — X509XXA Other and unspecified overexertion or strenuous movements or postures, initial encounter: Secondary | ICD-10-CM | POA: Insufficient documentation

## 2019-06-21 DIAGNOSIS — Y998 Other external cause status: Secondary | ICD-10-CM | POA: Insufficient documentation

## 2019-06-21 DIAGNOSIS — Z79899 Other long term (current) drug therapy: Secondary | ICD-10-CM | POA: Insufficient documentation

## 2019-06-21 DIAGNOSIS — F902 Attention-deficit hyperactivity disorder, combined type: Secondary | ICD-10-CM | POA: Insufficient documentation

## 2019-06-21 MED ORDER — MELOXICAM 7.5 MG PO TABS: 7.5000 mg | ORAL_TABLET | Freq: Every day | ORAL | 0 refills | Status: DC

## 2019-06-21 MED ORDER — DICLOFENAC SODIUM 1 % TD GEL: 2.0000 g | Freq: Four times a day (QID) | TRANSDERMAL | 0 refills | Status: DC

## 2019-06-21 MED ORDER — CYCLOBENZAPRINE HCL 10 MG PO TABS: 10.0000 mg | ORAL_TABLET | Freq: Two times a day (BID) | ORAL | 0 refills | Status: DC | PRN

## 2019-06-21 NOTE — ED Notes (Signed)
Pt. Reports a week ago she picked up a door and felt a pull in her R scapula and upper neck area.  Pt. Has been getting worse for a week.  Pt. Reports taking motrin and Excedrin Tension meds  Pt. Said she feels the pain on both sides of her back neck now and she feels the tension on the sides of each side of her neck   Pt. Able to bend neck down to chest but putting head back is painful

## 2019-06-21 NOTE — ED Provider Notes (Signed)
[facility] EMERGENCY DEPARTMENT Provider Note   CSN: [id number] Arrival date & time: 06/21/19  1225     History   Chief Complaint Chief Complaint  Patient presents with   Neck Pain    HPI [name] is a 43 y.o. female with past medical history of chronic low back pain, generalized headache, anxiety, presenting the emergency department with complaint of right-sided neck pain that began 1 week ago after picking up a door.  She states she immediately felt a pulling sensation in her right neck and had intermittent pain throughout that day.  She states she has been having gradually worsening pain with tension headaches.  She is treated her symptoms with Excedrin tension as well as naproxen.  Heat provides some moderate relief.  Pain is worse when she looks up.  She denies any numbness or weakness in her upper extremities.      The history is provided by the patient.    Past Medical History:  Diagnosis Date   Abdominal pain    CHRONIC, SEES DR Melvia HeapsOBERT KAPLAN AND AMY EASTERWOOD PA   ADD (attention deficit disorder)    Anemia    Anxiety    Chronic back pain    LOW BACK PAIN   Depression    Generalized headaches     Patient Active Problem List   Diagnosis Date Noted   MENOPAUSE, SURGICAL 07/26/2010   NAUSEA AND VOMITING 07/16/2010   IRRITABLE BOWEL SYNDROME 06/15/2010   NAUSEA 06/15/2010   Flatulence, eructation, and gas pain 06/15/2010   RECTAL PROLAPSE 04/30/2010   LOW BACK PAIN SYNDROME 11/24/2009   RECTAL BLEEDING 10/26/2009   DIARRHEA 10/26/2009   ABDOMINAL PAIN-LLQ 10/26/2009   ABNORMAL FINDINGS GI TRACT 10/26/2009   TEMPOROMANDIBULAR JOINT PAIN 05/26/2009   NECK SPRAIN AND STRAIN 11/15/2008   ABDOMINAL PAIN, GENERALIZED 02/24/2008   OTHER ASCITES 02/24/2008   ANXIETY 08/14/2007   BRONCHITIS, ACUTE 07/27/2007   ANEMIA-NOS 04/06/2007   DEPRESSION 04/06/2007   ADD 04/06/2007   UTI'S, CHRONIC 04/06/2007   HEADACHE  04/06/2007   IRRITABLE BOWEL SYNDROME, HX OF 04/06/2007    Past Surgical History:  Procedure Laterality Date   ABDOMINAL HYSTERECTOMY     COLONOSCOPY  11-15-2009   DR Arlyce DiceKAPLAN, CLEAR EXCEPT FOR SOME HEMORRHOIDS   HEMORRHOID SURGERY  2004   PP[facility], DR. Luisa HartORNETT   LAPAROSCOPIC ASSISTED VAGINAL HYSTERECTOMY  02-27-2010   WITH A&P REPAIR, DR. Jarold SongMEISSINGER     OB History   No obstetric history on file.      Home Medications    Prior to Admission medications   Medication Sig Start Date End Date Taking? Authorizing Provider  ALPRAZolam Prudy Feeler(XANAX) 1 MG tablet Take 1 mg by mouth 3 (three) times daily.      [provider]  amphetamine-dextroamphetamine (ADDERALL) 30 MG tablet Take 30 mg by mouth 3 (three) times daily.      [provider]  buPROPion (WELLBUTRIN XL) 150 MG 24 hr tablet Take 150 mg by mouth daily.      [provider]  cyclobenzaprine (FLEXERIL) 10 MG tablet Take 1 tablet (10 mg total) by mouth 2 (two) times daily as needed for muscle spasms. 06/21/19   Daril Warga, [place] N, PA-C  diclofenac sodium (VOLTAREN) 1 % GEL Apply 2 g topically 4 (four) times daily. 06/21/19   Natajah Derderian, [place] N, PA-C  dicyclomine (BENTYL) 20 MG tablet Take 20 mg by mouth 3 (three) times daily as needed. AS NEEDED FOR STOMACH CRAMPS  [provider]  DULoxetine (CYMBALTA) 60 MG capsule Take 60 mg by mouth daily.      [provider]  meloxicam (MOBIC) 7.5 MG tablet Take 1 tablet (7.5 mg total) by mouth daily. 06/21/19   Dornell Grasmick, [place] N, PA-C  omeprazole (PRILOSEC) 40 MG capsule Take 40 mg by mouth daily. [place]     [provider]  oxyCODONE (ROXICODONE) 15 MG immediate release tablet Take 15 mg by mouth every 4 (four) hours as needed.      [provider]  promethazine (PHENERGAN) 25 MG tablet Take 25 mg by mouth every 6 (six) hours as needed.      [provider]  QUEtiapine (SEROQUEL) 50 MG tablet Take 50  mg by mouth at bedtime.      [provider]  rizatriptan (MAXALT-MLT) 10 MG disintegrating tablet Take 10 mg by mouth as needed. AS NEEDED FOR HEADACHE     [provider]    Family History Family History  Problem Relation Age of Onset   Colon polyps Mother    Heart disease Mother    Irritable bowel syndrome Mother     Social History Social History   Tobacco Use   Smoking status: Current Every Day Smoker   Smokeless tobacco: Never Used  Substance Use Topics   Alcohol use: No   Drug use: No     Allergies   Morphine, Omnicef [cefdinir], Penicillins, and Propoxyphene n-acetaminophen   Review of Systems Review of Systems  Musculoskeletal: Positive for neck pain.  Neurological: Negative for weakness and numbness.     Physical Exam Updated Vital Signs BP (!) 138/91 (BP Location: Right Arm)    Pulse 66    Temp 98.4 F (36.9 C) (Oral)    Resp 16    Ht 5\' 8"  (1.727 m)    Wt 77.1 kg    SpO2 97%    BMI 25.85 kg/m   Physical Exam Vitals signs and nursing note reviewed.  Constitutional:      General: She is not in acute distress.    Appearance: She is well-developed.  HENT:     Head: Normocephalic and atraumatic.  Eyes:     Conjunctiva/sclera: Conjunctivae normal.  Cardiovascular:     Rate and Rhythm: Normal rate and regular rhythm.  Pulmonary:     Effort: Pulmonary effort is normal. No respiratory distress.     Breath sounds: Normal breath sounds.  Abdominal:     Palpations: Abdomen is soft.  Musculoskeletal:     Comments: There is some tenderness to right paraspinal musculature of the neck.  The tenderness extends up into the occipital scalp and down into the right trapezius muscle group.  There is no midline tenderness, bony step-offs or deformities.  There is no tenderness to the left neck.  Normal range of motion of the shoulder.  Patient is observed ranging the neck both forward and side to side without difficulty.  Strong and equal grip  strength bilateral upper extremities, normal sensation intact radial pulses.  Skin:    General: Skin is warm.  Neurological:     Mental Status: She is alert.  Psychiatric:        Behavior: Behavior normal.      ED Treatments / Results  Labs (all labs ordered are listed, but only abnormal results are displayed) Labs Reviewed - No data to display  EKG None  Radiology No results found.  Procedures Procedures (including critical care  time)  Medications Ordered in ED Medications - No data to display   Initial Impression / Assessment and Plan / ED Course  I have reviewed the triage vital signs and the nursing notes.  Pertinent labs & imaging results that were available during my care of the patient were reviewed by me and considered in my medical decision making (see chart for details).        Patient presenting with symptoms consistent with muscle strain of the neck.  She has no neuro symptoms or deficits on exam today.  No midline spinal tenderness.  Patient is able to range the neck on evaluation. No indication for advanced imaging at this time. I recommend symptomatic management with muscle relaxers, anti-inflammatories, PCP follow-up as needed.  Safe for discharge.  Discussed results, findings, treatment and follow up. Patient advised of return precautions. Patient verbalized understanding and agreed with plan.   Final Clinical Impressions(s) / ED Diagnoses   Final diagnoses:  Strain of neck muscle, initial encounter    ED Discharge Orders         Ordered    cyclobenzaprine (FLEXERIL) 10 MG tablet  2 times daily PRN     06/21/19 1424    diclofenac sodium (VOLTAREN) 1 % GEL  4 times daily     06/21/19 1424    meloxicam (MOBIC) 7.5 MG tablet  Daily     06/21/19 1424           Shavonte Zhao, [place] N, PA-C 06/21/19 1458    Benjiman Core, MD 06/22/19 1246

## 2019-06-21 NOTE — ED Triage Notes (Signed)
A week ago she picked up a door and felt a pain in her right shoulder with radiation into her neck and head. Pain with turning her head side to side.

## 2019-06-21 NOTE — ED Notes (Signed)
Pt teaching provided on medications that may cause drowsiness. Pt instructed not to drive or operate heavy machinery while taking the prescribed medication. Pt verbalized understanding.   

## 2019-06-21 NOTE — Discharge Instructions (Addendum)
Please read instructions below. Apply ice to your areas of pain for 20 minutes at a time. You can take meloxicam once daily as needed for pain. It is safe to take tylenol/acetaminophen with this medication, but NOT advil/ibuprofen/motrin/naproxen/aleve/aspirin. You can take flexeril every 12 hours as needed for muscle spasm. Be aware this medication can make you drowsy. Schedule an appointment with your primary care provider to follow up on your visit today. You can apply the gel to your area of pain as directed to additional pain relief. Stay hydrated. Follow up with your primary care. Return to the ER for new numbness or weakness in your arms.

## 2019-10-25 ENCOUNTER — Other Ambulatory Visit: Payer: Self-pay

## 2019-10-25 ENCOUNTER — Encounter: Payer: Self-pay | Admitting: Neurology

## 2019-10-25 DIAGNOSIS — G5603 Carpal tunnel syndrome, bilateral upper limbs: Secondary | ICD-10-CM

## 2019-11-02 ENCOUNTER — Other Ambulatory Visit: Payer: Self-pay

## 2019-11-02 ENCOUNTER — Ambulatory Visit (INDEPENDENT_AMBULATORY_CARE_PROVIDER_SITE_OTHER): Payer: Self-pay | Admitting: Neurology

## 2019-11-02 DIAGNOSIS — G5601 Carpal tunnel syndrome, right upper limb: Secondary | ICD-10-CM

## 2019-11-02 DIAGNOSIS — G5603 Carpal tunnel syndrome, bilateral upper limbs: Secondary | ICD-10-CM

## 2019-11-02 DIAGNOSIS — G5621 Lesion of ulnar nerve, right upper limb: Secondary | ICD-10-CM

## 2019-11-02 NOTE — Procedures (Signed)
[facility]  [address]  [place], [place] [address] Tel: [phone] Fax:  [phone] Test Date:  11/02/2019  Patient: [name] DOB: [birth year] Physician: Narda Amber, DO  Sex: Female Height: 5\' 8"  Ref Phys: Elvia Collum, MD  ID#: [id number] Temp: 33.0C Technician:    Patient Complaints: This is a 44 year old female referred for evaluation of bilateral hand paresthesias.  NCV & EMG Findings: Extensive electrodiagnostic testing of the right upper extremity and additional studies of the left shows: 1. Right median sensory response shows prolonged latency (3.5 ms).  Left mixed palmar , left median, and bilateral ulnar sensory responses are within normal limits.   2. Bilateral median and left ulnar motor responses are within normal limits.  Right ulnar motor response shows absolute conduction velocity slowing across the elbow (>12 m/s). 3. There is no evidence of active or chronic motor axonal loss changes affecting any of the tested muscles.  Motor unit configuration and recruitment pattern is within normal limits.    Impression: 1. 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   2. Right ulnar neuropathy with slowing across the elbow, purely demyelinating, mild. 3. There is no evidence of carpal tunnel syndrome, ulnar neuropathy, or cervical radiculopathy affecting the left upper extremity.   ___________________________ Narda Amber, DO    Nerve Conduction Studies Anti Sensory Summary Table   Site NR Peak (ms) Norm Peak (ms) P-T Amp (V) Norm P-T Amp  Left Median Anti Sensory (2nd Digit)  33C  Wrist    2.8 <3.4 38.5 >20  Right Median Anti Sensory (2nd Digit)  33C  Wrist    3.5 <3.4 27.1 >20  Left Ulnar Anti Sensory (5th Digit)  33C  Wrist    2.6 <3.1 41.8 >12  Right Ulnar Anti Sensory (5th Digit)  33C  Wrist    2.4 <3.1 40.9 >12   Motor Summary Table   Site NR Onset (ms) Norm Onset (ms) O-P Amp (mV) Norm O-P Amp Site1 Site2 Delta-0 (ms) Dist (cm) Vel (m/s) Norm Vel (m/s)  Left Median Motor (Abd Poll Brev)  33C  Wrist    3.1 <3.9 10.7 >6 Elbow Wrist 4.9 28.0 57 >50  Elbow    8.0  10.4         Right Median Motor (Abd Poll Brev)  33C  Wrist    3.8 <3.9 9.7 >6 Elbow Wrist 4.8 27.0 56 >50  Elbow    8.6  9.0         Left Ulnar Motor (Abd Dig Minimi)  33C  Wrist    2.0 <3.1 10.2 >7 B Elbow Wrist 3.7 22.0 59 >50  B Elbow    5.7  10.4  A Elbow B Elbow 1.8 10.0 56 >50  A Elbow    7.5  9.6         Right Ulnar Motor (Abd Dig Minimi)  33C  Wrist    2.3 <3.1 9.4 >7 B Elbow Wrist 3.1 22.0 71 >50  B Elbow    5.4  9.4  A Elbow B Elbow 1.9 10.0 53 >50  A Elbow    7.3  9.3          Comparison Summary Table   Site NR Peak (ms) Norm Peak (ms) P-T Amp (V) Site1 Site2 Delta-P (ms) Norm Delta (ms)  Left Median/Ulnar Palm Comparison (Wrist - 8cm)  33C  Median [employer]  1.7 <2.2 54.2 Median Palm Ulnar Palm 0.1   Ulnar Palm    1.6 <2.2 25.7       EMG   Side Muscle Ins Act Fibs Psw Fasc Number Recrt Dur Dur. Amp Amp. Poly Poly. Comment  Right 1stDorInt Nml Nml Nml Nml Nml Nml Nml Nml Nml Nml Nml Nml N/A  Right Abd Poll Brev Nml Nml Nml Nml Nml Nml Nml Nml Nml Nml Nml Nml N/A  Right PronatorTeres Nml Nml Nml Nml Nml Nml Nml Nml Nml Nml Nml Nml N/A  Right Biceps Nml Nml Nml Nml Nml Nml Nml Nml Nml Nml Nml Nml N/A  Right Triceps Nml Nml Nml Nml Nml Nml Nml Nml Nml Nml Nml Nml N/A  Right Deltoid Nml Nml Nml Nml Nml Nml Nml Nml Nml Nml Nml Nml N/A  Right FlexCarpiUln Nml Nml Nml Nml Nml Nml Nml Nml Nml Nml Nml Nml N/A  Left 1stDorInt Nml Nml Nml Nml Nml Nml Nml Nml Nml Nml Nml Nml N/A  Left PronatorTeres Nml Nml Nml Nml Nml Nml Nml Nml Nml Nml Nml Nml N/A  Left Biceps Nml Nml Nml Nml Nml Nml Nml Nml Nml Nml Nml Nml N/A  Left Triceps Nml Nml Nml Nml Nml Nml Nml Nml Nml Nml Nml Nml N/A  Left Deltoid Nml Nml Nml Nml Nml Nml Nml Nml Nml Nml Nml Nml N/A       Waveforms:

## 2020-06-20 ENCOUNTER — Other Ambulatory Visit: Payer: Self-pay

## 2020-06-20 ENCOUNTER — Encounter (HOSPITAL_BASED_OUTPATIENT_CLINIC_OR_DEPARTMENT_OTHER): Payer: Self-pay | Admitting: Emergency Medicine

## 2020-06-20 ENCOUNTER — Emergency Department (HOSPITAL_BASED_OUTPATIENT_CLINIC_OR_DEPARTMENT_OTHER): Payer: Medicaid Other

## 2020-06-20 ENCOUNTER — Emergency Department (HOSPITAL_BASED_OUTPATIENT_CLINIC_OR_DEPARTMENT_OTHER)
Admission: EM | Admit: 2020-06-20 | Discharge: 2020-06-20 | Disposition: A | Discharge status: Home / Self Care | Payer: Medicaid Other | Attending: Emergency Medicine | Admitting: Emergency Medicine

## 2020-06-20 DIAGNOSIS — J449 Chronic obstructive pulmonary disease, unspecified: Secondary | ICD-10-CM | POA: Insufficient documentation

## 2020-06-20 DIAGNOSIS — R0789 Other chest pain: Secondary | ICD-10-CM | POA: Insufficient documentation

## 2020-06-20 DIAGNOSIS — F17200 Nicotine dependence, unspecified, uncomplicated: Secondary | ICD-10-CM | POA: Insufficient documentation

## 2020-06-20 DIAGNOSIS — Z79899 Other long term (current) drug therapy: Secondary | ICD-10-CM | POA: Insufficient documentation

## 2020-06-20 DIAGNOSIS — Z20822 Contact with and (suspected) exposure to covid-19: Secondary | ICD-10-CM | POA: Insufficient documentation

## 2020-06-20 HISTORY — DX: Chronic obstructive pulmonary disease, unspecified: J44.9

## 2020-06-20 LAB — D-DIMER, QUANTITATIVE: D-Dimer, Quant: 0.39 ug/mL-FEU (ref 0.00–0.50)

## 2020-06-20 LAB — BASIC METABOLIC PANEL
Anion gap: 12 (ref 5–15)
BUN: 11 mg/dL (ref 6–20)
CO2: 22 mmol/L (ref 22–32)
Calcium: 9.1 mg/dL (ref 8.9–10.3)
Chloride: 103 mmol/L (ref 98–111)
Creatinine, Ser: 0.53 mg/dL (ref 0.44–1.00)
GFR calc Af Amer: >60 mL/min (ref >60)
GFR calc non Af Amer: >60 mL/min (ref >60)
Glucose, Bld: 96 mg/dL (ref 70–99)
Potassium: 3.7 mmol/L (ref 3.5–5.1)
Sodium: 137 mmol/L (ref 135–145)

## 2020-06-20 LAB — TROPONIN I (HIGH SENSITIVITY)
Troponin I (High Sensitivity): <2 ng/L (ref <18)
Troponin I (High Sensitivity): 2 ng/L (ref <18)

## 2020-06-20 LAB — CBC
HCT: 38.1 % (ref 36.0–46.0)
Hemoglobin: 12.9 g/dL (ref 12.0–15.0)
MCH: 29.5 pg (ref 26.0–34.0)
MCHC: 33.9 g/dL (ref 30.0–36.0)
MCV: 87 fL (ref 80.0–100.0)
Platelets: 226 10*3/uL (ref 150–400)
RBC: 4.38 MIL/uL (ref 3.87–5.11)
RDW: 13.3 % (ref 11.5–15.5)
WBC: 7.7 10*3/uL (ref 4.0–10.5)
nRBC: 0 % (ref 0.0–0.2)

## 2020-06-20 LAB — SARS CORONAVIRUS 2 BY RT PCR (HOSPITAL ORDER, PERFORMED IN ~~LOC~~ HOSPITAL LAB): SARS Coronavirus 2: NEGATIVE

## 2020-06-20 MED ORDER — FENTANYL CITRATE (PF) 100 MCG/2ML IJ SOLN
50.0000 ug | Freq: Once | INTRAMUSCULAR | Status: AC
  Administered 2020-06-20: 50 ug via INTRAVENOUS
  Filled 2020-06-20: qty 2

## 2020-06-20 MED ORDER — ASPIRIN 81 MG PO CHEW
324.0000 mg | CHEWABLE_TABLET | Freq: Once | ORAL | Status: AC
  Administered 2020-06-20: 324 mg via ORAL
  Filled 2020-06-20: qty 4

## 2020-06-20 NOTE — ED Provider Notes (Signed)
[facility] EMERGENCY DEPARTMENT Provider Note   CSN: [id number] Arrival date & time: 06/20/20  1022     History Chief Complaint  Patient presents with  . Chest Pain    [name] is a 44 y.o. female.  HPI   Patient with significant medical history of ADHD, anxiety, COPD, depression, presents to the emergency department with chief complaint of chest pain that started 3 days ago.  Patient states she has some chest heaviness which she feels along her sternum and underneath her left breast.  She explains the heaviness is constant, does not worsen with exertion, and there is no alleviating factors.  She also admits that she gets some shooting sharp pain in in her upper left chest when she is moving her arms.  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  Denies becoming diaphoretic, radiating pain, nausea, vomiting dizziness, paresthesias in the upper or lower extremities.  She has no cardiac history, denying MI, hypertension, diabetes, PEs or DVTs.  Patient explains her kids were diagnosed with Covid 1 month ago and she had a negative Covid test at that time.  She is currently not Covid vaccinated denies any other recent sick contacts.  She denies headache, fever, chills, shortness of breath, abdominal pain, nausea, vomiting, diarrhea, pedal edema.  Past Medical History:  Diagnosis Date  . Abdominal pain    CHRONIC, SEES DR Melvia Heaps AND AMY EASTERWOOD PA  . ADD (attention deficit disorder)   . Anemia   . Anxiety   . Chronic back pain    LOW BACK PAIN  . COPD (chronic obstructive pulmonary disease) (HCC)   . Depression   . Generalized headaches     Patient Active Problem List   Diagnosis Date Noted  . MENOPAUSE, SURGICAL 07/26/2010  . NAUSEA AND VOMITING 07/16/2010  . IRRITABLE BOWEL SYNDROME 06/15/2010  . NAUSEA 06/15/2010  . Flatulence, eructation, and gas pain 06/15/2010  . RECTAL PROLAPSE  04/30/2010  . LOW BACK PAIN SYNDROME 11/24/2009  . RECTAL BLEEDING 10/26/2009  . DIARRHEA 10/26/2009  . ABDOMINAL PAIN-LLQ 10/26/2009  . ABNORMAL FINDINGS GI TRACT 10/26/2009  . TEMPOROMANDIBULAR JOINT PAIN 05/26/2009  . NECK SPRAIN AND STRAIN 11/15/2008  . ABDOMINAL PAIN, GENERALIZED 02/24/2008  . OTHER ASCITES 02/24/2008  . ANXIETY 08/14/2007  . BRONCHITIS, ACUTE 07/27/2007  . ANEMIA-NOS 04/06/2007  . DEPRESSION 04/06/2007  . ADD 04/06/2007  . UTI'S, CHRONIC 04/06/2007  . HEADACHE 04/06/2007  . IRRITABLE BOWEL SYNDROME, HX OF 04/06/2007    Past Surgical History:  Procedure Laterality Date  . ABDOMINAL HYSTERECTOMY    . COLONOSCOPY  11-15-2009   DR Arlyce Dice, CLEAR EXCEPT FOR SOME HEMORRHOIDS  . HEMORRHOID SURGERY  2004   PPH, DR. Luisa Hart  . LAPAROSCOPIC ASSISTED VAGINAL HYSTERECTOMY  02-27-2010   WITH A&P REPAIR, DR. Jarold Song     OB History   No obstetric history on file.     Family History  Problem Relation Age of Onset  . Colon polyps Mother   . Heart disease Mother   . Irritable bowel syndrome Mother     Social History   Tobacco Use  . Smoking status: Current Every Day Smoker  . Smokeless tobacco: Never Used  Substance Use Topics  . Alcohol use: No  . Drug use: No    Home Medications Prior to Admission medications   Medication Sig Start Date End Date Taking? Authorizing Provider  ALPRAZolam Prudy Feeler)  1 MG tablet Take 1 mg by mouth 3 (three) times daily.      [provider]  amphetamine-dextroamphetamine (ADDERALL) 30 MG tablet Take 30 mg by mouth 3 (three) times daily.      [provider]  buPROPion (WELLBUTRIN XL) 150 MG 24 hr tablet Take 150 mg by mouth daily.      [provider]  cyclobenzaprine (FLEXERIL) 10 MG tablet Take 1 tablet (10 mg total) by mouth 2 (two) times daily as needed for muscle spasms. 06/21/19   Robinson, [place] N, PA-C  diclofenac sodium (VOLTAREN) 1 % GEL Apply 2 g topically 4 (four) times daily. 06/21/19    Robinson, [place] N, PA-C  dicyclomine (BENTYL) 20 MG tablet Take 20 mg by mouth 3 (three) times daily as needed. AS NEEDED FOR STOMACH CRAMPS     [provider]  DULoxetine (CYMBALTA) 60 MG capsule Take 60 mg by mouth daily.      [provider]  meloxicam (MOBIC) 7.5 MG tablet Take 1 tablet (7.5 mg total) by mouth daily. 06/21/19   Robinson, [place] N, PA-C  omeprazole (PRILOSEC) 40 MG capsule Take 40 mg by mouth daily. 1 EACH DAY 30 MINUTES BEFORE MEAL     [provider]  oxyCODONE (ROXICODONE) 15 MG immediate release tablet Take 15 mg by mouth every 4 (four) hours as needed.      [provider]  promethazine (PHENERGAN) 25 MG tablet Take 25 mg by mouth every 6 (six) hours as needed.      [provider]  QUEtiapine (SEROQUEL) 50 MG tablet Take 50 mg by mouth at bedtime.      [provider]  rizatriptan (MAXALT-MLT) 10 MG disintegrating tablet Take 10 mg by mouth as needed. AS NEEDED FOR HEADACHE     [provider]    Allergies    Morphine, Omnicef [cefdinir], Penicillins, and Propoxyphene n-acetaminophen  Review of Systems   Review of Systems  Constitutional: Negative for chills and fever.  HENT: Negative for congestion.   Respiratory: Negative for cough and shortness of breath.   Cardiovascular: Positive for chest pain. Negative for palpitations and leg swelling.  Gastrointestinal: Negative for abdominal pain, diarrhea, nausea and vomiting.  Genitourinary: Negative for enuresis, flank pain and frequency.  Musculoskeletal: Negative for back pain.  Skin: Negative for rash.  Neurological: Negative for dizziness and headaches.  Hematological: Does not bruise/bleed easily.    Physical Exam Updated Vital Signs BP (!) 141/89   Pulse 70   Temp 97.7 F (36.5 C) (Oral)   Resp 12   Ht 5\' 7"  (1.702 m)   Wt 73.9 kg   SpO2 97%   BMI 25.53 kg/m   Physical Exam Vitals and nursing note reviewed.  Constitutional:       General: She is not in acute distress.    Appearance: She is not ill-appearing.  HENT:     Head: Normocephalic and atraumatic.     Nose: No congestion.     Mouth/Throat:     Mouth: Mucous membranes are moist.     Pharynx: Oropharynx is clear.  Eyes:     General: No scleral icterus. Cardiovascular:     Rate and Rhythm: Normal rate and regular rhythm.     Pulses: Normal pulses.     Heart sounds: No murmur heard.  No friction rub. No gallop.      Comments: Patient's chest is visualized, no gross abnormalities noted, good rise and fall during respirations.  No flail chest noted.  Slight tender to palpation along the anterior lateral part of her left chest.  Pain could be reproducible upon palpation. Pulmonary:     Effort: No respiratory distress.     Breath sounds: No wheezing, rhonchi or rales.  Abdominal:     General: There is no distension.     Palpations: Abdomen is soft.     Tenderness: There is no abdominal tenderness. There is no right CVA tenderness, left CVA tenderness or guarding.  Musculoskeletal:        General: No swelling.     Right lower leg: No edema.     Left lower leg: No edema.  Skin:    General: Skin is warm and dry.     Capillary Refill: Capillary refill takes less than 2 seconds.     Findings: No rash.  Neurological:     General: No focal deficit present.     Mental Status: She is alert and oriented to person, place, and time.  Psychiatric:        Mood and Affect: Mood normal.     ED Results / Procedures / Treatments   Labs (all labs ordered are listed, but only abnormal results are displayed) Labs Reviewed  SARS CORONAVIRUS 2 BY RT PCR (HOSPITAL ORDER, PERFORMED IN [facility] [facility])  BASIC METABOLIC PANEL  CBC  D-DIMER, QUANTITATIVE (NOT AT [facility])  TROPONIN I (HIGH SENSITIVITY)  TROPONIN I (HIGH SENSITIVITY)    EKG   Radiology DG Chest 2 View  Result Date: 06/20/2020 CLINICAL DATA:  Left side chest pain X 1 day as well as arm weakness.  No history of malignancy. Current smoker. EXAM: CHEST - 2 VIEW COMPARISON:  Chest x-ray 07/18/2010 FINDINGS: The heart size and mediastinal contours are within normal limits. Both lungs are clear. The visualized skeletal structures are unremarkable. IMPRESSION: No active cardiopulmonary disease. Electronically Signed   By: Tish Frederickson M.D.   On: 06/20/2020 10:54    Procedures Procedures (including critical care time)  Medications Ordered in ED Medications  aspirin chewable tablet 324 mg (324 mg Oral Given 06/20/20 1144)  fentaNYL (SUBLIMAZE) injection 50 mcg (50 mcg Intravenous Given 06/20/20 1414)    ED Course  I have reviewed the triage vital signs and the nursing notes.  Pertinent labs & imaging results that were available during my care of the patient were reviewed by me and considered in my medical decision making (see chart for details).    MDM Rules/Calculators/A&P                          I have personally reviewed all imaging, labs and have interpreted them.  Patient presents with chest pain. patient was alert and oriented did not appear to be in acute distress, vital signs reassuring.  On exam patient had tender to palpation on the left anterior side of her chest along the third and fourth rib.  No gross abnormalities noted.  Pain was reproducible.  Lung sounds were clear bilaterally, no abdominal pain, no pedal edema noted.  Will order screening labs, chest x-ray, troponins as well as D-dimer for further evaluation.  Patient CBC does not show leukocytosis or anemia, D-dimer was 0.39, BMP does not show electrolyte abnormalities or AKI, Covid test was negative, initial troponin was less than 2, second troponin was stable.  Chest x-ray does not show any acute abnormalities, EKG was sinus rhythm without signs of ischemia.  Patient was given  aspirin and morphine which she states made her pain go away.  I have low suspicion patient suffering from a cardiac abnormality as both  troponins were less than 2, no signs of ischemia noted on exam, pain was reproducible upon palpation and pain increased when patient moves her left arm.  I suspect she has a muscular strain or costochondritis.  Low suspicion for PE or DVT as there is no pedal edema noted on exam, patient denies pleuritic chest pain, D-dimer was 0.39.  Low suspicion for systemic infection as patient is nontoxic-appearing, vital signs reassuring, no leukocytosis noted on CBC.  Patient is resting comfortably showing no acute signs distress.  Vital signs remained stable no indication for hospital admission.  I suspect patient is suffering from muscular strain, but differential diagnosis includes costochondritis, GERD, anxiety.  Recommend over-the-counter pain medications and follow-up with her primary care doctor or cardiologist for further evaluation.  Patient was given at home care and strict return precautions.  Patient verbalized that she understood and agrees to plan. Final Clinical Impression(s) / ED Diagnoses Final diagnoses:  Chest wall pain    Rx / DC Orders ED Discharge Orders    None       Carroll Sage, PA-C 06/20/20 1508    Terrilee Files, MD 06/20/20 [phone]

## 2020-06-20 NOTE — ED Triage Notes (Signed)
Pt c/o left side chest pain x 3 days, with worsening today. Pt also endorses feeling lightheaded. Pt denies N/V, denies radiation of pain. Reports family Covid + x 1 month pta, not vaccinated

## 2020-06-20 NOTE — Discharge Instructions (Signed)
You have been seen here for chest pain.  Lab work and imaging all look reassuring.  I recommend taking over-the-counter medications like ibuprofen and/or Tylenol every 6 hours as needed for pain.  Please follow dosing the back of bottle.  I have given you the contact information for a cardiologist please call as I feel you may need further evaluation.  I also gave you  the contact information for [facility] they work with individuals with little to no insurance and they can help you find a primary care provider.  Please contact your earliest convenience.  When she come back to emergency department if you develop severe chest pain, shortness of breath, severe abdominal pain, uncontrolled nausea, vomiting, diarrhea as these symptoms require further evaluation management.

## 2020-09-04 ENCOUNTER — Ambulatory Visit
Admission: EM | Admit: 2020-09-04 | Discharge: 2020-09-04 | Disposition: A | Discharge status: Home / Self Care | Payer: 59 | Attending: Emergency Medicine | Admitting: Emergency Medicine

## 2020-09-04 DIAGNOSIS — Z20822 Contact with and (suspected) exposure to covid-19: Secondary | ICD-10-CM

## 2020-09-04 DIAGNOSIS — J069 Acute upper respiratory infection, unspecified: Secondary | ICD-10-CM

## 2020-09-04 MED ORDER — BENZONATATE 100 MG PO CAPS: 100.0000 mg | ORAL_CAPSULE | Freq: Three times a day (TID) | ORAL | 0 refills | Status: DC

## 2020-09-04 MED ORDER — DOXYCYCLINE HYCLATE 100 MG PO CAPS: 100.0000 mg | ORAL_CAPSULE | Freq: Two times a day (BID) | ORAL | 0 refills | Status: AC

## 2020-09-04 MED ORDER — CETIRIZINE HCL 10 MG PO TABS: 10.0000 mg | ORAL_TABLET | Freq: Every day | ORAL | 0 refills | Status: DC

## 2020-09-04 MED ORDER — FLUTICASONE PROPIONATE 50 MCG/ACT NA SUSP: 1.0000 | Freq: Every day | NASAL | 0 refills | Status: DC

## 2020-09-04 MED ORDER — AEROCHAMBER PLUS FLO-VU MEDIUM MISC: 1.0000 | Freq: Once | 0 refills | Status: AC

## 2020-09-04 MED ORDER — PREDNISONE 50 MG PO TABS: 50.0000 mg | ORAL_TABLET | Freq: Every day | ORAL | 0 refills | Status: DC

## 2020-09-04 NOTE — ED Triage Notes (Addendum)
Pt states had a positive covid exposure Friday a week ago. States developed productive cough with green sputum, fever, and SOB x1wk. States cough has worsen in the past 3 days. Taking OTC meds with no relief. States had a neg. covid test a wk ago.

## 2020-09-04 NOTE — ED Provider Notes (Signed)
[name]    CSN: [id number] Arrival date & time: 09/04/20  0915      History   Chief Complaint Chief Complaint  Patient presents with  . Cough    HPI [name] is a 44 y.o. female  Presenting for Covid testing: Exposure: coworker Date of exposure: daily M-F Any fever, symptoms since exposure: yes.  Fever, cough, nasal congestion x 1 wk, worse the last 3 days.  Past Medical History:  Diagnosis Date  . Abdominal pain    CHRONIC, SEES DR Melvia Heaps AND AMY EASTERWOOD PA  . ADD (attention deficit disorder)   . Anemia   . Anxiety   . Chronic back pain    LOW BACK PAIN  . COPD (chronic obstructive pulmonary disease) (HCC)   . Depression   . Generalized headaches     Patient Active Problem List   Diagnosis Date Noted  . MENOPAUSE, SURGICAL 07/26/2010  . NAUSEA AND VOMITING 07/16/2010  . IRRITABLE BOWEL SYNDROME 06/15/2010  . NAUSEA 06/15/2010  . Flatulence, eructation, and gas pain 06/15/2010  . RECTAL PROLAPSE 04/30/2010  . LOW BACK PAIN SYNDROME 11/24/2009  . RECTAL BLEEDING 10/26/2009  . DIARRHEA 10/26/2009  . ABDOMINAL PAIN-LLQ 10/26/2009  . ABNORMAL FINDINGS GI TRACT 10/26/2009  . TEMPOROMANDIBULAR JOINT PAIN 05/26/2009  . NECK SPRAIN AND STRAIN 11/15/2008  . ABDOMINAL PAIN, GENERALIZED 02/24/2008  . OTHER ASCITES 02/24/2008  . ANXIETY 08/14/2007  . BRONCHITIS, ACUTE 07/27/2007  . ANEMIA-NOS 04/06/2007  . DEPRESSION 04/06/2007  . ADD 04/06/2007  . UTI'S, CHRONIC 04/06/2007  . HEADACHE 04/06/2007  . IRRITABLE BOWEL SYNDROME, HX OF 04/06/2007    Past Surgical History:  Procedure Laterality Date  . ABDOMINAL HYSTERECTOMY    . COLONOSCOPY  11-15-2009   DR Arlyce Dice, CLEAR EXCEPT FOR SOME HEMORRHOIDS  . HEMORRHOID SURGERY  2004   [facility], DR. Luisa Hart  . LAPAROSCOPIC ASSISTED VAGINAL HYSTERECTOMY  02-27-2010   WITH A&P REPAIR, DR. Jarold Song    OB History   No obstetric history on file.      Home Medications    Prior to  Admission medications   Medication Sig Start Date End Date Taking? Authorizing Provider  benzonatate (TESSALON) 100 MG capsule Take 1 capsule (100 mg total) by mouth every 8 (eight) hours. 09/04/20   Hall-Potvin, [place], PA-C  cetirizine (ZYRTEC ALLERGY) 10 MG tablet Take 1 tablet (10 mg total) by mouth daily. 09/04/20   Hall-Potvin, [place], PA-C  doxycycline (VIBRAMYCIN) 100 MG capsule Take 1 capsule (100 mg total) by mouth 2 (two) times daily for 5 days. 09/04/20 09/09/20  Hall-Potvin, [place], PA-C  fluticasone (FLONASE) 50 MCG/ACT nasal spray Place 1 spray into both nostrils daily. 09/04/20   Hall-Potvin, [place], PA-C  predniSONE (DELTASONE) 50 MG tablet Take 1 tablet (50 mg total) by mouth daily with breakfast. 09/04/20   Hall-Potvin, [place], PA-C  Spacer/Aero-Holding Chambers (AEROCHAMBER PLUS FLO-VU MEDIUM) MISC 1 each by Other route once for 1 dose. 09/04/20 09/04/20  Hall-Potvin, [place], PA-C    Family History Family History  Problem Relation Age of Onset  . Colon polyps Mother   . Heart disease Mother   . Irritable bowel syndrome Mother     Social History Social History   Tobacco Use  . Smoking status: Current Every Day Smoker  . Smokeless tobacco: Never Used  Substance Use Topics  . Alcohol use: No  . Drug use: No     Allergies   Morphine, Omnicef [cefdinir], Penicillins, and Propoxyphene n-acetaminophen  Review of Systems Review of Systems  Constitutional: Positive for chills, fatigue and fever.  HENT: Positive for congestion, postnasal drip and rhinorrhea. Negative for ear pain, sinus pain, sore throat and voice change.   Eyes: Negative for pain, redness and visual disturbance.  Respiratory: Positive for cough, chest tightness and wheezing. Negative for shortness of breath.   Cardiovascular: Negative for chest pain and palpitations.  Gastrointestinal: Negative for abdominal pain, diarrhea and vomiting.  Musculoskeletal: Negative for arthralgias and  myalgias.  Skin: Negative for rash and wound.  Neurological: Negative for syncope and headaches.     Physical Exam Triage Vital Signs ED Triage Vitals  Enc Vitals Group     BP 09/04/20 0942 121/81     Pulse Rate 09/04/20 0942 99     Resp 09/04/20 0942 20     Temp 09/04/20 0942 98.3 F (36.8 C)     Temp Source 09/04/20 0942 Oral     SpO2 09/04/20 0942 92 %     Weight --      Height --      Head Circumference --      Peak Flow --      Pain Score 09/04/20 0946 0     Pain Loc --      Pain Edu? --      Excl. in GC? --    No data found.  Updated Vital Signs BP 121/81 (BP Location: Left Arm)   Pulse 99   Temp 98.3 F (36.8 C) (Oral)   Resp 20   SpO2 94%   Visual Acuity Right Eye Distance:   Left Eye Distance:   Bilateral Distance:    Right Eye Near:   Left Eye Near:    Bilateral Near:     Physical Exam Constitutional:      General: She is not in acute distress.    Appearance: She is not ill-appearing or diaphoretic.  HENT:     Head: Normocephalic and atraumatic.     Mouth/Throat:     Mouth: Mucous membranes are moist.     Pharynx: Oropharynx is clear. No oropharyngeal exudate or posterior oropharyngeal erythema.  Eyes:     General: No scleral icterus.    Conjunctiva/sclera: Conjunctivae normal.     Pupils: Pupils are equal, round, and reactive to light.  Neck:     Comments: Trachea midline, negative JVD Cardiovascular:     Rate and Rhythm: Normal rate and regular rhythm.     Heart sounds: No murmur heard.  No gallop.   Pulmonary:     Effort: Pulmonary effort is normal. No respiratory distress.     Breath sounds: Wheezing and rhonchi present. No rales.     Comments: scattered Musculoskeletal:     Cervical back: Neck supple. No tenderness.  Lymphadenopathy:     Cervical: No cervical adenopathy.  Skin:    Capillary Refill: Capillary refill takes less than 2 seconds.     Coloration: Skin is not jaundiced or pale.     Findings: No rash.  Neurological:      General: No focal deficit present.     Mental Status: She is alert and oriented to person, place, and time.      UC Treatments / Results  Labs (all labs ordered are listed, but only abnormal results are displayed) Labs Reviewed  NOVEL CORONAVIRUS, NAA    EKG   Radiology No results found.  Procedures Procedures (including critical care time)  Medications Ordered in UC Medications - No data  to display  Initial Impression / Assessment and Plan / UC Course  I have reviewed the triage vital signs and the nursing notes.  Pertinent labs & imaging results that were available during my care of the patient were reviewed by me and considered in my medical decision making (see chart for details).     Patient afebrile, nontoxic, with SpO2 94%.  Covid PCR pending.  Patient to quarantine until results are back.  We will treat supportively as outlined below.  Return precautions discussed, patient verbalized understanding and is agreeable to plan. Final Clinical Impressions(s) / UC Diagnoses   Final diagnoses:  Exposure to COVID-19 virus  URI with cough and congestion     Discharge Instructions     Tessalon for cough. Start flonase, atrovent nasal spray for nasal congestion/drainage. You can use over the counter nasal saline rinse such as neti pot for nasal congestion. Keep hydrated, your urine should be clear to pale yellow in color. Tylenol/motrin for fever and pain. Monitor for any worsening of symptoms, chest pain, shortness of breath, wheezing, swelling of the throat, go to the emergency department for further evaluation needed.     ED Prescriptions    Medication Sig Dispense Auth. Provider   benzonatate (TESSALON) 100 MG capsule Take 1 capsule (100 mg total) by mouth every 8 (eight) hours. 21 capsule Hall-Potvin, [place], PA-C   predniSONE (DELTASONE) 50 MG tablet Take 1 tablet (50 mg total) by mouth daily with breakfast. 5 tablet Hall-Potvin, [place], PA-C   doxycycline  (VIBRAMYCIN) 100 MG capsule Take 1 capsule (100 mg total) by mouth 2 (two) times daily for 5 days. 10 capsule Hall-Potvin, [place], PA-C   Spacer/Aero-Holding Chambers (AEROCHAMBER PLUS FLO-VU MEDIUM) MISC 1 each by Other route once for 1 dose. 1 each Hall-Potvin, [place], PA-C   cetirizine (ZYRTEC ALLERGY) 10 MG tablet Take 1 tablet (10 mg total) by mouth daily. 30 tablet Hall-Potvin, [place], PA-C   fluticasone (FLONASE) 50 MCG/ACT nasal spray Place 1 spray into both nostrils daily. 16 g Hall-Potvin, [place], PA-C     PDMP not reviewed this encounter.   Hall-Potvin, [place], [place] 09/04/20 1004

## 2020-09-04 NOTE — Discharge Instructions (Signed)

## 2020-09-05 LAB — SARS-COV-2, NAA 2 DAY TAT

## 2020-09-05 LAB — NOVEL CORONAVIRUS, NAA: SARS-CoV-2, NAA: NOT DETECTED

## 2020-09-28 ENCOUNTER — Ambulatory Visit
Admission: EM | Admit: 2020-09-28 | Discharge: 2020-09-28 | Disposition: A | Discharge status: Home / Self Care | Payer: 59 | Attending: Emergency Medicine | Admitting: Emergency Medicine

## 2020-09-28 ENCOUNTER — Ambulatory Visit (INDEPENDENT_AMBULATORY_CARE_PROVIDER_SITE_OTHER): Payer: 59

## 2020-09-28 ENCOUNTER — Other Ambulatory Visit: Payer: Self-pay

## 2020-09-28 DIAGNOSIS — R059 Cough, unspecified: Secondary | ICD-10-CM

## 2020-09-28 DIAGNOSIS — J209 Acute bronchitis, unspecified: Secondary | ICD-10-CM

## 2020-09-28 DIAGNOSIS — R0602 Shortness of breath: Secondary | ICD-10-CM | POA: Diagnosis not present

## 2020-09-28 MED ORDER — AZITHROMYCIN 250 MG PO TABS: 250.0000 mg | ORAL_TABLET | Freq: Every day | ORAL | 0 refills | Status: DC

## 2020-09-28 MED ORDER — NICOTINE 14 MG/24HR TD PT24: 14.0000 mg | MEDICATED_PATCH | Freq: Every day | TRANSDERMAL | 0 refills | Status: DC

## 2020-09-28 MED ORDER — GUAIFENESIN-CODEINE 100-10 MG/5ML PO SOLN: 5.0000 mL | Freq: Three times a day (TID) | ORAL | 0 refills | Status: DC | PRN

## 2020-09-28 MED ORDER — METHYLPREDNISOLONE SODIUM SUCC 125 MG IJ SOLR
125.0000 mg | Freq: Once | INTRAMUSCULAR | Status: AC
  Administered 2020-09-28: 17:00:00 125 mg via INTRAMUSCULAR

## 2020-09-28 MED ORDER — PREDNISONE 50 MG PO TABS: 50.0000 mg | ORAL_TABLET | Freq: Every day | ORAL | 0 refills | Status: DC

## 2020-09-28 NOTE — ED Triage Notes (Signed)
Patient states she has had a cough, headache x 1 week. Pt also states a sore throat this am. Pt is aox4 and ambulatory.

## 2020-09-28 NOTE — Discharge Instructions (Signed)
We gave you an injection of Solu-Medrol, continue with prednisone daily for 5 days beginning tomorrow morning Begin azithromycin-2 tablets today, 1 tablet for the following 4 days May continue with over-the-counter cough/congestion medicine, may use cough syrup provided at bedtime-will cause drowsiness, do not drive or work after taking Honey and hot tea Tylenol and ibuprofen as needed Follow-up if not improving or worsening

## 2020-09-28 NOTE — ED Provider Notes (Signed)
[name]    CSN: [id number] Arrival date & time: 09/28/20  1448      History   Chief Complaint Chief Complaint  Patient presents with   Generalized Body Aches    X 1 week   Headache    X 1 week   Sore Throat    HPI [name] is a 44 y.o. female presenting today for evaluation of cough body aches and headache.  Patient reports that she has had a cough and headache for approximately 1 week.  Today has developed a sore throat.  Reports increase shortness of breath and wheezing.  Recently was treated for bronchitis approximately 1 month ago.  Reports possible Covid exposure at work.  HPI  Past Medical History:  Diagnosis Date   Abdominal pain    CHRONIC, SEES DR Melvia Heaps AND AM[facility] E[facility]ER[facility] PA   ADD (attention deficit disorder)    Anemia    Anxiety    Chronic back pain    LOW BACK PAIN   COPD (chronic obstructive pulmonary disease) (HCC)    Depression    Generalized headaches     Patient Active Problem List   Diagnosis Date Noted   MENOPAUSE, SURGICAL 07/26/2010   NAUSEA AND VOMITING 07/16/2010   IRRITABLE BOWEL SYNDROME 06/15/2010   NAUSEA 06/15/2010   Flatulence, eructation, and gas pain 06/15/2010   RECTAL PROLAPSE 04/30/2010   LOW BACK PAIN SYNDROME 11/24/2009   RECTAL BLEEDING 10/26/2009   DIARRHEA 10/26/2009   ABDOMINAL PAIN-LLQ 10/26/2009   ABNORMAL FINDINGS GI TRACT 10/26/2009   TEMPOROMANDIBULAR JOINT PAIN 05/26/2009   NECK SPRAIN AND STRAIN 11/15/2008   ABDOMINAL PAIN, GENERALIZED 02/24/2008   OTHER ASCITES 02/24/2008   ANXIETY 08/14/2007   BRONCHITIS, ACUTE 07/27/2007   ANEMIA-NOS 04/06/2007   DEPRESSION 04/06/2007   ADD 04/06/2007   UTI'S, CHRONIC 04/06/2007   HEADACHE 04/06/2007   IRRITABLE BOWEL SYNDROME, HX OF 04/06/2007    Past Surgical History:  Procedure Laterality Date   ABDOMINAL HYSTERECTOMY     COLONOSCOPY  11-15-2009   DR Arlyce Dice, CLEAR EXCEPT FOR SOME HEMORRHOIDS    HEMORRHOID SURGERY  2004   [facility], DR. Luisa Hart   LAPAROSCOPIC ASSISTED VAGINAL HYSTERECTOMY  02-27-2010   WITH A&P REPAIR, DR. Jarold Song    OB History   No obstetric history on file.      Home Medications    Prior to Admission medications   Medication Sig Start Date End Date Taking? Authorizing Provider  azithromycin (ZITHROMAX) 250 MG tablet Take 1 tablet (250 mg total) by mouth daily. Take first 2 tablets together, then 1 every day until finished. 09/28/20   Yulianna Folse C, PA-C  benzonatate (TESSALON) 100 MG capsule Take 1 capsule (100 mg total) by mouth every 8 (eight) hours. 09/04/20   Hall-Potvin, [place], PA-C  fluticasone (FLONASE) 50 MCG/ACT nasal spray Place 1 spray into both nostrils daily. 09/04/20   Hall-Potvin, [place], PA-C  guaiFENesin-codeine 100-10 MG/5ML syrup Take 5-10 mLs by mouth 3 (three) times daily as needed for cough. 09/28/20   Anthony Roland C, PA-C  predniSONE (DELTASONE) 50 MG tablet Take 1 tablet (50 mg total) by mouth daily with breakfast. 09/28/20   Jake Fuhrmann C, PA-C  cetirizine (ZYRTEC ALLERGY) 10 MG tablet Take 1 tablet (10 mg total) by mouth daily. 09/04/20 09/28/20  Hall-Potvin, [place], PA-C    Family History Family History  Problem Relation Age of Onset   Colon polyps Mother    Heart disease Mother    Irritable bowel  syndrome Mother     Social History Social History   Tobacco Use   Smoking status: Current Every Day Smoker   Smokeless tobacco: Never Used  Building services engineer Use: Never used  Substance Use Topics   Alcohol use: No   Drug use: No     Allergies   Morphine, Omnicef [cefdinir], Penicillins, and Propoxyphene n-acetaminophen   Review of Systems Review of Systems  Constitutional: Negative for activity change, appetite change, chills, fatigue and fever.  HENT: Positive for congestion, rhinorrhea, sinus pressure and sore throat. Negative for ear pain and trouble swallowing.   Eyes: Negative for  discharge and redness.  Respiratory: Positive for cough, shortness of breath and wheezing. Negative for chest tightness.   Cardiovascular: Negative for chest pain.  Gastrointestinal: Negative for abdominal pain, diarrhea, nausea and vomiting.  Musculoskeletal: Negative for myalgias.  Skin: Negative for rash.  Neurological: Negative for dizziness, light-headedness and headaches.     Physical Exam Triage Vital Signs ED Triage Vitals  Enc Vitals Group     BP --      Pulse Rate 09/28/20 1516 80     Resp 09/28/20 1516 18     Temp 09/28/20 1516 98.1 F (36.7 C)     Temp Source 09/28/20 1516 Oral     SpO2 09/28/20 1516 95 %     Weight --      Height --      Head Circumference --      Peak Flow --      Pain Score 09/28/20 1518 6     Pain Loc --      Pain Edu? --      Excl. in GC? --    No data found.  Updated Vital Signs Pulse 80    Temp 98.1 F (36.7 C) (Oral)    Resp 18    SpO2 95%   Visual Acuity Right Eye Distance:   Left Eye Distance:   Bilateral Distance:    Right Eye Near:   Left Eye Near:    Bilateral Near:     Physical Exam Vitals and nursing note reviewed.  Constitutional:      Appearance: She is well-developed and well-nourished.     Comments: No acute distress  HENT:     Head: Normocephalic and atraumatic.     Ears:     Comments: Bilateral ears without tenderness to palpation of external auricle, tragus and mastoid, EAC's without erythema or swelling, TM's with good bony landmarks and cone of light. Non erythematous.     Nose: Nose normal.     Mouth/Throat:     Comments: Oral mucosa pink and moist, no tonsillar enlargement or exudate. Posterior pharynx patent and nonerythematous, no uvula deviation or swelling. Normal phonation. Eyes:     Conjunctiva/sclera: Conjunctivae normal.  Cardiovascular:     Rate and Rhythm: Normal rate.  Pulmonary:     Effort: Pulmonary effort is normal. No respiratory distress.     Comments: Inspiratory and expiratory  wheezing throughout bilateral lung fields, frequent coarse cough in room Abdominal:     General: There is no distension.  Musculoskeletal:        General: Normal range of motion.     Cervical back: Neck supple.  Skin:    General: Skin is warm and dry.  Neurological:     Mental Status: She is alert and oriented to person, place, and time.  Psychiatric:        Mood and Affect:  Mood and affect normal.      UC Treatments / Results  Labs (all labs ordered are listed, but only abnormal results are displayed) Labs Reviewed - No data to display  EKG   Radiology DG Chest 2 View  Result Date: 09/28/2020 CLINICAL DATA:  Cough and short of breath EXAM: CHEST - 2 VIEW COMPARISON:  06/20/2020 FINDINGS: No focal consolidation or effusion. Slight increased central airways thickening. Normal heart size. No pneumothorax. IMPRESSION: Slightly increased central airways thickening, possible bronchitis. No focal pneumonia. Electronically Signed   By: Jasmine Pang M.D.   On: 09/28/2020 16:19    Procedures Procedures (including critical care time)  Medications Ordered in UC Medications  methylPREDNISolone sodium succinate (SOLU-MEDROL) 125 mg/2 mL injection 125 mg (has no administration in time range)    Initial Impression / Assessment and Plan / UC Course  I have reviewed the triage vital signs and the nursing notes.  Pertinent labs & imaging results that were available during my care of the patient were reviewed by me and considered in my medical decision making (see chart for details).     X-ray negative for signs of pneumonia.  Treating for bronchitis, Solu-Medrol provided prior to discharge, continuing on prednisone 50 mg x 5 days, continue albuterol inhaler which patient already has prescribed to her.  Symptomatic and supportive care of cough and congestion.  Given symptoms greater than 1 week covering with azithromycin as she recently was on doxycycline in last month.  Discussed  strict return precautions. Patient verbalized understanding and is agreeable with plan.  Final Clinical Impressions(s) / UC Diagnoses   Final diagnoses:  Acute bronchitis, unspecified organism     Discharge Instructions     We gave you an injection of Solu-Medrol, continue with prednisone daily for 5 days beginning tomorrow morning Begin azithromycin-2 tablets today, 1 tablet for the following 4 days May continue with over-the-counter cough/congestion medicine, may use cough syrup provided at bedtime-will cause drowsiness, do not drive or work after taking Honey and hot tea Tylenol and ibuprofen as needed Follow-up if not improving or worsening    ED Prescriptions    Medication Sig Dispense Auth. Provider   predniSONE (DELTASONE) 50 MG tablet Take 1 tablet (50 mg total) by mouth daily with breakfast. 5 tablet Aubryanna Nesheim C, PA-C   guaiFENesin-codeine 100-10 MG/5ML syrup Take 5-10 mLs by mouth 3 (three) times daily as needed for cough. 120 mL Herlinda Heady C, PA-C   azithromycin (ZITHROMAX) 250 MG tablet Take 1 tablet (250 mg total) by mouth daily. Take first 2 tablets together, then 1 every day until finished. 6 tablet Joyel Chenette, [place] C, PA-C     I have reviewed the PDMP during this encounter.   Lew Dawes, [place] 09/28/20 [phone]

## 2020-09-29 NOTE — H&P (Signed)
REASON FOR VISIT: Follow-up right carpal tunnel and cubital tunnel symptoms  HPI:  She is 44 years old.  She has relatively consistent numbness in her median nerve distribution. This occasionally gets better and worse. She has had EMG's performed which show consistency with carpal tunnel syndrome as well as cubital tunnel syndrome.  She notes activity and sleeping will cause her ulnar nerve distribution to go numb and hurt.  Most daily activities cause the median nerve distribution to cause a burning and  tingling pain.  She has done injections in the carpal tunnel. She has done bracing and activity modifications and these have not provided relief. She is interested in cubital tunnel and carpal tunnel releases.   Past Medical History:  Diagnosis Date  . Abdominal pain    CHRONIC, SEES DR Melvia Heaps AND AMY [facility] PA  . ADD (attention deficit disorder)   . Anemia   . Anxiety   . Chronic back pain    LOW BACK PAIN  . COPD (chronic obstructive pulmonary disease) (HCC)   . Depression   . Generalized headaches      Past Surgical History:  Procedure Laterality Date  . ABDOMINAL HYSTERECTOMY    . COLONOSCOPY  11-15-2009   DR Arlyce Dice, CLEAR EXCEPT FOR SOME HEMORRHOIDS  . HEMORRHOID SURGERY  2004   [facility], DR. Luisa Hart  . LAPAROSCOPIC ASSISTED VAGINAL HYSTERECTOMY  02-27-2010   WITH A&P REPAIR, DR. Jarold Song     Family History  Problem Relation Age of Onset  . Colon polyps Mother   . Heart disease Mother   . Irritable bowel syndrome Mother      Current Medications No current facility-administered medications for this encounter.   Marland Kitchen azithromycin (ZITHROMAX) 250 MG tablet  . benzonatate (TESSALON) 100 MG capsule  . fluticasone (FLONASE) 50 MCG/ACT nasal spray  . guaiFENesin-codeine 100-10 MG/5ML syrup  . nicotine (NICODERM CQ - DOSED IN MG/24 HOURS) 14 mg/24hr patch  . predniSONE (DELTASONE) 50 MG tablet     Allergies Morphine, Omnicef [cefdinir], Penicillins, and  Propoxyphene n-acetaminophen  Social History  reports that she has been smoking. She has never used smokeless tobacco. She reports that she does not drink alcohol and does not use drugs.    Physical Exam General: Well appearing, in NAD Mental status: Alert and Oriented x3 Lungs: CTA b/l anterior and posterior without crackles or wheeze Heart: RRR, no m/g/r appreciated Abdomen: +BS, soft, nt, nd, no masses, hernias, or organomegaly appreciated Neurological: Speech Clear and organized, no gross focal findings or movement disorder appreciated Musculoskeletal: no gross joint deformity or swelling.  Observed gait normal Extremities: RUE: Postive Tinel's in her elbow. Positive Phalen's at the carpal tunnel. Some decreased sensation at baseline in her median nerve distribution. At baseline she is neurovascularly intact in the ulnar nerve.   Other ext:  Warm and well perfused w/o edema Skin: Warm and dry    ASSESSMENT/PLAN: I had a long talk with her about her treatment options. I discussed the risks and benefits of continued conservative management  and she is not interested in that. I did discuss the risks and benefits of cubital and carpal tunnel release. She would like to go forward with that. We would likely do both as a standard open release and simple decompression at the cubital tunnel.

## 2020-10-27 ENCOUNTER — Other Ambulatory Visit (HOSPITAL_COMMUNITY)
Admission: RE | Admit: 2020-10-27 | Discharge: 2020-10-27 | Disposition: A | Discharge status: Home / Self Care | Payer: 59 | Source: Ambulatory Visit | Attending: Orthopedic Surgery | Admitting: Orthopedic Surgery

## 2020-10-27 DIAGNOSIS — Z01812 Encounter for preprocedural laboratory examination: Secondary | ICD-10-CM | POA: Insufficient documentation

## 2020-10-27 DIAGNOSIS — Z20822 Contact with and (suspected) exposure to covid-19: Secondary | ICD-10-CM | POA: Insufficient documentation

## 2020-10-27 LAB — SARS CORONAVIRUS 2 (TAT 6-24 HRS): SARS Coronavirus 2: NEGATIVE

## 2020-10-30 ENCOUNTER — Encounter (HOSPITAL_BASED_OUTPATIENT_CLINIC_OR_DEPARTMENT_OTHER): Payer: Self-pay | Admitting: Orthopedic Surgery

## 2020-10-30 ENCOUNTER — Other Ambulatory Visit: Payer: Self-pay

## 2020-10-30 NOTE — Progress Notes (Signed)
Spoke w/ via phone for pre-op interview--- PT Lab needs dos----  no             Lab results------ no COVID test ------ done 10-27-2020 negative result in epic Arrive at ------- 0630 NPO after MN NO Solid Food.  Clear liquids from MN until--- 0530 Medications to take morning of surgery ----- NONE Diabetic medication ----- n/a Patient Special Instructions ----- n/a Pre-Op special Istructions ----- n/a Patient verbalized understanding of instructions that were given at this phone interview. Patient denies shortness of breath, chest pain, fever, cough at this phone interview.

## 2020-10-30 NOTE — Anesthesia Preprocedure Evaluation (Addendum)
Anesthesia Evaluation  Patient identified by MRN, date of birth, ID band Patient awake    Reviewed: Allergy & Precautions, NPO status , Patient's Chart, lab work & pertinent test results  History of Anesthesia Complications (+) PONV  Airway Mallampati: II  TM Distance: >3 FB Neck ROM: Full    Dental  (+) Upper Dentures, Lower Dentures, Dental Advisory Given   Pulmonary COPD, Current Smoker,    Pulmonary exam normal        Cardiovascular negative cardio ROS Normal cardiovascular exam     Neuro/Psych  Headaches, PSYCHIATRIC DISORDERS Anxiety Depression    GI/Hepatic negative GI ROS, Neg liver ROS, IBS   Endo/Other  negative endocrine ROS  Renal/GU negative Renal ROS     Musculoskeletal negative musculoskeletal ROS (+)   Abdominal   Peds  Hematology negative hematology ROS (+)   Anesthesia Other Findings   Reproductive/Obstetrics                            Anesthesia Physical Anesthesia Plan  ASA: II  Anesthesia Plan: General   Post-op Pain Management:    Induction: Intravenous  PONV Risk Score and Plan: Ondansetron and Dexamethasone  Airway Management Planned: LMA  Additional Equipment:   Intra-op Plan:   Post-operative Plan: Extubation in OR  Informed Consent: I have reviewed the patients History and Physical, chart, labs and discussed the procedure including the risks, benefits and alternatives for the proposed anesthesia with the patient or authorized representative who has indicated his/her understanding and acceptance.     Dental advisory given  Plan Discussed with: Anesthesiologist and CRNA  Anesthesia Plan Comments:        Anesthesia Quick Evaluation

## 2020-10-31 ENCOUNTER — Encounter (HOSPITAL_BASED_OUTPATIENT_CLINIC_OR_DEPARTMENT_OTHER): Payer: Self-pay | Admitting: Orthopedic Surgery

## 2020-10-31 ENCOUNTER — Encounter (HOSPITAL_BASED_OUTPATIENT_CLINIC_OR_DEPARTMENT_OTHER)
Admission: RE | Disposition: A | Discharge status: Home / Self Care | Payer: Self-pay | Source: Home / Self Care | Attending: Orthopedic Surgery

## 2020-10-31 ENCOUNTER — Ambulatory Visit (HOSPITAL_BASED_OUTPATIENT_CLINIC_OR_DEPARTMENT_OTHER)
Admission: RE | Admit: 2020-10-31 | Discharge: 2020-10-31 | Disposition: A | Discharge status: Home / Self Care | Payer: 59 | Attending: Orthopedic Surgery | Admitting: Orthopedic Surgery

## 2020-10-31 ENCOUNTER — Ambulatory Visit (HOSPITAL_BASED_OUTPATIENT_CLINIC_OR_DEPARTMENT_OTHER): Payer: 59 | Admitting: Anesthesiology

## 2020-10-31 DIAGNOSIS — Z8249 Family history of ischemic heart disease and other diseases of the circulatory system: Secondary | ICD-10-CM | POA: Insufficient documentation

## 2020-10-31 DIAGNOSIS — Z8379 Family history of other diseases of the digestive system: Secondary | ICD-10-CM | POA: Insufficient documentation

## 2020-10-31 DIAGNOSIS — Z881 Allergy status to other antibiotic agents status: Secondary | ICD-10-CM | POA: Diagnosis not present

## 2020-10-31 DIAGNOSIS — G5621 Lesion of ulnar nerve, right upper limb: Secondary | ICD-10-CM | POA: Diagnosis not present

## 2020-10-31 DIAGNOSIS — Z7952 Long term (current) use of systemic steroids: Secondary | ICD-10-CM | POA: Insufficient documentation

## 2020-10-31 DIAGNOSIS — F17200 Nicotine dependence, unspecified, uncomplicated: Secondary | ICD-10-CM | POA: Diagnosis not present

## 2020-10-31 DIAGNOSIS — Z885 Allergy status to narcotic agent status: Secondary | ICD-10-CM | POA: Insufficient documentation

## 2020-10-31 DIAGNOSIS — Z880 Allergy status to penicillin: Secondary | ICD-10-CM | POA: Insufficient documentation

## 2020-10-31 DIAGNOSIS — J449 Chronic obstructive pulmonary disease, unspecified: Secondary | ICD-10-CM | POA: Insufficient documentation

## 2020-10-31 DIAGNOSIS — G5601 Carpal tunnel syndrome, right upper limb: Secondary | ICD-10-CM | POA: Diagnosis present

## 2020-10-31 HISTORY — DX: Family history of other specified conditions: Z84.89

## 2020-10-31 HISTORY — PX: ULNAR TUNNEL RELEASE: SHX820

## 2020-10-31 HISTORY — DX: Other specified postprocedural states: R11.2

## 2020-10-31 HISTORY — DX: Generalized anxiety disorder: F41.1

## 2020-10-31 HISTORY — DX: Complete loss of teeth, unspecified cause, unspecified class: K08.109

## 2020-10-31 HISTORY — DX: Major depressive disorder, single episode, unspecified: F32.9

## 2020-10-31 HISTORY — DX: Nausea with vomiting, unspecified: Z98.890

## 2020-10-31 HISTORY — PX: CARPAL TUNNEL RELEASE: SHX101

## 2020-10-31 HISTORY — DX: Presence of dental prosthetic device (complete) (partial): Z97.2

## 2020-10-31 SURGERY — CARPAL TUNNEL RELEASE
Anesthesia: General | Site: Wrist | Laterality: Right

## 2020-10-31 MED ORDER — PROPOFOL 10 MG/ML IV BOLUS
INTRAVENOUS | Status: DC | PRN
  Administered 2020-10-31: 200 mg via INTRAVENOUS
  Administered 2020-10-31: 100 mg via INTRAVENOUS

## 2020-10-31 MED ORDER — MIDAZOLAM HCL 5 MG/5ML IJ SOLN
INTRAMUSCULAR | Status: DC | PRN
  Administered 2020-10-31: 2 mg via INTRAVENOUS

## 2020-10-31 MED ORDER — BUPIVACAINE HCL (PF) 0.25 % IJ SOLN
INTRAMUSCULAR | Status: DC | PRN
  Administered 2020-10-31: 6 mL

## 2020-10-31 MED ORDER — CELECOXIB 200 MG PO CAPS: 200.0000 mg | ORAL_CAPSULE | Freq: Two times a day (BID) | ORAL | Status: DC

## 2020-10-31 MED ORDER — SCOPOLAMINE 1 MG/3DAYS TD PT72
MEDICATED_PATCH | TRANSDERMAL | Status: DC | PRN
  Administered 2020-10-31: 1 via TRANSDERMAL

## 2020-10-31 MED ORDER — KETOROLAC TROMETHAMINE 30 MG/ML IJ SOLN
INTRAMUSCULAR | Status: AC
  Filled 2020-10-31: qty 1

## 2020-10-31 MED ORDER — HYDROCODONE-ACETAMINOPHEN 5-325 MG PO TABS: 1.0000 | ORAL_TABLET | ORAL | Status: DC | PRN

## 2020-10-31 MED ORDER — MORPHINE SULFATE (PF) 4 MG/ML IV SOLN: 0.5000 mg | INTRAVENOUS | Status: DC | PRN

## 2020-10-31 MED ORDER — ACETAMINOPHEN 500 MG PO TABS
ORAL_TABLET | ORAL | Status: AC
  Filled 2020-10-31: qty 2

## 2020-10-31 MED ORDER — ONDANSETRON HCL 4 MG/2ML IJ SOLN: 4.0000 mg | Freq: Four times a day (QID) | INTRAMUSCULAR | Status: DC | PRN

## 2020-10-31 MED ORDER — ONDANSETRON HCL 4 MG/2ML IJ SOLN
INTRAMUSCULAR | Status: AC
  Filled 2020-10-31: qty 2

## 2020-10-31 MED ORDER — MIDAZOLAM HCL 2 MG/2ML IJ SOLN
INTRAMUSCULAR | Status: AC
  Filled 2020-10-31: qty 2

## 2020-10-31 MED ORDER — CELECOXIB 200 MG PO CAPS
200.0000 mg | ORAL_CAPSULE | Freq: Once | ORAL | Status: AC
  Administered 2020-10-31: 200 mg via ORAL

## 2020-10-31 MED ORDER — ACETAMINOPHEN 325 MG PO TABS: 325.0000 mg | ORAL_TABLET | Freq: Four times a day (QID) | ORAL | Status: DC | PRN

## 2020-10-31 MED ORDER — HYDROCODONE-ACETAMINOPHEN 7.5-325 MG PO TABS: 1.0000 | ORAL_TABLET | ORAL | Status: DC | PRN

## 2020-10-31 MED ORDER — PROPOFOL 500 MG/50ML IV EMUL
INTRAVENOUS | Status: AC
  Filled 2020-10-31: qty 50

## 2020-10-31 MED ORDER — FENTANYL CITRATE (PF) 100 MCG/2ML IJ SOLN
INTRAMUSCULAR | Status: DC | PRN
  Administered 2020-10-31 (×2): 25 ug via INTRAVENOUS
  Administered 2020-10-31: 50 ug via INTRAVENOUS
  Administered 2020-10-31: 25 ug via INTRAVENOUS
  Administered 2020-10-31 (×2): 50 ug via INTRAVENOUS
  Administered 2020-10-31 (×3): 25 ug via INTRAVENOUS

## 2020-10-31 MED ORDER — ONDANSETRON HCL 4 MG PO TABS: 4.0000 mg | ORAL_TABLET | Freq: Four times a day (QID) | ORAL | Status: DC | PRN

## 2020-10-31 MED ORDER — FENTANYL CITRATE (PF) 100 MCG/2ML IJ SOLN
INTRAMUSCULAR | Status: AC
  Filled 2020-10-31: qty 2

## 2020-10-31 MED ORDER — LACTATED RINGERS IV SOLN: INTRAVENOUS | Status: DC

## 2020-10-31 MED ORDER — METOCLOPRAMIDE HCL 5 MG/ML IJ SOLN: 5.0000 mg | Freq: Three times a day (TID) | INTRAMUSCULAR | Status: DC | PRN

## 2020-10-31 MED ORDER — DEXAMETHASONE SODIUM PHOSPHATE 4 MG/ML IJ SOLN
INTRAMUSCULAR | Status: DC | PRN
  Administered 2020-10-31: 10 mg via INTRAVENOUS

## 2020-10-31 MED ORDER — HYDROCODONE-ACETAMINOPHEN 5-325 MG PO TABS: 1.0000 | ORAL_TABLET | ORAL | 0 refills | Status: DC | PRN

## 2020-10-31 MED ORDER — ONDANSETRON HCL 4 MG/2ML IJ SOLN
INTRAMUSCULAR | Status: DC | PRN
  Administered 2020-10-31 (×2): 4 mg via INTRAVENOUS

## 2020-10-31 MED ORDER — AMISULPRIDE (ANTIEMETIC) 5 MG/2ML IV SOLN: 10.0000 mg | Freq: Once | INTRAVENOUS | Status: DC | PRN

## 2020-10-31 MED ORDER — ACETAMINOPHEN 500 MG PO TABS
1000.0000 mg | ORAL_TABLET | Freq: Once | ORAL | Status: AC
  Administered 2020-10-31: 1000 mg via ORAL

## 2020-10-31 MED ORDER — OXYCODONE HCL 5 MG PO TABS
ORAL_TABLET | ORAL | Status: AC
  Filled 2020-10-31: qty 1

## 2020-10-31 MED ORDER — CELECOXIB 200 MG PO CAPS
ORAL_CAPSULE | ORAL | Status: AC
  Filled 2020-10-31: qty 1

## 2020-10-31 MED ORDER — KETOROLAC TROMETHAMINE 30 MG/ML IJ SOLN
INTRAMUSCULAR | Status: DC | PRN
  Administered 2020-10-31: 30 mg via INTRAVENOUS

## 2020-10-31 MED ORDER — VANCOMYCIN HCL IN DEXTROSE 1-5 GM/200ML-% IV SOLN
INTRAVENOUS | Status: AC
  Filled 2020-10-31: qty 200

## 2020-10-31 MED ORDER — METOCLOPRAMIDE HCL 5 MG PO TABS
5.0000 mg | ORAL_TABLET | Freq: Three times a day (TID) | ORAL | Status: DC | PRN
  Filled 2020-10-31: qty 2

## 2020-10-31 MED ORDER — LIDOCAINE HCL (PF) 2 % IJ SOLN
INTRAMUSCULAR | Status: AC
  Filled 2020-10-31: qty 5

## 2020-10-31 MED ORDER — LIDOCAINE HCL (CARDIAC) PF 100 MG/5ML IV SOSY
PREFILLED_SYRINGE | INTRAVENOUS | Status: DC | PRN
  Administered 2020-10-31: 40 mg via INTRAVENOUS
  Administered 2020-10-31: 60 mg via INTRAVENOUS

## 2020-10-31 MED ORDER — FENTANYL CITRATE (PF) 100 MCG/2ML IJ SOLN
25.0000 ug | INTRAMUSCULAR | Status: DC | PRN
  Administered 2020-10-31: 25 ug via INTRAVENOUS
  Administered 2020-10-31: 50 ug via INTRAVENOUS
  Administered 2020-10-31: 25 ug via INTRAVENOUS

## 2020-10-31 MED ORDER — SENNOSIDES-DOCUSATE SODIUM 8.6-50 MG PO TABS
1.0000 | ORAL_TABLET | Freq: Every evening | ORAL | Status: DC | PRN
  Filled 2020-10-31: qty 1

## 2020-10-31 MED ORDER — VANCOMYCIN HCL IN DEXTROSE 1-5 GM/200ML-% IV SOLN
1000.0000 mg | INTRAVENOUS | Status: AC
  Administered 2020-10-31: 1000 mg via INTRAVENOUS

## 2020-10-31 MED ORDER — OXYCODONE HCL 5 MG PO TABS
5.0000 mg | ORAL_TABLET | Freq: Once | ORAL | Status: AC
  Administered 2020-10-31: 5 mg via ORAL

## 2020-10-31 MED ORDER — SCOPOLAMINE 1 MG/3DAYS TD PT72
MEDICATED_PATCH | TRANSDERMAL | Status: AC
  Filled 2020-10-31: qty 1

## 2020-10-31 MED ORDER — DEXAMETHASONE SODIUM PHOSPHATE 10 MG/ML IJ SOLN
INTRAMUSCULAR | Status: AC
  Filled 2020-10-31: qty 1

## 2020-10-31 SURGICAL SUPPLY — 44 items
APL PRP STRL LF DISP 70% ISPRP (MISCELLANEOUS) ×2
BLADE SURG 15 STRL LF DISP TIS (BLADE) ×2 IMPLANT
BLADE SURG 15 STRL SS (BLADE) ×6
BNDG CMPR 9X4 STRL LF SNTH (GAUZE/BANDAGES/DRESSINGS) ×2
BNDG ELASTIC 4X5.8 VLCR STR LF (GAUZE/BANDAGES/DRESSINGS) ×1 IMPLANT
BNDG ESMARK 4X9 LF (GAUZE/BANDAGES/DRESSINGS) ×1 IMPLANT
BNDG GAUZE ELAST 4 BULKY (GAUZE/BANDAGES/DRESSINGS) ×1 IMPLANT
CHLORAPREP W/TINT 26 (MISCELLANEOUS) ×3 IMPLANT
CORD BIPOLAR FORCEPS 12FT (ELECTRODE) ×1 IMPLANT
COVER BACK TABLE 60X90IN (DRAPES) ×3 IMPLANT
COVER WAND RF STERILE (DRAPES) ×3 IMPLANT
CUFF TOURN SGL QUICK 18X4 (TOURNIQUET CUFF) ×1 IMPLANT
DRAPE EXTREMITY T 121X128X90 (DISPOSABLE) ×3 IMPLANT
DRAPE IMP U-DRAPE 54X76 (DRAPES) ×3 IMPLANT
DRAPE SURG 17X23 STRL (DRAPES) ×3 IMPLANT
DRSG EMULSION OIL 3X3 NADH (GAUZE/BANDAGES/DRESSINGS) ×3 IMPLANT
DRSG TEGADERM 2-3/8X2-3/4 SM (GAUZE/BANDAGES/DRESSINGS) ×3 IMPLANT
DRSG TEGADERM 4X4.75 (GAUZE/BANDAGES/DRESSINGS) ×1 IMPLANT
GAUZE SPONGE 4X4 12PLY STRL (GAUZE/BANDAGES/DRESSINGS) ×3 IMPLANT
GLOVE BIO SURGEON STRL SZ7.5 (GLOVE) ×6 IMPLANT
GLOVE BIO SURGEON STRL SZ8 (GLOVE) ×1 IMPLANT
GLOVE SRG 8 PF TXTR STRL LF DI (GLOVE) ×4 IMPLANT
GLOVE SURG UNDER POLY LF SZ7 (GLOVE) ×3 IMPLANT
GLOVE SURG UNDER POLY LF SZ8 (GLOVE) ×6
GOWN STRL REUS W/ TWL LRG LVL3 (GOWN DISPOSABLE) ×2 IMPLANT
GOWN STRL REUS W/ TWL XL LVL3 (GOWN DISPOSABLE) ×4 IMPLANT
GOWN STRL REUS W/TWL LRG LVL3 (GOWN DISPOSABLE) ×3
GOWN STRL REUS W/TWL XL LVL3 (GOWN DISPOSABLE) ×6
KIT TURNOVER CYSTO (KITS) ×3 IMPLANT
NDL HYPO 25X1 1.5 SAFETY (NEEDLE) IMPLANT
NEEDLE HYPO 25X1 1.5 SAFETY (NEEDLE) ×3 IMPLANT
NS IRRIG 1000ML POUR BTL (IV SOLUTION) ×3 IMPLANT
PACK BASIN DAY SURGERY FS (CUSTOM PROCEDURE TRAY) ×3 IMPLANT
SLING ARM FOAM STRAP MED (SOFTGOODS) ×1 IMPLANT
SOL ANTI FOG 6CC (MISCELLANEOUS) ×2 IMPLANT
SOLUTION ANTI FOG 6CC (MISCELLANEOUS) ×1
SUT ETHILON 3 0 PS 1 (SUTURE) ×3 IMPLANT
SUT MON AB 3-0 SH 27 (SUTURE) ×3
SUT MON AB 3-0 SH27 (SUTURE) IMPLANT
SUT VIC AB 2-0 SH 27 (SUTURE) ×3
SUT VIC AB 2-0 SH 27XBRD (SUTURE) IMPLANT
SYR BULB EAR ULCER 3OZ GRN STR (SYRINGE) ×1 IMPLANT
SYR CONTROL 10ML LL (SYRINGE) ×3 IMPLANT
UNDERPAD 30X36 HEAVY ABSORB (UNDERPADS AND DIAPERS) ×3 IMPLANT

## 2020-10-31 NOTE — Interval H&P Note (Signed)
History and Physical Interval Note:  10/31/2020 7:13 AM  [name]  has presented today for surgery, with the diagnosis of RIGHT CARPAL TUNNEL AND ULNAR NERVE LESION.  The various methods of treatment have been discussed with the patient and family. After consideration of risks, benefits and other options for treatment, the patient has consented to  Procedure(s): CARPAL TUNNEL RELEASE (Right) as a surgical intervention.  The patient's history has been reviewed, patient examined, no change in status, stable for surgery.  I have reviewed the patient's chart and labs.  Questions were answered to the patient's satisfaction.     Sheral Apley

## 2020-10-31 NOTE — Anesthesia Procedure Notes (Signed)
Procedure Name: LMA Insertion Date/Time: 10/31/2020 8:56 AM Performed by: Jessica Priest, CRNA Pre-anesthesia Checklist: Patient identified, Emergency Drugs available, Suction available, Patient being monitored and Timeout performed Patient Re-evaluated:Patient Re-evaluated prior to induction Oxygen Delivery Method: Circle system utilized Preoxygenation: Pre-oxygenation with 100% oxygen Induction Type: IV induction Ventilation: Mask ventilation without difficulty LMA: LMA inserted LMA Size: 4.0 Number of attempts: 1 Airway Equipment and Method: Bite block Placement Confirmation: positive ETCO2,  breath sounds checked- equal and bilateral and CO2 detector Tube secured with: Tape Dental Injury: Teeth and Oropharynx as per pre-operative assessment

## 2020-10-31 NOTE — Anesthesia Postprocedure Evaluation (Signed)
Anesthesia Post Note  Patient: [name]  Procedure(s) Performed: CARPAL TUNNEL RELEASE (Right Wrist) CUBITAL TUNNEL RELEASE (Right )     Patient location during evaluation: PACU Anesthesia Type: General Level of consciousness: sedated Pain management: pain level controlled Vital Signs Assessment: post-procedure vital signs reviewed and stable Respiratory status: spontaneous breathing and respiratory function stable Cardiovascular status: stable Postop Assessment: no apparent nausea or vomiting Anesthetic complications: no   No complications documented.  Last Vitals:  Vitals:   10/31/20 1115 10/31/20 1200  BP: 121/85 (!) 128/99  Pulse: 66 72  Resp: 13 16  Temp:  36.6 C  SpO2: 90% 100%    Last Pain:  Vitals:   10/31/20 1200  TempSrc:   PainSc: 6                  [name]

## 2020-10-31 NOTE — Transfer of Care (Signed)
Immediate Anesthesia Transfer of Care Note  Patient: [name]  Procedure(s) Performed: Procedure(s) (LRB): CARPAL TUNNEL RELEASE (Right) CUBITAL TUNNEL RELEASE (Right)  Patient Location: PACU  Anesthesia Type: General  Level of Consciousness: awake, sedated, patient cooperative and responds to stimulation  Airway & Oxygen Therapy: Patient Spontanous Breathing and Patient connected to [place] 02 and soft FM   Post-op Assessment: Report given to PACU RN, Post -op Vital signs reviewed and stable and Patient moving all extremities  Post vital signs: Reviewed and stable  Complications: No apparent anesthesia complications

## 2020-10-31 NOTE — Discharge Instructions (Signed)
Keep wrist elevated with ice as much as possible to reduce pain and swelling.  Diet: As you were doing prior to hospitalization   Shower / Dressing:  Leave dressing in place and dry for 3 days.  You may then remove dressings and shower over incision.  No bath or submerging incision.  Place clean Band-Aid over incision.  Activity:  Increase activity slowly as tolerated, but follow the weight bearing instructions below.  The rules on driving is that you can not be taking narcotics while you drive, and you must feel in control of the vehicle. Elevate Right arm as much a possible. Please keep arm in sling when up.   Weight Bearing:  Non weight bearing affected wrist. And elbow  To prevent constipation: you may use a stool softener such as -  Colace (over the counter) 100 mg by mouth twice a day  Drink plenty of fluids (prune juice may be helpful) and high fiber foods Miralax (over the counter) for constipation as needed.    Itching:  If you experience itching with your medications, try taking only a single pain pill, or even half a pain pill at a time.  You can also use benadryl over the counter for itching or also to help with sleep.   Precautions:  If you experience chest pain or shortness of breath - call 911 immediately for transfer to the hospital emergency department!!  If you develop a fever greater that 101 F, purulent drainage from wound, increased redness or drainage from wound, or calf pain -- Call the office at [phone]                                         Follow- Up Appointment:  Please call for an appointment to be seen in 2 weeks [place] - [phone]  NO ADVIL ALEVE MOTRIN IBUPROFEN UNTIL 3:30 PM TODAY        HAND SURGERY    HOME CARE INSTRUCTIONS    The following instructions have been prepared to help you care for yourself upon your return home today.  Wound Care:  Keep your hand elevated above the level of your heart. Do not allow it to dangle by your  side. Keep the dressing dry and do not remove it unless your doctor advises you to do so. He will usually change it at the time of you post-op visit. Moving your fingers is advised to stimulate circulation but will depend on the site of your surgery. Of course, if you have a splint applied your doctor will advise you about movement.  Activity:  Do not drive or operate machinery today. Rest today and then you may return to your normal activity and work as indicated by your physician.  Diet: Drink liquids today or eat a light diet. You may resume a regular diet tomorrow.  General expectations: Pain for two or three days. Fingers may become slightly swollen.   Unexpected Observations- Call your doctor if any of these occur: Severe pain not relieved by pain medication. Elevated temperature. Dressing soaked with blood. Inability to move fingers. White or bluish color to fingers.      Post Anesthesia Home Care Instructions  Activity: Get plenty of rest for the remainder of the day. A responsible adult should stay with you for 24 hours following the procedure.  For the next 24 hours, DO NOT: -Drive a car -  Operate machinery -Drink alcoholic beverages -Take any medication unless instructed by your physician -Make any legal decisions or sign important papers.  Meals: Start with liquid foods such as gelatin or soup. Progress to regular foods as tolerated. Avoid greasy, spicy, heavy foods. If nausea and/or vomiting occur, drink only clear liquids until the nausea and/or vomiting subsides. Call your physician if vomiting continues.  Special Instructions/Symptoms: Your throat may feel dry or sore from the anesthesia or the breathing tube placed in your throat during surgery. If this causes discomfort, gargle with warm salt water. The discomfort should disappear within 24 hours.  If you had a scopolamine patch placed behind your ear for the management of post- operative nausea and/or  vomiting:  1. The medication in the patch is effective for 72 hours, after which it should be removed.  Wrap patch in a tissue and discard in the trash. Wash hands thoroughly with soap and water. 2. You may remove the patch earlier than 72 hours if you experience unpleasant side effects which may include dry mouth, dizziness or visual disturbances. 3. Avoid touching the patch. Wash your hands with soap and water after contact with the patch.

## 2020-11-01 ENCOUNTER — Encounter (HOSPITAL_BASED_OUTPATIENT_CLINIC_OR_DEPARTMENT_OTHER): Payer: Self-pay | Admitting: Orthopedic Surgery

## 2020-11-01 NOTE — Op Note (Signed)
10/31/2020  7:21 AM  PATIENT:  [name]    PRE-OPERATIVE DIAGNOSIS:  RIGHT CARPAL TUNNEL AND ULNAR NERVE LESION  POST-OPERATIVE DIAGNOSIS:  Same  PROCEDURE:  CARPAL TUNNEL RELEASE, CUBITAL TUNNEL RELEASE  SURGEON:  Sheral Apley, MD  ASSISTANT: Daun Peacock, PA-C, he was present and scrubbed throughout the case, critical for completion in a timely fashion, and for retraction, instrumentation, and closure.   ANESTHESIA:   gen  PREOPERATIVE INDICATIONS:  [name] is a  45 y.o. female with a diagnosis of RIGHT CARPAL TUNNEL AND ULNAR NERVE LESION who failed conservative measures and elected for surgical management.    The risks benefits and alternatives were discussed with the patient preoperatively including but not limited to the risks of infection, bleeding, nerve injury, cardiopulmonary complications, the need for revision surgery, among others, and the patient was willing to proceed.  OPERATIVE IMPLANTS: none  OPERATIVE FINDINGS: carpal tunnel and cubital tunnel release  BLOOD LOSS: min  COMPLICATIONS: none  TOURNIQUET TIME:  OPERATIVE PROCEDURE:  Patient was identified in the preoperative holding area and site was marked by me She was transported to the operating theater and placed on the table in supine position taking care to pad all bony prominences. After a preincinduction time out anesthesia was induced. The right upper extremity was prepped and draped in normal sterile fashion and a pre-incision timeout was performed. She received vanc for preoperative antibiotics.   I started at her carpal tunnel and made a longitudinal incision through a palmar crease. I dissected down to her transverse carpal fascia maintaining hemostasis.  I then made a small nick and this identified her median nerve and protected this while I incised the transverse carpal fascia and extended this proximally beneath the incision as well with a Freer protecting the  nerve.  Identified the nerve and there was no harm to this during the case. I then thoroughly irrigated and closed with nylon stitches.  Next I turned my attention to her cubital tunnel where I made a longitudinal incision centered over her cubital tunnel and maintained hemostasis  I dissected down to the cubital tunnel identified her ulnar nerve and protected this throughout the case I freed this up proximally as well as distally and through the cubital tunnel once it was freely mobilized I confirmed no compression at any proximal or distal plate and then thoroughly irrigated and closed her skin and subcu tissue. Sterile dressings were applied to both  POST OPERATIVE PLAN: mobilize for dvt px. Sling for 1wk but extend elbow

## 2021-01-22 ENCOUNTER — Other Ambulatory Visit: Payer: Self-pay

## 2021-01-22 ENCOUNTER — Encounter: Payer: Self-pay | Admitting: Emergency Medicine

## 2021-01-22 ENCOUNTER — Ambulatory Visit
Admission: EM | Admit: 2021-01-22 | Discharge: 2021-01-22 | Disposition: A | Discharge status: Home / Self Care | Payer: 59 | Attending: Emergency Medicine | Admitting: Emergency Medicine

## 2021-01-22 DIAGNOSIS — G43109 Migraine with aura, not intractable, without status migrainosus: Secondary | ICD-10-CM | POA: Diagnosis not present

## 2021-01-22 DIAGNOSIS — B370 Candidal stomatitis: Secondary | ICD-10-CM | POA: Diagnosis not present

## 2021-01-22 DIAGNOSIS — J309 Allergic rhinitis, unspecified: Secondary | ICD-10-CM

## 2021-01-22 MED ORDER — KETOROLAC TROMETHAMINE 60 MG/2ML IM SOLN
60.0000 mg | Freq: Once | INTRAMUSCULAR | Status: AC
  Administered 2021-01-22: 60 mg via INTRAMUSCULAR

## 2021-01-22 MED ORDER — NYSTATIN 100000 UNIT/ML MT SUSP: 500000.0000 [IU] | Freq: Four times a day (QID) | OROMUCOSAL | 0 refills | Status: DC

## 2021-01-22 MED ORDER — FLUTICASONE PROPIONATE 50 MCG/ACT NA SUSP: 2.0000 | Freq: Every day | NASAL | 0 refills | Status: DC

## 2021-01-22 MED ORDER — RIZATRIPTAN BENZOATE 5 MG PO TABS: 5.0000 mg | ORAL_TABLET | ORAL | 0 refills | Status: DC | PRN

## 2021-01-22 NOTE — ED Provider Notes (Signed)
[name]    CSN: [id number] Arrival date & time: 01/22/21  1120      History   Chief Complaint Chief Complaint  Patient presents with  . Nasal Congestion  . Thrush  . Headache    HPI [name] is a 45 y.o. female.   Patient presents with concerns of migraine headache for the past 2 days. She has had sinus congestion and pressure with pressure in the right ear and occasional cough for the past few days. She states the congestion has been causing a headache that turned into a migraine. She reports the pain is mainly in her forehead and in the back of her head into the neck. She has some mild nausea but denies vomiting. The patient has sensitivity to light and sound and reports some mild lightheadedness. She has tried 800mg  ibuprofen with minimal improvement.  She has history of migraines that felt similar and improved with Maxalt. She does not have any more Maxalt and is waiting to get in with a new PCP since she just recently got insurance. She has been taking Claritin for her allergies with minimal improvement. She denies sore throat, fever, or feeling unwell otherwise and states she doesn't feel "sick".  The patient also reports concern of thrush. She wears dentures and the past week or so has noticed white patches on her tongue, cheeks, and the corners of her mouth. She states it is uncomfortable and burns and she has to scrub her tongue to get the white patches off. She reports prior similar she was told was thrush and improved with a prescription mouthwash.   The history is provided by the patient.  Headache Pain location:  Frontal and occipital Context: bright light and loud noise   Ineffective treatments:  NSAIDs Associated symptoms: congestion, cough, ear pain, facial pain, nausea, neck pain, photophobia and sinus pressure   Associated symptoms: no abdominal pain, no blurred vision, no diarrhea, no dizziness, no eye pain, no fatigue, no fever, no myalgias, no  numbness, no sore throat, no syncope and no vomiting     Past Medical History:  Diagnosis Date  . ADD (attention deficit disorder)   . Chronic back pain    LOW BACK PAIN  . Chronic back pain   . COPD (chronic obstructive pulmonary disease) (HCC)    per pt last exacerbation w/ pneumonia 12/ 2021 , no covid,  stated no residual  . Family history of adverse reaction to anesthesia    sister--- ponv/ migraines  . Full dentures   . GAD (generalized anxiety disorder)   . Generalized headaches   . MDD (major depressive disorder)   . PONV (postoperative nausea and vomiting)    and migraines    Patient Active Problem List   Diagnosis Date Noted  . MENOPAUSE, SURGICAL 07/26/2010  . NAUSEA AND VOMITING 07/16/2010  . IRRITABLE BOWEL SYNDROME 06/15/2010  . NAUSEA 06/15/2010  . Flatulence, eructation, and gas pain 06/15/2010  . RECTAL PROLAPSE 04/30/2010  . LOW BACK PAIN SYNDROME 11/24/2009  . RECTAL BLEEDING 10/26/2009  . DIARRHEA 10/26/2009  . ABDOMINAL PAIN-LLQ 10/26/2009  . ABNORMAL FINDINGS GI TRACT 10/26/2009  . TEMPOROMANDIBULAR JOINT PAIN 05/26/2009  . NECK SPRAIN AND STRAIN 11/15/2008  . ABDOMINAL PAIN, GENERALIZED 02/24/2008  . OTHER ASCITES 02/24/2008  . ANXIETY 08/14/2007  . BRONCHITIS, ACUTE 07/27/2007  . ANEMIA-NOS 04/06/2007  . DEPRESSION 04/06/2007  . ADD 04/06/2007  . UTI'S, CHRONIC 04/06/2007  . HEADACHE 04/06/2007  . IRRITABLE BOWEL SYNDROME,  HX OF 04/06/2007    Past Surgical History:  Procedure Laterality Date  . CARPAL TUNNEL RELEASE Right 10/31/2020   Procedure: CARPAL TUNNEL RELEASE;  Surgeon: Sheral Apley, MD;  Location: [facility];  Service: Orthopedics;  Laterality: Right;  . COLONOSCOPY  11-15-2009   DR Arlyce Dice, CLEAR EXCEPT FOR SOME HEMORRHOIDS  . HEMORRHOID SURGERY  02/28/2006   [facility], DR. Luisa Hart  . KNEE ARTHROSCOPY W/ MENISCECTOMY Right 2005  . LAPAROSCOPIC ASSISTED VAGINAL HYSTERECTOMY  02-27-2010  @  [facility]   WITH A&P REPAIR,  DR. Jarold Song  . ULNAR TUNNEL RELEASE Right 10/31/2020   Procedure: CUBITAL TUNNEL RELEASE;  Surgeon: Sheral Apley, MD;  Location: [facility];  Service: Orthopedics;  Laterality: Right;    OB History   No obstetric history on file.      Home Medications    Prior to Admission medications   Medication Sig Start Date End Date Taking? Authorizing Provider  fluticasone (FLONASE) 50 MCG/ACT nasal spray Place 2 sprays into both nostrils daily. 01/22/21  Yes Charell Faulk L, PA  nystatin (MYCOSTATIN) 100000 UNIT/ML suspension Take 5 mLs (500,000 Units total) by mouth 4 (four) times daily. 01/22/21  Yes Enma Maeda L, PA  rizatriptan (MAXALT) 5 MG tablet Take 1 tablet (5 mg total) by mouth as needed for migraine. May repeat in 2 hours if needed 01/22/21  Yes Farrell Broerman L, PA  Cyanocobalamin (B-12 PO) Take by mouth daily.    [provider]  HYDROcodone-acetaminophen (NORCO/VICODIN) 5-325 MG tablet Take 1 tablet by mouth every 4 (four) hours as needed for moderate pain. 10/31/20 10/31/21  Rainey Pines, PA-C  Multiple Vitamins-Minerals (ONE-A-DAY WOMENS PO) Take by mouth daily.    [provider]  nicotine (NICODERM CQ - DOSED IN MG/24 HOURS) 14 mg/24hr patch Place 1 patch (14 mg total) onto the skin daily. Patient not taking: Reported on 10/30/2020 09/28/20   Wieters, Fran Lowes C, PA-C  Omega-3 Fatty Acids (FISH OIL PO) Take by mouth daily.    [provider]  cetirizine (ZYRTEC ALLERGY) 10 MG tablet Take 1 tablet (10 mg total) by mouth daily. 09/04/20 09/28/20  Hall-Potvin, [place], PA-C    Family History Family History  Problem Relation Age of Onset  . Colon polyps Mother   . Heart disease Mother   . Irritable bowel syndrome Mother     Social History Social History   Tobacco Use  . Smoking status: Current Every Day Smoker    Packs/day: 1.00    Types: Cigarettes  . Smokeless tobacco: Never Used  Vaping Use  . Vaping Use: Never used   Substance Use Topics  . Alcohol use: Yes    Comment: occasional  . Drug use: Never     Allergies   Morphine, Omnicef [cefdinir], and Penicillins   Review of Systems Review of Systems  Constitutional: Negative for fatigue and fever.  HENT: Positive for congestion, ear pain and sinus pressure. Negative for rhinorrhea and sore throat.   Eyes: Positive for photophobia. Negative for blurred vision, pain and visual disturbance.  Respiratory: Positive for cough. Negative for chest tightness and shortness of breath.   Cardiovascular: Negative for chest pain and syncope.  Gastrointestinal: Positive for nausea. Negative for abdominal pain, diarrhea and vomiting.  Musculoskeletal: Positive for neck pain. Negative for myalgias.  Skin: Negative for rash.  Neurological: Positive for light-headedness and headaches. Negative for dizziness, syncope and numbness.     Physical Exam Triage Vital Signs ED Triage Vitals  Enc Vitals Group     BP 01/22/21 1226 123/86     Pulse Rate 01/22/21 1226 89     Resp 01/22/21 1226 18     Temp 01/22/21 1226 97.9 F (36.6 C)     Temp Source 01/22/21 1226 Oral     SpO2 01/22/21 1226 98 %     Weight --      Height --      Head Circumference --      Peak Flow --      Pain Score 01/22/21 1227 8     Pain Loc --      Pain Edu? --      Excl. in GC? --    No data found.  Updated Vital Signs BP 123/86 (BP Location: Left Arm)   Pulse 89   Temp 97.9 F (36.6 C) (Oral)   Resp 18   SpO2 98%   Visual Acuity Right Eye Distance:   Left Eye Distance:   Bilateral Distance:    Right Eye Near:   Left Eye Near:    Bilateral Near:     Physical Exam Vitals and nursing note reviewed.  Constitutional:      General: She is not in acute distress. HENT:     Head: Normocephalic.     Right Ear: Tympanic membrane, ear canal and external ear normal.     Left Ear: Tympanic membrane, ear canal and external ear normal.     Nose: Congestion present. No rhinorrhea.      Mouth/Throat:     Mouth: Mucous membranes are moist.     Dentition: Has dentures.     Pharynx: Oropharynx is clear.     Tonsils: No tonsillar exudate.     Comments: A few white plaques to tongue and buccal mucosa. Bilateral angular cheilitis.  Eyes:     Extraocular Movements: Extraocular movements intact.     Conjunctiva/sclera: Conjunctivae normal.     Pupils: Pupils are equal, round, and reactive to light.  Cardiovascular:     Rate and Rhythm: Normal rate and regular rhythm.     Heart sounds: Normal heart sounds.  Pulmonary:     Effort: Pulmonary effort is normal.     Breath sounds: Normal breath sounds.  Musculoskeletal:     Cervical back: Normal range of motion.  Skin:    General: Skin is warm.     Findings: No rash.  Neurological:     General: No focal deficit present.     Mental Status: She is alert.     Coordination: Coordination normal.     Gait: Gait normal.  Psychiatric:        Mood and Affect: Mood normal.      UC Treatments / Results  Labs (all labs ordered are listed, but only abnormal results are displayed) Labs Reviewed - No data to display  EKG   Radiology No results found.  Procedures Procedures (including critical care time)  Medications Ordered in UC Medications  ketorolac (TORADOL) injection 60 mg (60 mg Intramuscular Given 01/22/21 1303)    Initial Impression / Assessment and Plan / UC Course  I have reviewed the triage vital signs and the nursing notes.  Pertinent labs & imaging results that were available during my care of the patient were reviewed by me and considered in my medical decision making (see chart for details).     Migraine likely due to allergic sinusitis. Toradol in clinic, Maxalt Rx sent in for pt until she can  get in with new PCP. Migraine consistent with past symptoms, no red flags. Nystatin for oral candidiasis due to dentures.  Final Clinical Impressions(s) / UC Diagnoses   Final diagnoses:  Migraine with  aura and without status migrainosus, not intractable  Oral candidiasis  Allergic sinusitis     Discharge Instructions     Toradol injection given today to help with migraine. Take the Maxalt as needed for recurrent migraines and follow up with PCP for chronic management.  Use Flonase along with your Claritin to help with allergies and congestion. Use nystatin mouthwash for thrush and make sure to thoroughly clean your dentures.     ED Prescriptions    Medication Sig Dispense Auth. Provider   fluticasone (FLONASE) 50 MCG/ACT nasal spray Place 2 sprays into both nostrils daily. 15.8 mL Genavie Boettger L, PA   nystatin (MYCOSTATIN) 100000 UNIT/ML suspension Take 5 mLs (500,000 Units total) by mouth 4 (four) times daily. 60 mL Mechille Varghese L, PA   rizatriptan (MAXALT) 5 MG tablet Take 1 tablet (5 mg total) by mouth as needed for migraine. May repeat in 2 hours if needed 10 tablet Vallery Sa, Cherolyn Behrle L, [place]     PDMP not reviewed this encounter.   Estanislado Pandy, [place] 01/22/21 1307

## 2021-01-22 NOTE — ED Triage Notes (Signed)
Pt here for HA and nasal congestion x 3 days; pt sts possible thrush with hx of same and having white patches on tongue

## 2021-01-22 NOTE — Discharge Instructions (Signed)
Toradol injection given today to help with migraine. Take the Maxalt as needed for recurrent migraines and follow up with PCP for chronic management.  Use Flonase along with your Claritin to help with allergies and congestion. Use nystatin mouthwash for thrush and make sure to thoroughly clean your dentures.

## 2021-03-09 ENCOUNTER — Emergency Department (HOSPITAL_BASED_OUTPATIENT_CLINIC_OR_DEPARTMENT_OTHER)
Admission: EM | Admit: 2021-03-09 | Discharge: 2021-03-09 | Disposition: A | Discharge status: Home / Self Care | Payer: 59 | Attending: Emergency Medicine | Admitting: Emergency Medicine

## 2021-03-09 ENCOUNTER — Encounter (HOSPITAL_BASED_OUTPATIENT_CLINIC_OR_DEPARTMENT_OTHER): Payer: Self-pay | Admitting: *Deleted

## 2021-03-09 ENCOUNTER — Emergency Department (HOSPITAL_BASED_OUTPATIENT_CLINIC_OR_DEPARTMENT_OTHER): Payer: 59

## 2021-03-09 ENCOUNTER — Other Ambulatory Visit: Payer: Self-pay

## 2021-03-09 DIAGNOSIS — F17210 Nicotine dependence, cigarettes, uncomplicated: Secondary | ICD-10-CM | POA: Diagnosis not present

## 2021-03-09 DIAGNOSIS — R1032 Left lower quadrant pain: Secondary | ICD-10-CM | POA: Diagnosis present

## 2021-03-09 DIAGNOSIS — N83202 Unspecified ovarian cyst, left side: Secondary | ICD-10-CM | POA: Diagnosis not present

## 2021-03-09 DIAGNOSIS — R197 Diarrhea, unspecified: Secondary | ICD-10-CM | POA: Insufficient documentation

## 2021-03-09 DIAGNOSIS — R112 Nausea with vomiting, unspecified: Secondary | ICD-10-CM | POA: Diagnosis not present

## 2021-03-09 DIAGNOSIS — Z7951 Long term (current) use of inhaled steroids: Secondary | ICD-10-CM | POA: Insufficient documentation

## 2021-03-09 DIAGNOSIS — J449 Chronic obstructive pulmonary disease, unspecified: Secondary | ICD-10-CM | POA: Insufficient documentation

## 2021-03-09 LAB — CBC WITH DIFFERENTIAL/PLATELET
Abs Immature Granulocytes: 0.04 10*3/uL (ref 0.00–0.07)
Basophils Absolute: 0 10*3/uL (ref 0.0–0.1)
Basophils Relative: 0 %
Eosinophils Absolute: 0.1 10*3/uL (ref 0.0–0.5)
Eosinophils Relative: 1 %
HCT: 39.9 % (ref 36.0–46.0)
Hemoglobin: 13.5 g/dL (ref 12.0–15.0)
Immature Granulocytes: 0 %
Lymphocytes Relative: 23 %
Lymphs Abs: 2.4 10*3/uL (ref 0.7–4.0)
MCH: 29.4 pg (ref 26.0–34.0)
MCHC: 33.8 g/dL (ref 30.0–36.0)
MCV: 86.9 fL (ref 80.0–100.0)
Monocytes Absolute: 0.7 10*3/uL (ref 0.1–1.0)
Monocytes Relative: 7 %
Neutro Abs: 7.1 10*3/uL (ref 1.7–7.7)
Neutrophils Relative %: 69 %
Platelets: 208 10*3/uL (ref 150–400)
RBC: 4.59 MIL/uL (ref 3.87–5.11)
RDW: 13.4 % (ref 11.5–15.5)
WBC: 10.3 10*3/uL (ref 4.0–10.5)
nRBC: 0 % (ref 0.0–0.2)

## 2021-03-09 LAB — URINALYSIS, ROUTINE W REFLEX MICROSCOPIC
Bilirubin Urine: NEGATIVE
Glucose, UA: NEGATIVE mg/dL
Ketones, ur: NEGATIVE mg/dL
Leukocytes,Ua: NEGATIVE
Nitrite: NEGATIVE
Protein, ur: NEGATIVE mg/dL
Specific Gravity, Urine: 1.01 (ref 1.005–1.030)
pH: 6.5 (ref 5.0–8.0)

## 2021-03-09 LAB — COMPREHENSIVE METABOLIC PANEL
ALT: 20 U/L (ref 0–44)
AST: 19 U/L (ref 15–41)
Albumin: 3.8 g/dL (ref 3.5–5.0)
Alkaline Phosphatase: 61 U/L (ref 38–126)
Anion gap: 9 (ref 5–15)
BUN: 13 mg/dL (ref 6–20)
CO2: 27 mmol/L (ref 22–32)
Calcium: 9.6 mg/dL (ref 8.9–10.3)
Chloride: 97 mmol/L — ABNORMAL LOW (ref 98–111)
Creatinine, Ser: 0.55 mg/dL (ref 0.44–1.00)
GFR, Estimated: >60 mL/min (ref >60)
Glucose, Bld: 118 mg/dL — ABNORMAL HIGH (ref 70–99)
Potassium: 3.7 mmol/L (ref 3.5–5.1)
Sodium: 133 mmol/L — ABNORMAL LOW (ref 135–145)
Total Bilirubin: 0.3 mg/dL (ref 0.3–1.2)
Total Protein: 7.5 g/dL (ref 6.5–8.1)

## 2021-03-09 LAB — URINALYSIS, MICROSCOPIC (REFLEX): WBC, UA: NONE SEEN WBC/hpf (ref 0–5)

## 2021-03-09 LAB — LIPASE, BLOOD: Lipase: 22 U/L (ref 11–51)

## 2021-03-09 MED ORDER — SODIUM CHLORIDE 0.9 % IV BOLUS
500.0000 mL | Freq: Once | INTRAVENOUS | Status: AC
Start: 2021-03-09 — End: 2021-03-09
  Administered 2021-03-09: 500 mL via INTRAVENOUS

## 2021-03-09 MED ORDER — ONDANSETRON 4 MG PO TBDP: 4.0000 mg | ORAL_TABLET | Freq: Three times a day (TID) | ORAL | 0 refills | Status: DC | PRN

## 2021-03-09 MED ORDER — ONDANSETRON HCL 4 MG/2ML IJ SOLN
4.0000 mg | Freq: Once | INTRAMUSCULAR | Status: AC
Start: 2021-03-09 — End: 2021-03-09
  Administered 2021-03-09: 4 mg via INTRAVENOUS
  Filled 2021-03-09: qty 2

## 2021-03-09 MED ORDER — FENTANYL CITRATE (PF) 100 MCG/2ML IJ SOLN
50.0000 ug | Freq: Once | INTRAMUSCULAR | Status: AC
  Administered 2021-03-09: 50 ug via INTRAVENOUS
  Filled 2021-03-09: qty 2

## 2021-03-09 NOTE — ED Provider Notes (Signed)
[facility] EMERGENCY DEPARTMENT Provider Note   CSN: [id number] Arrival date & time: 03/09/21  1550     History Chief Complaint  Patient presents with  . Abdominal Pain    [name] is a 45 y.o. female.  HPI Patient is a 45 year old female with a history of COPD and IBS who presents to the emergency department with abdominal pain.  Patient states that her symptoms started about 3 days ago after eating sushi.  She states that she did not realize that the food had poppy seeds on it.  She then began developing worsening pain in the left lower quadrant with associated abdominal distention.  Also complains of intermittent nausea and vomiting as well as intermittent watery brown diarrhea.  She notes blood when wiping but denies any gross hematochezia.  States her current pain is about 8/10.  No chest pain, shortness of breath, urinary complaints.  She denies any regular alcohol use.    Past Medical History:  Diagnosis Date  . ADD (attention deficit disorder)   . Chronic back pain    LOW BACK PAIN  . Chronic back pain   . COPD (chronic obstructive pulmonary disease) (HCC)    per pt last exacerbation w/ pneumonia 12/ 2021 , no covid,  stated no residual  . Family history of adverse reaction to anesthesia    sister--- ponv/ migraines  . Full dentures   . GAD (generalized anxiety disorder)   . Generalized headaches   . MDD (major depressive disorder)   . PONV (postoperative nausea and vomiting)    and migraines   Patient Active Problem List   Diagnosis Date Noted  . MENOPAUSE, SURGICAL 07/26/2010  . NAUSEA AND VOMITING 07/16/2010  . IRRITABLE BOWEL SYNDROME 06/15/2010  . NAUSEA 06/15/2010  . Flatulence, eructation, and gas pain 06/15/2010  . RECTAL PROLAPSE 04/30/2010  . LOW BACK PAIN SYNDROME 11/24/2009  . RECTAL BLEEDING 10/26/2009  . DIARRHEA 10/26/2009  . ABDOMINAL PAIN-LLQ 10/26/2009  . ABNORMAL FINDINGS GI TRACT 10/26/2009  . TEMPOROMANDIBULAR JOINT PAIN  05/26/2009  . NECK SPRAIN AND STRAIN 11/15/2008  . ABDOMINAL PAIN, GENERALIZED 02/24/2008  . OTHER ASCITES 02/24/2008  . ANXIETY 08/14/2007  . BRONCHITIS, ACUTE 07/27/2007  . ANEMIA-NOS 04/06/2007  . DEPRESSION 04/06/2007  . ADD 04/06/2007  . UTI'S, CHRONIC 04/06/2007  . HEADACHE 04/06/2007  . IRRITABLE BOWEL SYNDROME, HX OF 04/06/2007    Past Surgical History:  Procedure Laterality Date  . CARPAL TUNNEL RELEASE Right 10/31/2020   Procedure: CARPAL TUNNEL RELEASE;  Surgeon: Sheral Apley, MD;  Location: [facility];  Service: Orthopedics;  Laterality: Right;  . COLONOSCOPY  11-15-2009   DR Arlyce Dice, CLEAR EXCEPT FOR SOME HEMORRHOIDS  . HEMORRHOID SURGERY  02/28/2006   [facility], DR. Luisa Hart  . KNEE ARTHROSCOPY W/ MENISCECTOMY Right 2005  . LAPAROSCOPIC ASSISTED VAGINAL HYSTERECTOMY  02-27-2010  @  [facility]   WITH A&P REPAIR, DR. Jarold Song  . ULNAR TUNNEL RELEASE Right 10/31/2020   Procedure: CUBITAL TUNNEL RELEASE;  Surgeon: Sheral Apley, MD;  Location: [facility];  Service: Orthopedics;  Laterality: Right;     OB History   No obstetric history on file.     Family History  Problem Relation Age of Onset  . Colon polyps Mother   . Heart disease Mother   . Irritable bowel syndrome Mother     Social History   Tobacco Use  . Smoking status: Current Every Day Smoker    Packs/day: 1.00  Types: Cigarettes  . Smokeless tobacco: Never Used  Vaping Use  . Vaping Use: Never used  Substance Use Topics  . Alcohol use: Yes    Comment: occasional  . Drug use: Never    Home Medications Prior to Admission medications   Medication Sig Start Date End Date Taking? Authorizing Provider  ondansetron (ZOFRAN ODT) 4 MG disintegrating tablet Take 1 tablet (4 mg total) by mouth every 8 (eight) hours as needed for nausea or vomiting. 03/09/21  Yes Mung Rinker, PA-C  Cyanocobalamin (B-12 PO) Take by mouth daily.    [provider]  fluticasone  (FLONASE) 50 MCG/ACT nasal spray Place 2 sprays into both nostrils daily. 01/22/21   Vallery Sa, Amy L, PA  HYDROcodone-acetaminophen (NORCO/VICODIN) 5-325 MG tablet Take 1 tablet by mouth every 4 (four) hours as needed for moderate pain. 10/31/20 10/31/21  Rainey Pines, PA-C  Multiple Vitamins-Minerals (ONE-A-DAY WOMENS PO) Take by mouth daily.    [provider]  nicotine (NICODERM CQ - DOSED IN MG/24 HOURS) 14 mg/24hr patch Place 1 patch (14 mg total) onto the skin daily. Patient not taking: Reported on 10/30/2020 09/28/20   Wieters, Hallie C, PA-C  nystatin (MYCOSTATIN) 100000 UNIT/ML suspension Take 5 mLs (500,000 Units total) by mouth 4 (four) times daily. 01/22/21   Vallery Sa, Amy L, PA  Omega-3 Fatty Acids (FISH OIL PO) Take by mouth daily.    [provider]  rizatriptan (MAXALT) 5 MG tablet Take 1 tablet (5 mg total) by mouth as needed for migraine. May repeat in 2 hours if needed 01/22/21   Vallery Sa, Amy L, PA  cetirizine (ZYRTEC ALLERGY) 10 MG tablet Take 1 tablet (10 mg total) by mouth daily. 09/04/20 09/28/20  Hall-Potvin, [place], PA-C    Allergies    Morphine, Omnicef [cefdinir], and Penicillins  Review of Systems   Review of Systems  All other systems reviewed and are negative. Ten systems reviewed and are negative for acute change, except as noted in the HPI.   Physical Exam Updated Vital Signs BP (!) 160/94 (BP Location: Right Arm)   Pulse 79   Temp 98.6 F (37 C) (Oral)   Resp 17   Ht 5\' 7"  (1.702 m)   Wt 74.8 kg   SpO2 99%   BMI 25.84 kg/m   Physical Exam Vitals and nursing note reviewed.  Constitutional:      General: She is not in acute distress.    Appearance: Normal appearance. She is not ill-appearing, toxic-appearing or diaphoretic.  HENT:     Head: Normocephalic and atraumatic.     Right Ear: External ear normal.     Left Ear: External ear normal.     Nose: Nose normal.     Mouth/Throat:     Mouth: Mucous membranes are moist.      Pharynx: Oropharynx is clear. No oropharyngeal exudate or posterior oropharyngeal erythema.  Eyes:     Extraocular Movements: Extraocular movements intact.  Cardiovascular:     Rate and Rhythm: Normal rate and regular rhythm.     Pulses: Normal pulses.     Heart sounds: Normal heart sounds. No murmur heard. No friction rub. No gallop.   Pulmonary:     Effort: Pulmonary effort is normal. No respiratory distress.     Breath sounds: Normal breath sounds. No stridor. No wheezing, rhonchi or rales.  Abdominal:     General: Abdomen is flat and protuberant. There is distension.     Tenderness: There is abdominal tenderness.  Comments: Moderately distended abdomen.  Mild tenderness noted diffusely along the left side of the abdomen with moderate tenderness in the left lower quadrant.  Musculoskeletal:        General: Normal range of motion.     Cervical back: Normal range of motion and neck supple. No tenderness.  Skin:    General: Skin is warm and dry.  Neurological:     General: No focal deficit present.     Mental Status: She is alert and oriented to person, place, and time.  Psychiatric:        Mood and Affect: Mood normal.        Behavior: Behavior normal.    ED Results / Procedures / Treatments   Labs (all labs ordered are listed, but only abnormal results are displayed) Labs Reviewed  COMPREHENSIVE METABOLIC PANEL - Abnormal; Notable for the following components:      Result Value   Sodium 133 (*)    Chloride 97 (*)    Glucose, Bld 118 (*)    All other components within normal limits  URINALYSIS, ROUTINE W REFLEX MICROSCOPIC - Abnormal; Notable for the following components:   Hgb urine dipstick TRACE (*)    All other components within normal limits  URINALYSIS, MICROSCOPIC (REFLEX) - Abnormal; Notable for the following components:   Bacteria, UA RARE (*)    All other components within normal limits  CBC WITH DIFFERENTIAL/PLATELET  LIPASE, BLOOD    EKG None  Radiology CT ABDOMEN PELVIS WO CONTRAST  Result Date: 03/09/2021 CLINICAL DATA:  Left lower quadrant pain, history of diverticulitis EXAM: CT ABDOMEN AND PELVIS WITHOUT CONTRAST TECHNIQUE: Multidetector CT imaging of the abdomen and pelvis was performed following the standard protocol without IV contrast. COMPARISON:  03/23/2013 FINDINGS: Lower chest: Lung bases are free of acute infiltrate or sizable effusion. Hepatobiliary: No focal liver abnormality is seen. No gallstones, gallbladder wall thickening, or biliary dilatation. Pancreas: Unremarkable. No pancreatic ductal dilatation or surrounding inflammatory changes. Spleen: Normal in size without focal abnormality. Adrenals/Urinary Tract: Adrenal glands are within normal limits. Kidneys are well visualized without evidence of renal calculi. Ureters are not obstructed. Bladder is within normal limits. Stomach/Bowel: Postsurgical changes are noted in the distal rectum. No obstructive or inflammatory changes of the colon are seen. Appendix is within normal limits. Small bowel and stomach are unremarkable. Vascular/Lymphatic: Aortic atherosclerosis. No enlarged abdominal or pelvic lymph nodes. Reproductive: Uterus has been surgically removed. A 3.2 cm simple appearing cyst is noted within the left ovary. Right ovary appears within normal limits. Other: No abdominal wall hernia or abnormality. No abdominopelvic ascites. Musculoskeletal: No acute or significant osseous findings. IMPRESSION: No evidence of diverticulitis. 3.2 cm cystic lesion in the left ovary. Recommend follow-up US in 6-12 months. Note: This recommendation does not apply to premenarchal patients and to those with increased risk (genetic, family history, elevated tumor markers or other high-risk factors) of ovarian cancer. Reference: JACR 2020 Feb; 17(2):248-254 No other focal abnormality is noted. Electronically Signed   By: Alcide CleverMark  Lukens M.D.   On: 03/09/2021 17:24    Procedures Procedures   Medications Ordered in ED Medications  ondansetron (ZOFRAN) injection 4 mg (4 mg Intravenous Given 03/09/21 1659)  sodium chloride 0.9 % bolus 500 mL (0 mLs Intravenous Stopped 03/09/21 1809)  fentaNYL (SUBLIMAZE) injection 50 mcg (50 mcg Intravenous Given 03/09/21 1659)   ED Course  I have reviewed the triage vital signs and the nursing notes.  Pertinent labs & imaging results that were available  during my care of the patient were reviewed by me and considered in my medical decision making (see chart for details).    MDM Rules/Calculators/A&P                          Pt is a 45 y.o. female who presents to the emergency department with nausea, vomiting, diarrhea, and left lower quadrant pain.  Labs: CBC without abnormalities. CMP with a sodium of 133, chloride of 97, glucose of 118. Lipase of 22. UA with trace hemoglobin and rare bacteria.  Imaging: CT scan of the abdomen and pelvis without contrast shows no evidence of diverticulitis.  Patient does have a 3.2 cm cystic lesion on the left ovary.  They recommend follow-up ultrasound in 6 to 12 months.  I, Placido Sou, PA-C, personally reviewed and evaluated these images and lab results as part of my medical decision-making.  CT scan is generally reassuring.  We discussed her cystic lesion and she said she will follow-up with her OB/GYN for repeat ultrasound.  Patient given a dose of Zofran and p.o. challenged successfully.  Will discharge with an additional course of Zofran.  Recommended that she follow-up with GI soon as possible regarding her symptoms.  We discussed return precautions at length.  Feel the patient is stable for discharge at this time and she is agreeable.  Her questions were answered and she was amicable at the time of discharge.  Note: Portions of this report may have been transcribed using voice recognition software. Every effort was made to ensure accuracy; however, inadvertent  computerized transcription errors may be present.   Final Clinical Impression(s) / ED Diagnoses Final diagnoses:  LLQ pain  Cyst of left ovary   Rx / DC Orders ED Discharge Orders         Ordered    ondansetron (ZOFRAN ODT) 4 MG disintegrating tablet  Every 8 hours PRN        03/09/21 1847           Placido Sou, PA-C 03/09/21 1954    Curatolo, Adam, DO 03/09/21 2005

## 2021-03-09 NOTE — ED Triage Notes (Signed)
Pt. Said she ate food with seeds on Wed. And now having pain.  Diarrhea on today vomited x 2 today.. Diarrhea x 3 today.  Pt. Reports she has been very nauseated.  Pt. Reports abd. Pain in lower L quadrant 8/10 with abd. Distention.

## 2021-03-09 NOTE — Discharge Instructions (Addendum)
Like we discussed, I am prescribing you a medication called Zofran.  Please take this only as prescribed.  You can take this up to 3 times a day for nausea and vomiting.  Only take this if you are experiencing nausea and vomiting you cannot control.  Below is a referral to a local gastroenterologist.  Please give them a call and schedule a follow-up appointment.  I would also recommend that you follow-up with your OB/GYN in 6 to 12 months regarding your left ovarian cyst and have a follow-up ultrasound performed.  If you develop any new or worsening symptoms, please return to the emergency department for reevaluation.  It was a pleasure to meet you.

## 2021-07-10 ENCOUNTER — Ambulatory Visit (INDEPENDENT_AMBULATORY_CARE_PROVIDER_SITE_OTHER): Payer: 59

## 2021-07-10 ENCOUNTER — Other Ambulatory Visit: Payer: Self-pay

## 2021-07-10 ENCOUNTER — Ambulatory Visit
Admission: EM | Admit: 2021-07-10 | Discharge: 2021-07-10 | Disposition: A | Discharge status: Home / Self Care | Payer: 59 | Attending: Physician Assistant | Admitting: Physician Assistant

## 2021-07-10 DIAGNOSIS — J441 Chronic obstructive pulmonary disease with (acute) exacerbation: Secondary | ICD-10-CM | POA: Insufficient documentation

## 2021-07-10 DIAGNOSIS — R059 Cough, unspecified: Secondary | ICD-10-CM | POA: Insufficient documentation

## 2021-07-10 DIAGNOSIS — G43909 Migraine, unspecified, not intractable, without status migrainosus: Secondary | ICD-10-CM | POA: Diagnosis present

## 2021-07-10 LAB — RAPID INFLUENZA A&B ANTIGENS
Influenza A (ARMC): NEGATIVE
Influenza B (ARMC): NEGATIVE

## 2021-07-10 MED ORDER — AZITHROMYCIN 250 MG PO TABS: 250.0000 mg | ORAL_TABLET | Freq: Every day | ORAL | 0 refills | Status: DC

## 2021-07-10 MED ORDER — BUTALBITAL-APAP-CAFFEINE 50-325-40 MG PO TABS: 1.0000 | ORAL_TABLET | Freq: Four times a day (QID) | ORAL | 0 refills | Status: AC | PRN

## 2021-07-10 NOTE — ED Triage Notes (Signed)
Patient presents to Urgent Care with complaints of body aches and migraine x 2 days ago. Pt tested positive for covid 3 weeks ago. Treating symptoms with her migraine and 800 mg ibuprofen.   Denies fever.

## 2021-07-10 NOTE — Discharge Instructions (Addendum)
-  The flu test is negative.  Your chest x-ray is clear. -I have sent in Fioricet for your migraine.  Try to only take this if absolutely needed.  It does contain Tylenol so if you are taking hydrocodone with Tylenol make sure you are not taking too much Tylenol.  You can take up to 3 g of Tylenol per day. -Increase rest and fluids.  I have sent in azithromycin for suspected COPD exacerbation.  Follow-up as needed. -Go to emergency department if the medication I have given you is not helping your migraine or you still have a migraine after the next 24 to 48 hours.

## 2021-07-10 NOTE — ED Provider Notes (Addendum)
[facility]    CSN: [id number] Arrival date & time: 07/10/21  0859      History   Chief Complaint Chief Complaint  Patient presents with   Generalized Body Aches   Migraine    HPI [name] is a 45 y.o. female presenting for 2-day history of body aches, fatigue, migraine headache, nausea and vomiting associated with a migraine headache as well as some dizziness.  Patient also admits to cough and congestion.  Reports history of COVID-19 infection 3 weeks ago.  Patient says COVID symptoms had totally resolved.  She denies any current sick contacts.  Patient denies any fevers.  She says she has taken Imitrex, Zofran, and 800 mg ibuprofen for her migraine but it has not helped.  She does have history of COPD and is a current every day smoker.  She denies any chest tightness or breathing difficulty.  Patient has no other complaints.  HPI  Past Medical History:  Diagnosis Date   ADD (attention deficit disorder)    Chronic back pain    LOW BACK PAIN   Chronic back pain    COPD (chronic obstructive pulmonary disease) (HCC)    per pt last exacerbation w/ pneumonia 12/ 2021 , no covid,  stated no residual   Family history of adverse reaction to anesthesia    sister--- ponv/ migraines   Full dentures    GAD (generalized anxiety disorder)    Generalized headaches    MDD (major depressive disorder)    PONV (postoperative nausea and vomiting)    and migraines    Patient Active Problem List   Diagnosis Date Noted   MENOPAUSE, SURGICAL 07/26/2010   NAUSEA AND VOMITING 07/16/2010   IRRITABLE BOWEL SYNDROME 06/15/2010   NAUSEA 06/15/2010   Flatulence, eructation, and gas pain 06/15/2010   RECTAL PROLAPSE 04/30/2010   LOW BACK PAIN SYNDROME 11/24/2009   RECTAL BLEEDING 10/26/2009   DIARRHEA 10/26/2009   ABDOMINAL PAIN-LLQ 10/26/2009   ABNORMAL FINDINGS GI TRACT 10/26/2009   TEMPOROMANDIBULAR JOINT PAIN 05/26/2009   NECK SPRAIN AND STRAIN 11/15/2008   ABDOMINAL  PAIN, GENERALIZED 02/24/2008   OTHER ASCITES 02/24/2008   ANXIETY 08/14/2007   BRONCHITIS, ACUTE 07/27/2007   ANEMIA-NOS 04/06/2007   DEPRESSION 04/06/2007   ADD 04/06/2007   UTI'S, CHRONIC 04/06/2007   HEADACHE 04/06/2007   IRRITABLE BOWEL SYNDROME, HX OF 04/06/2007    Past Surgical History:  Procedure Laterality Date   CARPAL TUNNEL RELEASE Right 10/31/2020   Procedure: CARPAL TUNNEL RELEASE;  Surgeon: Sheral Apley, MD;  Location: [facility] ;  Service: Orthopedics;  Laterality: Right;   COLONOSCOPY  11-15-2009   DR Arlyce Dice, CLEAR EXCEPT FOR SOME HEMORRHOIDS   HEMORRHOID SURGERY  02/28/2006   [facility], DR. Luisa Hart   KNEE ARTHROSCOPY W/ MENISCECTOMY Right 2005   LAPAROSCOPIC ASSISTED VAGINAL HYSTERECTOMY  02-27-2010  @  [facility]   WITH A&P REPAIR, DR. Jarold Song   ULNAR TUNNEL RELEASE Right 10/31/2020   Procedure: CUBITAL TUNNEL RELEASE;  Surgeon: Sheral Apley, MD;  Location: [facility];  Service: Orthopedics;  Laterality: Right;    OB History   No obstetric history on file.      Home Medications    Prior to Admission medications   Medication Sig Start Date End Date Taking? Authorizing Provider  azithromycin (ZITHROMAX) 250 MG tablet Take 1 tablet (250 mg total) by mouth daily. Take first 2 tablets together, then 1 every day until finished. 07/10/21  Yes Shirlee Latch,  PA-C  butalbital-acetaminophen-caffeine (FIORICET) 50-325-40 MG tablet Take 1 tablet by mouth every 6 (six) hours as needed for up to 5 days for headache. 07/10/21 07/15/21 Yes Shirlee Latch, PA-C  Cyanocobalamin (B-12 PO) Take by mouth daily.    [provider]  fluticasone (FLONASE) 50 MCG/ACT nasal spray Place 2 sprays into both nostrils daily. 01/22/21   Vallery Sa, Amy L, PA  HYDROcodone-acetaminophen (NORCO/VICODIN) 5-325 MG tablet Take 1 tablet by mouth every 4 (four) hours as needed for moderate pain. 10/31/20 10/31/21  Rainey Pines, PA-C  Multiple Vitamins-Minerals  (ONE-A-DAY WOMENS PO) Take by mouth daily.    [provider]  nicotine (NICODERM CQ - DOSED IN MG/24 HOURS) 14 mg/24hr patch Place 1 patch (14 mg total) onto the skin daily. Patient not taking: Reported on 10/30/2020 09/28/20   Wieters, Hallie C, PA-C  nystatin (MYCOSTATIN) 100000 UNIT/ML suspension Take 5 mLs (500,000 Units total) by mouth 4 (four) times daily. 01/22/21   Vallery Sa, Amy L, PA  Omega-3 Fatty Acids (FISH OIL PO) Take by mouth daily.    [provider]  ondansetron (ZOFRAN ODT) 4 MG disintegrating tablet Take 1 tablet (4 mg total) by mouth every 8 (eight) hours as needed for nausea or vomiting. 03/09/21   Placido Sou, PA-C  rizatriptan (MAXALT) 5 MG tablet Take 1 tablet (5 mg total) by mouth as needed for migraine. May repeat in 2 hours if needed 01/22/21   Vallery Sa, Amy L, PA  cetirizine (ZYRTEC ALLERGY) 10 MG tablet Take 1 tablet (10 mg total) by mouth daily. 09/04/20 09/28/20  Hall-Potvin, [place], PA-C    Family History Family History  Problem Relation Age of Onset   Colon polyps Mother    Heart disease Mother    Irritable bowel syndrome Mother     Social History Social History   Tobacco Use   Smoking status: Every Day    Packs/day: 1.00    Types: Cigarettes   Smokeless tobacco: Never  Vaping Use   Vaping Use: Never used  Substance Use Topics   Alcohol use: Yes    Comment: occasional   Drug use: Never     Allergies   Tramadol, Morphine, Omnicef [cefdinir], Penicillins, and Propoxyphene   Review of Systems Review of Systems  Constitutional:  Positive for fatigue. Negative for chills, diaphoresis and fever.  HENT:  Positive for congestion and rhinorrhea. Negative for ear pain, sinus pressure, sinus pain and sore throat.   Respiratory:  Positive for cough. Negative for shortness of breath.   Cardiovascular:  Negative for chest pain.  Gastrointestinal:  Positive for nausea and vomiting. Negative for abdominal pain.  Musculoskeletal:   Positive for myalgias. Negative for arthralgias.  Skin:  Negative for rash.  Neurological:  Positive for dizziness and headaches. Negative for weakness.  Hematological:  Negative for adenopathy.    Physical Exam Triage Vital Signs ED Triage Vitals  Enc Vitals Group     BP 07/10/21 0942 120/89     Pulse Rate 07/10/21 0942 83     Resp 07/10/21 0942 16     Temp 07/10/21 0942 98.8 F (37.1 C)     Temp src --      SpO2 07/10/21 0942 94 %     Weight --      Height --      Head Circumference --      Peak Flow --      Pain Score 07/10/21 0940 8     Pain Loc --  Pain Edu? --      Excl. in GC? --    No data found.  Updated Vital Signs BP 120/89 (BP Location: Left Arm)   Pulse 83   Temp 98.8 F (37.1 C)   Resp 16   SpO2 94%     Physical Exam Vitals and nursing note reviewed.  Constitutional:      General: She is not in acute distress.    Appearance: Normal appearance. She is ill-appearing. She is not toxic-appearing.  HENT:     Head: Normocephalic and atraumatic.     Right Ear: Ear canal and external ear normal. A middle ear effusion is present.     Left Ear: Ear canal and external ear normal. A middle ear effusion is present.     Nose: Congestion (mild) present.     Mouth/Throat:     Mouth: Mucous membranes are moist.     Pharynx: Oropharynx is clear.  Eyes:     General: No scleral icterus.       Right eye: No discharge.        Left eye: No discharge.     Extraocular Movements: Extraocular movements intact.     Conjunctiva/sclera: Conjunctivae normal.     Pupils: Pupils are equal, round, and reactive to light.  Cardiovascular:     Rate and Rhythm: Normal rate and regular rhythm.     Heart sounds: Normal heart sounds.  Pulmonary:     Effort: Pulmonary effort is normal. No respiratory distress.     Breath sounds: Wheezing (mild wheezing right lung-mid and base) present.  Abdominal:     Palpations: Abdomen is soft.     Tenderness: There is no abdominal  tenderness.  Musculoskeletal:     Cervical back: Neck supple.  Skin:    General: Skin is dry.  Neurological:     General: No focal deficit present.     Mental Status: She is alert and oriented to person, place, and time. Mental status is at baseline.     Cranial Nerves: No cranial nerve deficit.     Motor: No weakness.     Gait: Gait normal.  Psychiatric:        Mood and Affect: Mood normal.        Behavior: Behavior normal.        Thought Content: Thought content normal.     UC Treatments / Results  Labs (all labs ordered are listed, but only abnormal results are displayed) Labs Reviewed  RAPID INFLUENZA A&B ANTIGENS    EKG   Radiology DG Chest 2 View  Result Date: 07/10/2021 CLINICAL DATA:  Cough EXAM: CHEST - 2 VIEW COMPARISON:  09/28/2020 FINDINGS: The heart size and mediastinal contours are within normal limits. Both lungs are clear. The visualized skeletal structures are unremarkable. IMPRESSION: No active cardiopulmonary disease. Electronically Signed   By: Charlett Nose M.D.   On: 07/10/2021 11:02    Procedures Procedures (including critical care time)  Medications Ordered in UC Medications - No data to display  Initial Impression / Assessment and Plan / UC Course  I have reviewed the triage vital signs and the nursing notes.  Pertinent labs & imaging results that were available during my care of the patient were reviewed by me and considered in my medical decision making (see chart for details).  45 year old female presenting for 2-day history of body aches, fatigue, migraine headaches with associated nausea and vomiting as well as dizziness, cough and congestion.  Personal history of  COVID-19 3 weeks ago.  Past medical history significant for current everyday smoker and COPD.  Vitals are stable.  Oxygen is a little decreased at 94%.  Patient is ill-appearing but not toxic.  She has mild congestion as well as mild effusion of bilateral TMs and wheezing on right  side.   Flu test obtained. Chest x-ray ordered given slightly decreased oxygen, cough and wheezing with history of COPD.  Suspicion for possible secondary pneumonia.  Flu test is negative.  Chest x-ray does not show any evidence of pneumonia or active acute cardiopulmonary disease.  Reviewed results with patient.  Suspect patient is having a COPD exacerbation and migraine.  I have sent in Fioricet for her migraine.  Advised to take this sparingly if needed for headache.  Advised to increase rest and fluids and continue with the ibuprofen and Zofran if needed.  ED precautions for migraine reviewed with patient.  Treating COPD exacerbation with azithromycin and advised over-the-counter cough medication.  Advised use inhaler if needed for any shortness of breath.  ED precautions for cough and congestion reviewed with patient.   Final Clinical Impressions(s) / UC Diagnoses   Final diagnoses:  Migraine without status migrainosus, not intractable, unspecified migraine type  Cough  COPD exacerbation (HCC)     Discharge Instructions      -The flu test is negative.  Your chest x-ray is clear. -I have sent in Fioricet for your migraine.  Try to only take this if absolutely needed.  It does contain Tylenol so if you are taking hydrocodone with Tylenol make sure you are not taking too much Tylenol.  You can take up to 3 g of Tylenol per day. -Increase rest and fluids.  I have sent in azithromycin for suspected COPD exacerbation.  Follow-up as needed. -Go to emergency department if the medication I have given you is not helping your migraine or you still have a migraine after the next 24 to 48 hours.     ED Prescriptions     Medication Sig Dispense Auth. Provider   azithromycin (ZITHROMAX) 250 MG tablet Take 1 tablet (250 mg total) by mouth daily. Take first 2 tablets together, then 1 every day until finished. 6 tablet Shirlee Latch, PA-C   butalbital-acetaminophen-caffeine (FIORICET)  50-325-40 MG tablet Take 1 tablet by mouth every 6 (six) hours as needed for up to 5 days for headache. 20 tablet Gareth Morgan      PDMP not reviewed this encounter.   Shirlee Latch, PA-C 07/10/21 1210    Eusebio Friendly B, PA-C 07/10/21 1212

## 2021-07-27 ENCOUNTER — Encounter: Payer: Self-pay | Admitting: Emergency Medicine

## 2021-07-27 ENCOUNTER — Other Ambulatory Visit: Payer: Self-pay

## 2021-07-27 ENCOUNTER — Ambulatory Visit
Admission: EM | Admit: 2021-07-27 | Discharge: 2021-07-27 | Disposition: A | Discharge status: Home / Self Care | Payer: 59 | Attending: Physician Assistant | Admitting: Physician Assistant

## 2021-07-27 DIAGNOSIS — M545 Low back pain, unspecified: Secondary | ICD-10-CM | POA: Diagnosis not present

## 2021-07-27 MED ORDER — KETOROLAC TROMETHAMINE 30 MG/ML IJ SOLN
30.0000 mg | Freq: Once | INTRAMUSCULAR | Status: AC
  Administered 2021-07-27: 30 mg via INTRAMUSCULAR

## 2021-07-27 NOTE — ED Triage Notes (Signed)
Hx of back problems. States she started a new job and yesterday pulled a muscle in her back lifting a tray. States she already has ibuprofen, tamoxifen, flexeril, and another "inflammatory pill that's pink and ends in sodium" and that she's already tried taking these medications without improvement. Says she doesn't want a steroid pack because she will "be back in here feeling like she's having a heart attack."

## 2021-07-27 NOTE — ED Provider Notes (Signed)
[name]    CSN: [id number] Arrival date & time: 07/27/21  4166      History   Chief Complaint Chief Complaint  Patient presents with   Back Pain    HPI [name] is a 45 y.o. female.   Patient here today for evaluation of low back pain that she has had off and on for the last 10 years. She has known DDD and states typically she can control symptoms with anti-inflammatories and muscle relaxers. She started working at [employer] this week and is not sure if the floor, or her shoes, or what is different but she started to have significant low back pain after training for 3 days. She denies any numbness or tingling. She does feel muscle spasms and "knots" in her back. She reports pain does radiate down her legs at times. She reports her medications at home have not been helpful. She has also tried heat and ice without significant relief.   The history is provided by the patient.  Back Pain Associated symptoms: no fever and no numbness    Past Medical History:  Diagnosis Date   ADD (attention deficit disorder)    Chronic back pain    LOW BACK PAIN   Chronic back pain    COPD (chronic obstructive pulmonary disease) (HCC)    per pt last exacerbation w/ pneumonia 12/ 2021 , no covid,  stated no residual   Family history of adverse reaction to anesthesia    sister--- ponv/ migraines   Full dentures    GAD (generalized anxiety disorder)    Generalized headaches    MDD (major depressive disorder)    PONV (postoperative nausea and vomiting)    and migraines    Patient Active Problem List   Diagnosis Date Noted   MENOPAUSE, SURGICAL 07/26/2010   NAUSEA AND VOMITING 07/16/2010   IRRITABLE BOWEL SYNDROME 06/15/2010   NAUSEA 06/15/2010   Flatulence, eructation, and gas pain 06/15/2010   RECTAL PROLAPSE 04/30/2010   LOW BACK PAIN SYNDROME 11/24/2009   RECTAL BLEEDING 10/26/2009   DIARRHEA 10/26/2009   ABDOMINAL PAIN-LLQ 10/26/2009   ABNORMAL FINDINGS GI  TRACT 10/26/2009   TEMPOROMANDIBULAR JOINT PAIN 05/26/2009   NECK SPRAIN AND STRAIN 11/15/2008   ABDOMINAL PAIN, GENERALIZED 02/24/2008   OTHER ASCITES 02/24/2008   ANXIETY 08/14/2007   BRONCHITIS, ACUTE 07/27/2007   ANEMIA-NOS 04/06/2007   DEPRESSION 04/06/2007   ADD 04/06/2007   UTI'S, CHRONIC 04/06/2007   HEADACHE 04/06/2007   IRRITABLE BOWEL SYNDROME, HX OF 04/06/2007    Past Surgical History:  Procedure Laterality Date   CARPAL TUNNEL RELEASE Right 10/31/2020   Procedure: CARPAL TUNNEL RELEASE;  Surgeon: Sheral Apley, MD;  Location: [facility];  Service: Orthopedics;  Laterality: Right;   COLONOSCOPY  11-15-2009   DR Arlyce Dice, CLEAR EXCEPT FOR SOME HEMORRHOIDS   HEMORRHOID SURGERY  02/28/2006   [facility], DR. Luisa Hart   KNEE ARTHROSCOPY W/ MENISCECTOMY Right 2005   LAPAROSCOPIC ASSISTED VAGINAL HYSTERECTOMY  02-27-2010  @  [facility]   WITH A&P REPAIR, DR. Jarold Song   ULNAR TUNNEL RELEASE Right 10/31/2020   Procedure: CUBITAL TUNNEL RELEASE;  Surgeon: Sheral Apley, MD;  Location: [facility];  Service: Orthopedics;  Laterality: Right;    OB History   No obstetric history on file.      Home Medications    Prior to Admission medications   Medication Sig Start Date End Date Taking? Authorizing Provider  azithromycin (ZITHROMAX) 250 MG  tablet Take 1 tablet (250 mg total) by mouth daily. Take first 2 tablets together, then 1 every day until finished. 07/10/21   Shirlee Latch, PA-C  Cyanocobalamin (B-12 PO) Take by mouth daily.    [provider]  fluticasone (FLONASE) 50 MCG/ACT nasal spray Place 2 sprays into both nostrils daily. 01/22/21   Vallery Sa, Amy L, PA  Multiple Vitamins-Minerals (ONE-A-DAY WOMENS PO) Take by mouth daily.    [provider]  nicotine (NICODERM CQ - DOSED IN MG/24 HOURS) 14 mg/24hr patch Place 1 patch (14 mg total) onto the skin daily. Patient not taking: Reported on 10/30/2020 09/28/20   Wieters, Hallie C,  PA-C  nystatin (MYCOSTATIN) 100000 UNIT/ML suspension Take 5 mLs (500,000 Units total) by mouth 4 (four) times daily. 01/22/21   Vallery Sa, Amy L, PA  Omega-3 Fatty Acids (FISH OIL PO) Take by mouth daily.    [provider]  ondansetron (ZOFRAN ODT) 4 MG disintegrating tablet Take 1 tablet (4 mg total) by mouth every 8 (eight) hours as needed for nausea or vomiting. 03/09/21   Placido Sou, PA-C  rizatriptan (MAXALT) 5 MG tablet Take 1 tablet (5 mg total) by mouth as needed for migraine. May repeat in 2 hours if needed 01/22/21   Vallery Sa, Amy L, PA  cetirizine (ZYRTEC ALLERGY) 10 MG tablet Take 1 tablet (10 mg total) by mouth daily. 09/04/20 09/28/20  Hall-Potvin, [place], PA-C    Family History Family History  Problem Relation Age of Onset   Colon polyps Mother    Heart disease Mother    Irritable bowel syndrome Mother     Social History Social History   Tobacco Use   Smoking status: Every Day    Packs/day: 1.00    Types: Cigarettes   Smokeless tobacco: Never  Vaping Use   Vaping Use: Never used  Substance Use Topics   Alcohol use: Yes    Comment: occasional   Drug use: Never     Allergies   Tramadol, Morphine, Omnicef [cefdinir], Penicillins, and Propoxyphene   Review of Systems Review of Systems  Constitutional:  Negative for activity change and fever.  Respiratory:  Negative for shortness of breath.   Gastrointestinal:  Negative for nausea and vomiting.  Musculoskeletal:  Positive for back pain and myalgias.  Neurological:  Negative for numbness.    Physical Exam Triage Vital Signs ED Triage Vitals  Enc Vitals Group     BP 07/27/21 0845 128/89     Pulse Rate 07/27/21 0845 92     Resp 07/27/21 0845 18     Temp 07/27/21 0845 97.8 F (36.6 C)     Temp Source 07/27/21 0845 Oral     SpO2 07/27/21 0845 92 %     Weight --      Height --      Head Circumference --      Peak Flow --      Pain Score 07/27/21 0918 8     Pain Loc --      Pain Edu? --       Excl. in GC? --    No data found.  Updated Vital Signs BP 128/89 (BP Location: Left Arm)   Pulse 92   Temp 97.8 F (36.6 C) (Oral)   Resp 18   SpO2 92%     Physical Exam Vitals and nursing note reviewed.  Constitutional:      General: She is in acute distress.     Appearance: Normal appearance. She is  not ill-appearing.     Comments: Tearful due to back pain  HENT:     Head: Normocephalic and atraumatic.  Eyes:     Conjunctiva/sclera: Conjunctivae normal.  Cardiovascular:     Rate and Rhythm: Normal rate.  Pulmonary:     Effort: Pulmonary effort is normal.  Musculoskeletal:     Comments: No midline TTP to spine, muscle spasms noted to bilateral lower back, TTP noted to same  Neurological:     Mental Status: She is alert.  Psychiatric:        Mood and Affect: Mood normal.        Behavior: Behavior normal.     UC Treatments / Results  Labs (all labs ordered are listed, but only abnormal results are displayed) Labs Reviewed - No data to display  EKG   Radiology No results found.  Procedures Procedures (including critical care time)  Medications Ordered in UC Medications  ketorolac (TORADOL) 30 MG/ML injection 30 mg (30 mg Intramuscular Given 07/27/21 0953)    Initial Impression / Assessment and Plan / UC Course  I have reviewed the triage vital signs and the nursing notes.  Pertinent labs & imaging results that were available during my care of the patient were reviewed by me and considered in my medical decision making (see chart for details).   Will treat with Toradol injection in office today, but I dicussed that patient would likely need further management outpatient given chronicity of symptoms. She has an ortho specialist and will call to schedule appointment.   Final Clinical Impressions(s) / UC Diagnoses   Final diagnoses:  Acute bilateral low back pain, unspecified whether sciatica present     Discharge Instructions      Follow up with  specialist if possible. May need physical therapy vs other management given flare of chronic issues.      ED Prescriptions   None    PDMP not reviewed this encounter.   Tomi Bamberger, PA-C 07/27/21 1003

## 2021-07-27 NOTE — Discharge Instructions (Addendum)
Follow up with specialist if possible. May need physical therapy vs other management given flare of chronic issues.

## 2021-09-14 ENCOUNTER — Ambulatory Visit: Payer: Self-pay | Admitting: Cardiology

## 2021-09-14 DIAGNOSIS — R079 Chest pain, unspecified: Secondary | ICD-10-CM | POA: Insufficient documentation

## 2021-09-14 DIAGNOSIS — I351 Nonrheumatic aortic (valve) insufficiency: Secondary | ICD-10-CM | POA: Insufficient documentation

## 2021-09-14 MED ORDER — SODIUM CHLORIDE 0.9% FLUSH
3.0000 mL | Freq: Two times a day (BID) | INTRAVENOUS | Status: DC
  Filled 2021-09-14: qty 3

## 2021-09-21 ENCOUNTER — Encounter
Admission: RE | Disposition: A | Discharge status: Home / Self Care | Payer: Self-pay | Source: Home / Self Care | Attending: Cardiovascular Disease

## 2021-09-21 ENCOUNTER — Ambulatory Visit
Admission: RE | Admit: 2021-09-21 | Discharge: 2021-09-21 | Disposition: A | Discharge status: Home / Self Care | Payer: 59 | Attending: Cardiovascular Disease | Admitting: Cardiovascular Disease

## 2021-09-21 ENCOUNTER — Encounter: Payer: Self-pay | Admitting: Cardiovascular Disease

## 2021-09-21 ENCOUNTER — Other Ambulatory Visit: Payer: Self-pay

## 2021-09-21 DIAGNOSIS — R079 Chest pain, unspecified: Secondary | ICD-10-CM | POA: Insufficient documentation

## 2021-09-21 DIAGNOSIS — I351 Nonrheumatic aortic (valve) insufficiency: Secondary | ICD-10-CM | POA: Insufficient documentation

## 2021-09-21 DIAGNOSIS — F17210 Nicotine dependence, cigarettes, uncomplicated: Secondary | ICD-10-CM | POA: Insufficient documentation

## 2021-09-21 HISTORY — PX: RIGHT/LEFT HEART CATH AND CORONARY ANGIOGRAPHY: CATH118266

## 2021-09-21 SURGERY — RIGHT/LEFT HEART CATH AND CORONARY ANGIOGRAPHY
Anesthesia: Moderate Sedation

## 2021-09-21 MED ORDER — SODIUM CHLORIDE 0.9 % IV SOLN: 250.0000 mL | INTRAVENOUS | Status: DC | PRN

## 2021-09-21 MED ORDER — HEPARIN (PORCINE) IN NACL 1000-0.9 UT/500ML-% IV SOLN
INTRAVENOUS | Status: AC
  Filled 2021-09-21: qty 1000

## 2021-09-21 MED ORDER — SODIUM CHLORIDE 0.9 % WEIGHT BASED INFUSION
3.0000 mL/kg/h | INTRAVENOUS | Status: DC
  Administered 2021-09-21: 3 mL/kg/h via INTRAVENOUS

## 2021-09-21 MED ORDER — LIDOCAINE HCL (PF) 1 % IJ SOLN
INTRAMUSCULAR | Status: DC | PRN
  Administered 2021-09-21: 20 mL

## 2021-09-21 MED ORDER — ASPIRIN 81 MG PO CHEW
81.0000 mg | CHEWABLE_TABLET | ORAL | Status: AC
  Administered 2021-09-21: 81 mg via ORAL

## 2021-09-21 MED ORDER — MIDAZOLAM HCL 2 MG/2ML IJ SOLN
INTRAMUSCULAR | Status: AC
  Filled 2021-09-21: qty 2

## 2021-09-21 MED ORDER — HEPARIN (PORCINE) IN NACL 1000-0.9 UT/500ML-% IV SOLN
INTRAVENOUS | Status: DC | PRN
  Administered 2021-09-21 (×2): 500 mL

## 2021-09-21 MED ORDER — LIDOCAINE HCL 1 % IJ SOLN
INTRAMUSCULAR | Status: AC
  Filled 2021-09-21: qty 20

## 2021-09-21 MED ORDER — IOHEXOL 300 MG/ML  SOLN
INTRAMUSCULAR | Status: DC | PRN
  Administered 2021-09-21: 106 mL

## 2021-09-21 MED ORDER — FENTANYL CITRATE (PF) 100 MCG/2ML IJ SOLN
INTRAMUSCULAR | Status: AC
  Filled 2021-09-21: qty 2

## 2021-09-21 MED ORDER — ASPIRIN 81 MG PO CHEW
CHEWABLE_TABLET | ORAL | Status: AC
  Filled 2021-09-21: qty 1

## 2021-09-21 MED ORDER — SODIUM CHLORIDE 0.9 % WEIGHT BASED INFUSION
1.0000 mL/kg/h | INTRAVENOUS | Status: DC
  Administered 2021-09-21: 1 mL/kg/h via INTRAVENOUS

## 2021-09-21 MED ORDER — FENTANYL CITRATE (PF) 100 MCG/2ML IJ SOLN
INTRAMUSCULAR | Status: DC | PRN
  Administered 2021-09-21: 25 ug via INTRAVENOUS

## 2021-09-21 MED ORDER — SODIUM CHLORIDE 0.9% FLUSH: 3.0000 mL | INTRAVENOUS | Status: DC | PRN

## 2021-09-21 MED ORDER — MIDAZOLAM HCL 2 MG/2ML IJ SOLN
INTRAMUSCULAR | Status: DC | PRN
  Administered 2021-09-21: 1 mg via INTRAVENOUS

## 2021-09-21 SURGICAL SUPPLY — 13 items
CATH INFINITI 5FR MULTPACK ANG (CATHETERS) ×1 IMPLANT
CATH SWAN GANZ 7F STRAIGHT (CATHETERS) ×1 IMPLANT
DEVICE CLOSURE MYNXGRIP 5F (Vascular Products) ×1 IMPLANT
DEVICE CLOSURE MYNXGRIP 6/7F (Vascular Products) ×1 IMPLANT
NDL PERC 18GX7CM (NEEDLE) IMPLANT
NEEDLE PERC 18GX7CM (NEEDLE) ×2 IMPLANT
PACK CARDIAC CATH (CUSTOM PROCEDURE TRAY) ×2 IMPLANT
PROTECTION STATION PRESSURIZED (MISCELLANEOUS) ×2
SET ATX SIMPLICITY (MISCELLANEOUS) ×1 IMPLANT
SHEATH AVANTI 5FR X 11CM (SHEATH) ×1 IMPLANT
SHEATH AVANTI 7FRX11 (SHEATH) ×1 IMPLANT
STATION PROTECTION PRESSURIZED (MISCELLANEOUS) IMPLANT
WIRE GUIDERIGHT .035X150 (WIRE) ×1 IMPLANT

## 2022-04-11 ENCOUNTER — Emergency Department: Payer: Medicaid Other

## 2022-04-11 ENCOUNTER — Encounter: Payer: Self-pay | Admitting: Emergency Medicine

## 2022-04-11 ENCOUNTER — Emergency Department
Admission: EM | Admit: 2022-04-11 | Discharge: 2022-04-11 | Disposition: A | Discharge status: Home / Self Care | Payer: Medicaid Other | Attending: Emergency Medicine | Admitting: Emergency Medicine

## 2022-04-11 ENCOUNTER — Other Ambulatory Visit: Payer: Self-pay

## 2022-04-11 DIAGNOSIS — R1900 Intra-abdominal and pelvic swelling, mass and lump, unspecified site: Secondary | ICD-10-CM | POA: Insufficient documentation

## 2022-04-11 DIAGNOSIS — F1910 Other psychoactive substance abuse, uncomplicated: Secondary | ICD-10-CM | POA: Diagnosis not present

## 2022-04-11 DIAGNOSIS — R1084 Generalized abdominal pain: Secondary | ICD-10-CM | POA: Insufficient documentation

## 2022-04-11 DIAGNOSIS — R2243 Localized swelling, mass and lump, lower limb, bilateral: Secondary | ICD-10-CM | POA: Diagnosis not present

## 2022-04-11 DIAGNOSIS — R4182 Altered mental status, unspecified: Secondary | ICD-10-CM | POA: Insufficient documentation

## 2022-04-11 DIAGNOSIS — M79605 Pain in left leg: Secondary | ICD-10-CM | POA: Insufficient documentation

## 2022-04-11 DIAGNOSIS — M79604 Pain in right leg: Secondary | ICD-10-CM | POA: Insufficient documentation

## 2022-04-11 LAB — URINALYSIS, ROUTINE W REFLEX MICROSCOPIC
Bilirubin Urine: NEGATIVE
Glucose, UA: NEGATIVE mg/dL
Hgb urine dipstick: NEGATIVE
Ketones, ur: NEGATIVE mg/dL
Leukocytes,Ua: NEGATIVE
Nitrite: NEGATIVE
Protein, ur: NEGATIVE mg/dL
Specific Gravity, Urine: 1.001 — ABNORMAL LOW (ref 1.005–1.030)
pH: 8 (ref 5.0–8.0)

## 2022-04-11 LAB — COMPREHENSIVE METABOLIC PANEL
ALT: 19 U/L (ref 0–44)
AST: 18 U/L (ref 15–41)
Albumin: 3.7 g/dL (ref 3.5–5.0)
Alkaline Phosphatase: 64 U/L (ref 38–126)
Anion gap: 6 (ref 5–15)
BUN: 6 mg/dL (ref 6–20)
CO2: 26 mmol/L (ref 22–32)
Calcium: 9.1 mg/dL (ref 8.9–10.3)
Chloride: 108 mmol/L (ref 98–111)
Creatinine, Ser: 0.55 mg/dL (ref 0.44–1.00)
GFR, Estimated: >60 mL/min (ref >60)
Glucose, Bld: 106 mg/dL — ABNORMAL HIGH (ref 70–99)
Potassium: 4 mmol/L (ref 3.5–5.1)
Sodium: 140 mmol/L (ref 135–145)
Total Bilirubin: 0.6 mg/dL (ref 0.3–1.2)
Total Protein: 7.2 g/dL (ref 6.5–8.1)

## 2022-04-11 LAB — AMMONIA: Ammonia: 12 umol/L (ref 9–35)

## 2022-04-11 LAB — CBC WITH DIFFERENTIAL/PLATELET
Abs Immature Granulocytes: 0.02 10*3/uL (ref 0.00–0.07)
Basophils Absolute: 0 10*3/uL (ref 0.0–0.1)
Basophils Relative: 0 %
Eosinophils Absolute: 0.1 10*3/uL (ref 0.0–0.5)
Eosinophils Relative: 2 %
HCT: 37.9 % (ref 36.0–46.0)
Hemoglobin: 12.5 g/dL (ref 12.0–15.0)
Immature Granulocytes: 0 %
Lymphocytes Relative: 25 %
Lymphs Abs: 1.7 10*3/uL (ref 0.7–4.0)
MCH: 29.3 pg (ref 26.0–34.0)
MCHC: 33 g/dL (ref 30.0–36.0)
MCV: 88.8 fL (ref 80.0–100.0)
Monocytes Absolute: 0.6 10*3/uL (ref 0.1–1.0)
Monocytes Relative: 10 %
Neutro Abs: 4.2 10*3/uL (ref 1.7–7.7)
Neutrophils Relative %: 63 %
Platelets: 174 10*3/uL (ref 150–400)
RBC: 4.27 MIL/uL (ref 3.87–5.11)
RDW: 14 % (ref 11.5–15.5)
WBC: 6.7 10*3/uL (ref 4.0–10.5)
nRBC: 0 % (ref 0.0–0.2)

## 2022-04-11 LAB — URINE DRUG SCREEN, QUALITATIVE (ARMC ONLY)
Amphetamines, Ur Screen: POSITIVE — AB
Barbiturates, Ur Screen: NOT DETECTED
Benzodiazepine, Ur Scrn: POSITIVE — AB
Cannabinoid 50 Ng, Ur ~~LOC~~: NOT DETECTED
Cocaine Metabolite,Ur ~~LOC~~: POSITIVE — AB
MDMA (Ecstasy)Ur Screen: NOT DETECTED
Methadone Scn, Ur: NOT DETECTED
Opiate, Ur Screen: NOT DETECTED
Phencyclidine (PCP) Ur S: NOT DETECTED
Tricyclic, Ur Screen: NOT DETECTED

## 2022-04-11 LAB — ETHANOL: Alcohol, Ethyl (B): <10 mg/dL (ref <10)

## 2022-04-11 LAB — TYPE AND SCREEN
ABO/RH(D): O NEG
Antibody Screen: NEGATIVE

## 2022-04-11 LAB — PROTIME-INR
INR: 1 (ref 0.8–1.2)
Prothrombin Time: 13.2 seconds (ref 11.4–15.2)

## 2022-04-11 LAB — ACETAMINOPHEN LEVEL: Acetaminophen (Tylenol), Serum: <10 ug/mL — ABNORMAL LOW (ref 10–30)

## 2022-04-11 LAB — LIPASE, BLOOD: Lipase: 20 U/L (ref 11–51)

## 2022-04-11 MED ORDER — FAMOTIDINE 20 MG PO TABS: 20.0000 mg | ORAL_TABLET | Freq: Two times a day (BID) | ORAL | 0 refills | Status: DC

## 2022-04-11 MED ORDER — METOCLOPRAMIDE HCL 10 MG PO TABS: 10.0000 mg | ORAL_TABLET | Freq: Four times a day (QID) | ORAL | 0 refills | Status: DC | PRN

## 2022-04-11 MED ORDER — SODIUM CHLORIDE 0.9 % IV BOLUS
1000.0000 mL | Freq: Once | INTRAVENOUS | Status: AC
  Administered 2022-04-11: 1000 mL via INTRAVENOUS

## 2022-04-11 MED ORDER — NALOXONE HCL 2 MG/2ML IJ SOSY
0.4000 mg | PREFILLED_SYRINGE | Freq: Once | INTRAMUSCULAR | Status: AC
  Administered 2022-04-11: 0.4 mg via INTRAVENOUS
  Filled 2022-04-11: qty 2

## 2022-04-11 MED ORDER — ALUMINUM-MAGNESIUM-SIMETHICONE 200-200-20 MG/5ML PO SUSP: 30.0000 mL | Freq: Three times a day (TID) | ORAL | 0 refills | Status: DC

## 2022-04-11 NOTE — ED Notes (Signed)
Pt assisted from wheelchair to stretcher via 2 person assist.

## 2022-04-11 NOTE — ED Provider Notes (Signed)
[facility] Provider Note    Event Date/Time   First MD Initiated Contact with Patient 04/11/22 [phone]     (approximate)   History   Chief Complaint: Abdominal Pain   HPI  [name] is a 46 y.o. female with a past history of aortic valve regurgitation, diverticulitis, depression, opioid dependence in remission who is brought to the ED due to altered mental status since yesterday afternoon.  Unsteady on her feet, fell 2 times over the last 24 hours.  Seems confused, worse this morning.  No lateralizing symptoms.  History is limited by patient altered mental status, history is provided by family member at bedside.  He also reports intermittent bilateral leg swelling and abdominal swelling associated with severe bilateral leg pain.  Family member notes that due to the patient's severe pain, she has been obtaining Percocet from a friend.  He is not sure about quantity taken recently or when the last dose of Percocet was taken.     Physical Exam   Triage Vital Signs: ED Triage Vitals  Enc Vitals Group     BP 04/11/22 0623 108/85     Pulse Rate 04/11/22 0623 70     Resp 04/11/22 0623 16     Temp 04/11/22 0623 97.9 F (36.6 C)     Temp Source 04/11/22 0623 Oral     SpO2 04/11/22 0623 95 %     Weight 04/11/22 0618 165 lb (74.8 kg)     Height 04/11/22 0618 5\' 7"  (1.702 m)     Head Circumference --      Peak Flow --      Pain Score 04/11/22 0622 8     Pain Loc --      Pain Edu? --      Excl. in GC? --     Most recent vital signs: Vitals:   04/11/22 0730 04/11/22 1016  BP: (!) 163/91 (!) 170/97  Pulse: 63 61  Resp: 16 16  Temp:    SpO2: 95% 96%    General: Somnolent, no distress.  CV:  Good peripheral perfusion.  Normal peripheral pulses Resp:  Normal effort.  Clear to auscultation bilaterally Abd:  No distention.  Soft and nontender Other:  No appreciable lower extremity edema.  PERRL, EOMI, moves all extremities   ED Results /  Procedures / Treatments   Labs (all labs ordered are listed, but only abnormal results are displayed) Labs Reviewed  COMPREHENSIVE METABOLIC PANEL - Abnormal; Notable for the following components:      Result Value   Glucose, Bld 106 (*)    All other components within normal limits  URINALYSIS, ROUTINE W REFLEX MICROSCOPIC - Abnormal; Notable for the following components:   Color, Urine COLORLESS (*)    APPearance CLEAR (*)    Specific Gravity, Urine 1.001 (*)    All other components within normal limits  URINE DRUG SCREEN, QUALITATIVE ([facility] ONLY) - Abnormal; Notable for the following components:   Amphetamines, Ur Screen POSITIVE (*)    Cocaine Metabolite,Ur Krebs POSITIVE (*)    Benzodiazepine, Ur Scrn POSITIVE (*)    All other components within normal limits  ACETAMINOPHEN LEVEL - Abnormal; Notable for the following components:   Acetaminophen (Tylenol), Serum <10 (*)    All other components within normal limits  CBC WITH DIFFERENTIAL/PLATELET  LIPASE, BLOOD  ETHANOL  PROTIME-INR  AMMONIA  TYPE AND SCREEN     EKG Interpreted by me Sinus rhythm rate of 73.  Normal axis and intervals.  Poor R wave progression.  Normal ST segments and T waves.  No ischemic changes.   RADIOLOGY CT head interpreted by me, negative for intracranial hemorrhage.  Radiology report reviewed.  CT cervical spine unremarkable.  X-ray abdomen and chest interpreted by me, negative for free air or bowel obstruction or pneumothorax or effusion or pulmonary edema.  Radiology report reviewed.   PROCEDURES:  Procedures   MEDICATIONS ORDERED IN ED: Medications  naloxone (NARCAN) injection 0.4 mg (0.4 mg Intravenous Given 04/11/22 0735)  sodium chloride 0.9 % bolus 1,000 mL (0 mLs Intravenous Stopped 04/11/22 1007)     IMPRESSION / MDM / ASSESSMENT AND PLAN / ED COURSE  I reviewed the triage vital signs and the nursing notes.                              Differential diagnosis includes, but is not  limited to, opioid intoxication, cirrhosis, hepatic encephalopathy, alcohol intoxication, intracranial hemorrhage, ischemic stroke  Patient's presentation is most consistent with acute presentation with potential threat to life or bodily function.  Patient presents with altered mental status.  Patient's symptoms are outside of window for consideration of TNK.  Zenaida Niece screen negative, not a candidate for code stroke protocol.  Will obtain CT head and neck due to falls, abdominal series x-ray, labs.  Patient was given a trial dose of Narcan 0.4 mg IV with improvement in mental status, becoming more awake and spontaneously moving all extremities.  ----------------------------------------- 12:03 PM on 04/11/2022 ----------------------------------------- Patient is now completely awake and alert.  Abdomen reassessed, soft and nontender.  Will start on antiacid regimen pending GI follow-up.  Counseled on possible overuse of sedatives.  Patient and significant other admit to recent cocaine use.  I doubt mesenteric ischemia, aortic aneurysm or dissection.  She is nontoxic with normal vital signs and reassuring lab work-up, stable for discharge.       FINAL CLINICAL IMPRESSION(S) / ED DIAGNOSES   Final diagnoses:  Altered mental status, unspecified altered mental status type  Polysubstance abuse (HCC)  Generalized abdominal pain     Rx / DC Orders   ED Discharge Orders          Ordered    metoCLOPramide (REGLAN) 10 MG tablet  Every 6 hours PRN        04/11/22 1202    aluminum-magnesium hydroxide-simethicone (MAALOX) 200-200-20 MG/5ML SUSP  3 times daily before meals & bedtime        04/11/22 1202    famotidine (PEPCID) 20 MG tablet  2 times daily        04/11/22 1202             Note:  This document was prepared using Dragon voice recognition software and may include unintentional dictation errors.   Sharman Cheek, MD 04/11/22 [phone]

## 2022-04-11 NOTE — ED Triage Notes (Signed)
Pt ambulatory to ED lobby accomp by spouse; pt with unsteady gait, slurring of speech; pt reports swelling of abd with pain and rectal bleeding x 2 wks; st hx of same and is unsure of cause, has appt with GI doctor

## 2022-04-11 NOTE — Discharge Instructions (Addendum)
Your imaging tests and lab tests were all okay today.  Your symptoms today appear to be due to unintentional sedative overdose, possibly from the combined effect of multiple medications.  Start taking medications as prescribed to help soothe your stomach and help with your abdominal pain until you are able to follow-up with the gastroenterologist.

## 2022-04-25 ENCOUNTER — Telehealth: Payer: Self-pay

## 2022-04-25 NOTE — Telephone Encounter (Signed)
Patient wants to call her primary before scheduleing appointment     Patient wants to call her primary before scheduleing appointment
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
## 2022-06-27 ENCOUNTER — Encounter: Payer: Self-pay | Admitting: Psychiatry

## 2022-06-27 ENCOUNTER — Ambulatory Visit (INDEPENDENT_AMBULATORY_CARE_PROVIDER_SITE_OTHER): Payer: Medicaid Other | Admitting: Psychiatry

## 2022-06-27 VITALS — BP 126/86 | HR 89 | Temp 98.3°F | Ht 67.75 in | Wt 154.0 lb

## 2022-06-27 DIAGNOSIS — F331 Major depressive disorder, recurrent, moderate: Secondary | ICD-10-CM | POA: Diagnosis not present

## 2022-06-27 DIAGNOSIS — F4310 Post-traumatic stress disorder, unspecified: Secondary | ICD-10-CM

## 2022-06-27 MED ORDER — DULOXETINE HCL 30 MG PO CPEP: 30.0000 mg | ORAL_CAPSULE | Freq: Every day | ORAL | 1 refills | Status: DC

## 2022-06-27 NOTE — Patient Instructions (Addendum)
Increase duloxetine 90 mg daily  Lower the buspar half tablet three times a day for one week, then discontinue  Please contact [facility] for therapy.  Next appointment: 11/16 at 9 am

## 2022-07-11 ENCOUNTER — Other Ambulatory Visit: Payer: Self-pay | Admitting: Psychiatry
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
## 2022-08-29 ENCOUNTER — Ambulatory Visit: Payer: Medicaid Other | Admitting: Psychiatry

## 2022-09-07 ENCOUNTER — Ambulatory Visit
Admission: EM | Admit: 2022-09-07 | Discharge: 2022-09-07 | Disposition: A | Discharge status: Home / Self Care | Payer: Medicaid Other | Attending: Family Medicine | Admitting: Family Medicine

## 2022-09-07 DIAGNOSIS — R200 Anesthesia of skin: Secondary | ICD-10-CM | POA: Diagnosis not present

## 2022-09-07 DIAGNOSIS — Z9889 Other specified postprocedural states: Secondary | ICD-10-CM | POA: Diagnosis not present

## 2022-09-07 DIAGNOSIS — R202 Paresthesia of skin: Secondary | ICD-10-CM

## 2022-09-07 DIAGNOSIS — M79641 Pain in right hand: Secondary | ICD-10-CM

## 2022-09-07 MED ORDER — METHYLPREDNISOLONE 4 MG PO TBPK: ORAL_TABLET | ORAL | 0 refills | Status: DC

## 2022-09-07 MED ORDER — OXYCODONE-ACETAMINOPHEN 5-325 MG PO TABS: 1.0000 | ORAL_TABLET | Freq: Three times a day (TID) | ORAL | 0 refills | Status: DC | PRN

## 2022-09-07 NOTE — Discharge Instructions (Signed)
Take the Medrol Dosepak as directed.  This is a steroid anti-inflammatory.  Take 3 pills when you get the prescription filled, and 3 more at bedtime.  Starting tomorrow take as directed Hold Voltaren while you are on the methylprednisolone Take Percocet if needed for severe pain.  Do not drive on Percocet Call your orthopedic on Monday morning to arrange follow-up Go to ER if worse instead of better at any time

## 2022-09-07 NOTE — ED Provider Notes (Signed)
[facility]    CSN: [id number] Arrival date & time: 09/07/22  1525      History   Chief Complaint No chief complaint on file.   HPI [name] is a 46 y.o. female.   HPI  Patient is here for hand numbness.  She states has been painful and numb for 6 days straight.  It is not coming and going, it is constant.  She states she has had surgery on her right arm about a year ago.  Carpal tunnel and cubital tunnel both released.  She had good results from this and full use of her arm until 6 days ago.  No accident.  No injury.  No overuse.  She cannot think of any reason for her pain and numbness to come back. Patient states that she is not having any neck pain.  States she does not have any known cervical disc disease. She states her arm and wrist do feel weak.  Past Medical History:  Diagnosis Date   ADD (attention deficit disorder)    Chronic back pain    LOW BACK PAIN   Chronic back pain    COPD (chronic obstructive pulmonary disease) (HCC)    per pt last exacerbation w/ pneumonia 12/ 2021 , no covid,  stated no residual   Family history of adverse reaction to anesthesia    sister--- ponv/ migraines   Full dentures    GAD (generalized anxiety disorder)    Generalized headaches    MDD (major depressive disorder)    PONV (postoperative nausea and vomiting)    and migraines    Patient Active Problem List   Diagnosis Date Noted   Aortic valve regurgitation 09/14/2021   Chest pain 09/14/2021   MENOPAUSE, SURGICAL 07/26/2010   NAUSEA AND VOMITING 07/16/2010   IRRITABLE BOWEL SYNDROME 06/15/2010   NAUSEA 06/15/2010   Flatulence, eructation, and gas pain 06/15/2010   RECTAL PROLAPSE 04/30/2010   LOW BACK PAIN SYNDROME 11/24/2009   RECTAL BLEEDING 10/26/2009   DIARRHEA 10/26/2009   ABDOMINAL PAIN-LLQ 10/26/2009   ABNORMAL FINDINGS GI TRACT 10/26/2009   TEMPOROMANDIBULAR JOINT PAIN 05/26/2009   NECK SPRAIN AND STRAIN 11/15/2008   ABDOMINAL PAIN,  GENERALIZED 02/24/2008   OTHER ASCITES 02/24/2008   ANXIETY 08/14/2007   BRONCHITIS, ACUTE 07/27/2007   ANEMIA-NOS 04/06/2007   DEPRESSION 04/06/2007   ADD 04/06/2007   UTI'S, CHRONIC 04/06/2007   HEADACHE 04/06/2007   IRRITABLE BOWEL SYNDROME, HX OF 04/06/2007    Past Surgical History:  Procedure Laterality Date   CARPAL TUNNEL RELEASE Right 10/31/2020   Procedure: CARPAL TUNNEL RELEASE;  Surgeon: Renette Butters, MD;  Location: [facility];  Service: Orthopedics;  Laterality: Right;   COLONOSCOPY  11-15-2009   DR Deatra Ina, CLEAR EXCEPT FOR SOME HEMORRHOIDS   HEMORRHOID SURGERY  02/28/2006   [place], DR. Brantley Stage   KNEE ARTHROSCOPY W/ MENISCECTOMY Right 2005   LAPAROSCOPIC ASSISTED VAGINAL HYSTERECTOMY  02-27-2010  @  [place]   WITH A&P REPAIR, DR. Oda Kilts   RIGHT/LEFT HEART CATH AND CORONARY ANGIOGRAPHY N/A 09/21/2021   Procedure: RIGHT/LEFT HEART CATH AND CORONARY ANGIOGRAPHY;  Surgeon: Dionisio David, MD;  Location: [facility];  Service: Cardiovascular;  Laterality: N/A;   ULNAR TUNNEL RELEASE Right 10/31/2020   Procedure: CUBITAL TUNNEL RELEASE;  Surgeon: Renette Butters, MD;  Location: [facility];  Service: Orthopedics;  Laterality: Right;    OB History   No obstetric history on file.  Home Medications    Prior to Admission medications   Medication Sig Start Date End Date Taking? Authorizing Provider  methylPREDNISolone (MEDROL DOSEPAK) 4 MG TBPK tablet tad 09/07/22  Yes Raylene Everts, MD  oxyCODONE-acetaminophen (PERCOCET/ROXICET) 5-325 MG tablet Take 1-2 tablets by mouth every 8 (eight) hours as needed for severe pain. 09/07/22  Yes Raylene Everts, MD  albuterol (VENTOLIN HFA) 108 (90 Base) MCG/ACT inhaler Inhale 2 puffs into the lungs 3 (three) times daily. 06/27/21   [provider]  aluminum-magnesium hydroxide-simethicone (MAALOX) I037812 MG/5ML SUSP Take 30 mLs by mouth 4 (four) times daily -  before  meals and at bedtime. 04/11/22   Carrie Mew, MD  busPIRone (BUSPAR) 5 MG tablet Take 5 mg by mouth daily. 05/28/21   [provider]  DULoxetine (CYMBALTA) 30 MG capsule Take 1 capsule (30 mg total) by mouth daily. Take total of 90 mg daily. Take along with 60 mg cap 06/27/22 08/26/22  Norman Clay, MD  DULoxetine (CYMBALTA) 60 MG capsule Take 60 mg by mouth daily. 06/28/21   [provider]  famotidine (PEPCID) 20 MG tablet Take 1 tablet (20 mg total) by mouth 2 (two) times daily. 04/11/22   Carrie Mew, MD  Omega-3 Fatty Acids (FISH OIL PO) Take 1 capsule by mouth daily.    [provider]  pantoprazole (PROTONIX) 40 MG tablet Take 40 mg by mouth daily.    [provider]  SUMAtriptan (IMITREX) 25 MG tablet Take 25 mg by mouth every 2 (two) hours as needed for migraine. May repeat in 2 hours if headache persists or recurs.    [provider]  tiZANidine (ZANAFLEX) 4 MG tablet Take 4 mg by mouth 3 (three) times daily as needed. 07/09/21   [provider]  traZODone (DESYREL) 50 MG tablet Take 100 mg by mouth at bedtime. 05/04/21   [provider]  zinc gluconate 50 MG tablet Take 50 mg by mouth daily.    [provider]  cetirizine (ZYRTEC ALLERGY) 10 MG tablet Take 1 tablet (10 mg total) by mouth daily. 09/04/20 09/28/20  Hall-Potvin, [place], PA-C    Family History Family History  Problem Relation Age of Onset   Depression Mother    Colon polyps Mother    Heart disease Mother    Irritable bowel syndrome Mother    Bipolar disorder Sister     Social History Social History   Tobacco Use   Smoking status: Every Day    Packs/day: 1.00    Years: 17.00    Total pack years: 17.00    Types: Cigarettes   Smokeless tobacco: Never  Vaping Use   Vaping Use: Never used  Substance Use Topics   Alcohol use: Not Currently    Comment: occasional   Drug use: Not Currently     Allergies   Omnicef [cefdinir],  Penicillins, Hydrocodone, Morphine, Propoxyphene, and Tramadol   Review of Systems Review of Systems See HPI  Physical Exam Triage Vital Signs ED Triage Vitals  Enc Vitals Group     BP 09/07/22 1529 (!) 136/94     Pulse Rate 09/07/22 1529 80     Resp 09/07/22 1529 20     Temp 09/07/22 1529 97.8 F (36.6 C)     Temp Source 09/07/22 1529 Oral     SpO2 09/07/22 1529 99 %     Weight 09/07/22 1535 154 lb (69.9 kg)     Height --      Head Circumference --  Peak Flow --      Pain Score --      Pain Loc --      Pain Edu? --      Excl. in [place]? --    No data found.  Updated Vital Signs BP (!) 136/94 (BP Location: Left Arm)   Pulse 80   Temp 97.8 F (36.6 C) (Oral)   Resp 20   Wt 69.9 kg   SpO2 99%   BMI 23.59 kg/m      Physical Exam Constitutional:      General: She is not in acute distress.    Appearance: She is well-developed. She is ill-appearing.  HENT:     Head: Normocephalic and atraumatic.  Eyes:     Conjunctiva/sclera: Conjunctivae normal.     Pupils: Pupils are equal, round, and reactive to light.  Cardiovascular:     Rate and Rhythm: Normal rate.  Pulmonary:     Effort: Pulmonary effort is normal. No respiratory distress.  Abdominal:     General: There is no distension.     Palpations: Abdomen is soft.  Musculoskeletal:        General: Normal range of motion.     Cervical back: Normal range of motion.     Comments: Cradles right arm close to body.  Well-healed cubital tunnel and carpal tunnel scars.  Patient has weakness to wrist flexion and extension.  Numbness to touch throughout hand sparing the tip of the fifth finger.  Weak grip strength.  Secondary to pain.  Neck mobility is full  Skin:    General: Skin is warm and dry.  Neurological:     Mental Status: She is alert.      UC Treatments / Results  Labs (all labs ordered are listed, but only abnormal results are displayed) Labs Reviewed - No data to display  EKG   Radiology No  results found.  Procedures Procedures (including critical care time)  Medications Ordered in UC Medications - No data to display  Initial Impression / Assessment and Plan / UC Course  I have reviewed the triage vital signs and the nursing notes.  Pertinent labs & imaging results that were available during my care of the patient were reviewed by me and considered in my medical decision making (see chart for details).     I told the patient unfortunately not able to do imaging or nerve conduction studies to fully evaluate her hand pain.  Since its been going on for 6 days I am not as worried about it I urgent acute process, however if she gets worse she needs to go the emergency .  Follow-up with her arm surgeon  Final diagnoses:  Right hand pain  Numbness and tingling in right hand  History of carpal tunnel release     Discharge Instructions      Take the Medrol Dosepak as directed.  This is a steroid anti-inflammatory.  Take 3 pills when you get the prescription filled, and 3 more at bedtime.  Starting tomorrow take as directed Hold Voltaren while you are on the methylprednisolone Take Percocet if needed for severe pain.  Do not drive on Percocet Call your orthopedic on Monday morning to arrange follow-up Go to ER if worse instead of better at any time   ED Prescriptions     Medication Sig Dispense Auth. Provider   methylPREDNISolone (MEDROL DOSEPAK) 4 MG TBPK tablet tad 21 tablet Raylene Everts, MD   oxyCODONE-acetaminophen (PERCOCET/ROXICET) 5-325 MG tablet  Take 1-2 tablets by mouth every 8 (eight) hours as needed for severe pain. 10 tablet Eustace Moore, MD      I have reviewed the PDMP during this encounter.   Eustace Moore, MD 09/07/22 [id number]

## 2022-09-07 NOTE — ED Triage Notes (Signed)
Pt reports this is day 6 of her right hand that is completely numb. She has been having some shooting pains from her r hand to her right shoulder.  Severe pain started last night but has been bad for 6 days.  Pt had carpel tunnel surgery last year some time.

## 2022-09-25 ENCOUNTER — Emergency Department
Admission: EM | Admit: 2022-09-25 | Discharge: 2022-09-25 | Discharge status: Against Medical Advice | Payer: Medicaid Other

## 2022-10-24 ENCOUNTER — Ambulatory Visit
Admission: EM | Admit: 2022-10-24 | Discharge: 2022-10-24 | Disposition: A | Discharge status: Home / Self Care | Payer: Medicaid Other | Attending: Family Medicine | Admitting: Family Medicine

## 2022-10-24 DIAGNOSIS — F1910 Other psychoactive substance abuse, uncomplicated: Secondary | ICD-10-CM | POA: Insufficient documentation

## 2022-10-24 DIAGNOSIS — M5010 Cervical disc disorder with radiculopathy, unspecified cervical region: Secondary | ICD-10-CM

## 2022-10-24 DIAGNOSIS — F1911 Other psychoactive substance abuse, in remission: Secondary | ICD-10-CM

## 2022-10-24 MED ORDER — PREDNISONE 10 MG (21) PO TBPK: ORAL_TABLET | Freq: Every day | ORAL | 0 refills | Status: DC

## 2022-10-24 NOTE — ED Provider Notes (Signed)
[facility]    CSN: [id number] Arrival date & time: 10/24/22  1050      History   Chief Complaint Chief Complaint  Patient presents with   Neck Pain    HPI ZAHARAH AMIR is a 47 y.o. female.   HPI  Patient states that after I saw her for her cervical radiculopathy she went to her orthopedic hand surgeon.  He confirmed that her cubital tunnel and carpal tunnel syndrome had been adequately repaired with the surgery he feels she had cervical radiculopathy.  Ordered a neck MRI.  She states she was found of a bulging disc.  She states that she has been referred to a spine specialist.  That appointment is not till the first week in February.  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  She thinks that this activity may have flared up her neck.  She is currently having neck pain, limited movement, pain radiating into the arm and forearm.  She states the pain is "severe" .  she has not had any trauma fall or injury.  Her chart is reviewed.  Since I saw her last she had a hospital admission for polysubstance abuse and depression.  Drug screen was positive for multiple substances.  Past Medical History:  Diagnosis Date   ADD (attention deficit disorder)    Chronic back pain    LOW BACK PAIN   Chronic back pain    COPD (chronic obstructive pulmonary disease) (HCC)    per pt last exacerbation w/ pneumonia 12/ 2021 , no covid,  stated no residual   Family history of adverse reaction to anesthesia    sister--- ponv/ migraines   Full dentures    GAD (generalized anxiety disorder)    Generalized headaches    MDD (major depressive disorder)    PONV (postoperative nausea and vomiting)    and migraines    Patient Active Problem List   Diagnosis Date Noted   Polysubstance abuse ([place]) 10/24/2022   Aortic valve regurgitation 09/14/2021   Chest pain 09/14/2021   IRRITABLE BOWEL SYNDROME 06/15/2010   RECTAL PROLAPSE  04/30/2010   RECTAL BLEEDING 10/26/2009   ABNORMAL FINDINGS GI TRACT 10/26/2009   NECK SPRAIN AND STRAIN 11/15/2008   OTHER ASCITES 02/24/2008   ANXIETY 08/14/2007   ANEMIA-NOS 04/06/2007   DEPRESSION 04/06/2007   ADD 04/06/2007   UTI'S, CHRONIC 04/06/2007   IRRITABLE BOWEL SYNDROME, HX OF 04/06/2007    Past Surgical History:  Procedure Laterality Date   CARPAL TUNNEL RELEASE Right 10/31/2020   Procedure: CARPAL TUNNEL RELEASE;  Surgeon: Renette Butters, MD;  Location: [facility];  Service: Orthopedics;  Laterality: Right;   COLONOSCOPY  11-15-2009   DR Deatra Ina, CLEAR EXCEPT FOR SOME HEMORRHOIDS   HEMORRHOID SURGERY  02/28/2006   [place], DR. Brantley Stage   KNEE ARTHROSCOPY W/ MENISCECTOMY Right 2005   LAPAROSCOPIC ASSISTED VAGINAL HYSTERECTOMY  02-27-2010  @  [place]   WITH A&P REPAIR, DR. Oda Kilts   RIGHT/LEFT HEART CATH AND CORONARY ANGIOGRAPHY N/A 09/21/2021   Procedure: RIGHT/LEFT HEART CATH AND CORONARY ANGIOGRAPHY;  Surgeon: Dionisio David, MD;  Location: [facility];  Service: Cardiovascular;  Laterality: N/A;   ULNAR TUNNEL RELEASE Right 10/31/2020   Procedure: CUBITAL TUNNEL RELEASE;  Surgeon: Renette Butters, MD;  Location: [facility];  Service: Orthopedics;  Laterality: Right;    OB History   No obstetric  history on file.      Home Medications    Prior to Admission medications   Medication Sig Start Date End Date Taking? Authorizing Provider  predniSONE (STERAPRED UNI-PAK 21 TAB) 10 MG (21) TBPK tablet Take by mouth daily. Take 6 tabs by mouth daily  for 2 days, then 5 tabs for 2 days, then 4 tabs for 2 days, then 3 tabs for 2 days, 2 tabs for 2 days, then 1 tab by mouth daily for 2 days 10/24/22  Yes Eustace Moore, MD  albuterol (VENTOLIN HFA) 108 (90 Base) MCG/ACT inhaler Inhale 2 puffs into the lungs 3 (three) times daily. 06/27/21   [provider]  aluminum-magnesium hydroxide-simethicone (MAALOX) 200-200-20 MG/5ML  SUSP Take 30 mLs by mouth 4 (four) times daily -  before meals and at bedtime. 04/11/22   Sharman Cheek, MD  busPIRone (BUSPAR) 5 MG tablet Take 5 mg by mouth daily. 05/28/21   [provider]  DULoxetine (CYMBALTA) 30 MG capsule Take 1 capsule (30 mg total) by mouth daily. Take total of 90 mg daily. Take along with 60 mg cap 06/27/22 08/26/22  Neysa Hotter, MD  DULoxetine (CYMBALTA) 60 MG capsule Take 60 mg by mouth daily. 06/28/21   [provider]  famotidine (PEPCID) 20 MG tablet Take 1 tablet (20 mg total) by mouth 2 (two) times daily. 04/11/22   Sharman Cheek, MD  Omega-3 Fatty Acids (FISH OIL PO) Take 1 capsule by mouth daily.    [provider]  pantoprazole (PROTONIX) 40 MG tablet Take 40 mg by mouth daily.    [provider]  SUMAtriptan (IMITREX) 25 MG tablet Take 25 mg by mouth every 2 (two) hours as needed for migraine. May repeat in 2 hours if headache persists or recurs.    [provider]  tiZANidine (ZANAFLEX) 4 MG tablet Take 4 mg by mouth 3 (three) times daily as needed. 07/09/21   [provider]  traZODone (DESYREL) 50 MG tablet Take 100 mg by mouth at bedtime. 05/04/21   [provider]  zinc gluconate 50 MG tablet Take 50 mg by mouth daily.    [provider]  cetirizine (ZYRTEC ALLERGY) 10 MG tablet Take 1 tablet (10 mg total) by mouth daily. 09/04/20 09/28/20  Hall-Potvin, [place], PA-C    Family History Family History  Problem Relation Age of Onset   Depression Mother    Colon polyps Mother    Heart disease Mother    Irritable bowel syndrome Mother    Bipolar disorder Sister     Social History Social History   Tobacco Use   Smoking status: Every Day    Packs/day: 1.00    Years: 17.00    Total pack years: 17.00    Types: Cigarettes   Smokeless tobacco: Never  Vaping Use   Vaping Use: Never used  Substance Use Topics   Alcohol use: Not Currently    Comment: occasional   Drug use:  Not Currently     Allergies   Omnicef [cefdinir], Penicillins, Hydrocodone, Morphine, Nsaids, Propoxyphene, and Tramadol   Review of Systems Review of Systems See HPI  Physical Exam Triage Vital Signs ED Triage Vitals  Enc Vitals Group     BP 10/24/22 1105 (!) 148/92     Pulse Rate 10/24/22 1105 72     Resp 10/24/22 1105 18     Temp 10/24/22 1105 97.8 F (36.6 C)     Temp Source 10/24/22 1105 Oral  SpO2 10/24/22 1105 100 %     Weight --      Height --      Head Circumference --      Peak Flow --      Pain Score 10/24/22 1106 8     Pain Loc --      Pain Edu? --      Excl. in [employer]? --    No data found.  Updated Vital Signs BP (!) 148/92 (BP Location: Left Arm)   Pulse 72   Temp 97.8 F (36.6 C) (Oral)   Resp 18   SpO2 100%       Physical Exam Constitutional:      General: She is in acute distress.     Appearance: She is well-developed. She is not ill-appearing.     Comments: Patient has stiff and guarded movements.  Appears uncomfortable  HENT:     Head: Normocephalic and atraumatic.  Eyes:     Conjunctiva/sclera: Conjunctivae normal.     Pupils: Pupils are equal, round, and reactive to light.  Neck:     Comments: Tenderness in the right paraspinous muscles on the right upper body of the trapezius Cardiovascular:     Rate and Rhythm: Normal rate.  Pulmonary:     Effort: Pulmonary effort is normal. No respiratory distress.  Abdominal:     General: There is no distension.     Palpations: Abdomen is soft.  Musculoskeletal:        General: Tenderness present. Normal range of motion.     Cervical back: Tenderness present.     Comments: Neck muscles are tender.  Patient cradles right arm close to her body  Skin:    General: Skin is warm and dry.  Neurological:     General: No focal deficit present.     Mental Status: She is alert.      UC Treatments / Results  Labs (all labs ordered are listed, but only abnormal results are displayed) Labs  Reviewed - No data to display  EKG   Radiology No results found.  Procedures Procedures (including critical care time)  Medications Ordered in UC Medications - No data to display  Initial Impression / Assessment and Plan / UC Course  I have reviewed the triage vital signs and the nursing notes.  Pertinent labs & imaging results that were available during my care of the patient were reviewed by me and considered in my medical decision making (see chart for details).     Patient is taking tizanidine at night.  I offered her a prescription for gabapentin for nerve pain when she declined.  Patient requests oxycodone.  I told her that she would not be getting a prescription for any controlled substances given her history of polysubstance abuse.  I did prescribe steroids for the radicular pain. Final Clinical Impressions(s) / UC Diagnoses   Final diagnoses:  None     Discharge Instructions      Take prednisone as directed.  Take medicine with food Activity as tolerated.  Avoid heavy lifting while neck is painful See your surgeon as scheduled   ED Prescriptions     Medication Sig Dispense Auth. Provider   predniSONE (STERAPRED UNI-PAK 21 TAB) 10 MG (21) TBPK tablet Take by mouth daily. Take 6 tabs by mouth daily  for 2 days, then 5 tabs for 2 days, then 4 tabs for 2 days, then 3 tabs for 2 days, 2 tabs for 2 days, then 1 tab  by mouth daily for 2 days 42 tablet [place] See Letta Pate, MD      PDMP not reviewed this encounter.   Eustace Moore, MD 10/24/22 1201

## 2022-10-24 NOTE — Discharge Instructions (Addendum)
Take prednisone as directed.  Take medicine with food Activity as tolerated.  Avoid heavy lifting while neck is painful See your surgeon as scheduled

## 2022-10-24 NOTE — ED Triage Notes (Addendum)
Pt c/o neck pain that is radiating down RT shoulder and arm. Hx of bulging disc in neck. Has appt with surgeon Feb 6. Was seen here a couple months ago for similar sxs. Tx with steroids which helped. Ibuprofen prn. Tried to use sling she got last time but states making pain worse. Pain 8/10

## 2022-12-10 ENCOUNTER — Ambulatory Visit: Payer: Medicaid Other | Admitting: Nurse Practitioner

## 2023-01-15 ENCOUNTER — Other Ambulatory Visit: Payer: Self-pay | Admitting: Nurse Practitioner

## 2023-01-31 ENCOUNTER — Other Ambulatory Visit: Payer: Self-pay

## 2023-01-31 ENCOUNTER — Emergency Department: Payer: Medicaid Other

## 2023-01-31 ENCOUNTER — Emergency Department
Admission: EM | Admit: 2023-01-31 | Discharge: 2023-01-31 | Disposition: A | Discharge status: Home / Self Care | Payer: Medicaid Other | Attending: Emergency Medicine | Admitting: Emergency Medicine

## 2023-01-31 DIAGNOSIS — K2900 Acute gastritis without bleeding: Secondary | ICD-10-CM | POA: Insufficient documentation

## 2023-01-31 DIAGNOSIS — R000 Tachycardia, unspecified: Secondary | ICD-10-CM | POA: Insufficient documentation

## 2023-01-31 DIAGNOSIS — J449 Chronic obstructive pulmonary disease, unspecified: Secondary | ICD-10-CM | POA: Diagnosis not present

## 2023-01-31 DIAGNOSIS — R109 Unspecified abdominal pain: Secondary | ICD-10-CM | POA: Diagnosis present

## 2023-01-31 DIAGNOSIS — R1012 Left upper quadrant pain: Secondary | ICD-10-CM

## 2023-01-31 LAB — COMPREHENSIVE METABOLIC PANEL
ALT: 13 U/L (ref 0–44)
AST: 23 U/L (ref 15–41)
Albumin: 3.8 g/dL (ref 3.5–5.0)
Alkaline Phosphatase: 67 U/L (ref 38–126)
Anion gap: 10 (ref 5–15)
BUN: 9 mg/dL (ref 6–20)
CO2: 24 mmol/L (ref 22–32)
Calcium: 9.2 mg/dL (ref 8.9–10.3)
Chloride: 106 mmol/L (ref 98–111)
Creatinine, Ser: 0.71 mg/dL (ref 0.44–1.00)
GFR, Estimated: >60 mL/min (ref >60)
Glucose, Bld: 138 mg/dL — ABNORMAL HIGH (ref 70–99)
Potassium: 4 mmol/L (ref 3.5–5.1)
Sodium: 140 mmol/L (ref 135–145)
Total Bilirubin: 0.7 mg/dL (ref 0.3–1.2)
Total Protein: 7.7 g/dL (ref 6.5–8.1)

## 2023-01-31 LAB — CBC
HCT: 45.6 % (ref 36.0–46.0)
Hemoglobin: 14.9 g/dL (ref 12.0–15.0)
MCH: 28.4 pg (ref 26.0–34.0)
MCHC: 32.7 g/dL (ref 30.0–36.0)
MCV: 87 fL (ref 80.0–100.0)
Platelets: 235 10*3/uL (ref 150–400)
RBC: 5.24 MIL/uL — ABNORMAL HIGH (ref 3.87–5.11)
RDW: 13.7 % (ref 11.5–15.5)
WBC: 6.8 10*3/uL (ref 4.0–10.5)
nRBC: 0 % (ref 0.0–0.2)

## 2023-01-31 LAB — LIPASE, BLOOD: Lipase: 31 U/L (ref 11–51)

## 2023-01-31 MED ORDER — HYDROMORPHONE HCL 1 MG/ML IJ SOLN
1.0000 mg | Freq: Once | INTRAMUSCULAR | Status: AC
  Administered 2023-01-31: 1 mg via INTRAVENOUS
  Filled 2023-01-31: qty 1

## 2023-01-31 MED ORDER — SUCRALFATE 1 G PO TABS: 1.0000 g | ORAL_TABLET | Freq: Four times a day (QID) | ORAL | 1 refills | Status: AC

## 2023-01-31 MED ORDER — IOHEXOL 300 MG/ML  SOLN
100.0000 mL | Freq: Once | INTRAMUSCULAR | Status: AC | PRN
  Administered 2023-01-31: 100 mL via INTRAVENOUS

## 2023-01-31 MED ORDER — PANTOPRAZOLE SODIUM 40 MG IV SOLR
40.0000 mg | Freq: Once | INTRAVENOUS | Status: AC
  Administered 2023-01-31: 40 mg via INTRAVENOUS
  Filled 2023-01-31: qty 10

## 2023-01-31 MED ORDER — METOCLOPRAMIDE HCL 5 MG/ML IJ SOLN
10.0000 mg | INTRAMUSCULAR | Status: AC
  Administered 2023-01-31: 10 mg via INTRAVENOUS
  Filled 2023-01-31: qty 2

## 2023-01-31 MED ORDER — ONDANSETRON HCL 4 MG/2ML IJ SOLN
4.0000 mg | Freq: Once | INTRAMUSCULAR | Status: AC
  Administered 2023-01-31: 4 mg via INTRAVENOUS
  Filled 2023-01-31: qty 2

## 2023-01-31 MED ORDER — SUCRALFATE 1 G PO TABS
1.0000 g | ORAL_TABLET | Freq: Once | ORAL | Status: AC
  Administered 2023-01-31: 1 g via ORAL
  Filled 2023-01-31: qty 1

## 2023-01-31 MED ORDER — HYDROMORPHONE HCL 1 MG/ML IJ SOLN
0.5000 mg | Freq: Once | INTRAMUSCULAR | Status: AC
  Administered 2023-01-31: 0.5 mg via INTRAVENOUS
  Filled 2023-01-31: qty 0.5

## 2023-01-31 MED ORDER — FAMOTIDINE 20 MG PO TABS: 20.0000 mg | ORAL_TABLET | Freq: Two times a day (BID) | ORAL | 0 refills | Status: AC

## 2023-01-31 MED ORDER — SODIUM CHLORIDE 0.9 % IV BOLUS
1000.0000 mL | Freq: Once | INTRAVENOUS | Status: AC
  Administered 2023-01-31: 1000 mL via INTRAVENOUS

## 2023-01-31 MED ORDER — METOCLOPRAMIDE HCL 10 MG PO TABS: 10.0000 mg | ORAL_TABLET | Freq: Four times a day (QID) | ORAL | 0 refills | Status: DC | PRN

## 2023-01-31 NOTE — ED Provider Notes (Signed)
[facility] Provider Note    Event Date/Time   First MD Initiated Contact with Patient 01/31/23 1141     (approximate)   History   Chief Complaint: Abdominal Pain   HPI  [name] is a 47 y.o. female with a history of COPD, IBS, anxiety, polysubstance abuse and a reported history of "10 stomach ulcers" who comes ED complaining of left-sided abdominal pain that started last night, severe, worse with movement.  Associated with nausea, no vomiting.  No diarrhea.  No new rectal bleeding or melanotic stool.  No fever but she does endorse chills and sweats last night     Physical Exam   Triage Vital Signs: ED Triage Vitals  Enc Vitals Group     BP 01/31/23 1141 (!) 128/92     Pulse Rate 01/31/23 1138 (!) 107     Resp 01/31/23 1138 20     Temp 01/31/23 1138 97.8 F (36.6 C)     Temp Source 01/31/23 1138 Oral     SpO2 01/31/23 1138 98 %     Weight --      Height --      Head Circumference --      Peak Flow --      Pain Score 01/31/23 1139 10     Pain Loc --      Pain Edu? --      Excl. in [place]? --     Most recent vital signs: Vitals:   01/31/23 1138 01/31/23 1141  BP:  (!) 128/92  Pulse: (!) 107   Resp: 20   Temp: 97.8 F (36.6 C)   SpO2: 98%     General: Awake, no distress.  CV:  Good peripheral perfusion.  Tachycardia heart rate 100 Resp:  Normal effort.  Clear to auscultation bilaterally Abd:  No distention.  Soft with diffuse left-sided abdominal tenderness.  She is apprehensive with exam, but no peritonitis. Other:  Moist oral mucosa.   ED Results / Procedures / Treatments   Labs (all labs ordered are listed, but only abnormal results are displayed) Labs Reviewed  COMPREHENSIVE METABOLIC PANEL - Abnormal; Notable for the following components:      Result Value   Glucose, Bld 138 (*)    All other components within normal limits  CBC - Abnormal; Notable for the following components:   RBC 5.24 (*)    All other components  within normal limits  LIPASE, BLOOD  URINALYSIS, ROUTINE W REFLEX MICROSCOPIC     EKG    RADIOLOGY CT abdomen pelvis interpreted by me, negative for free air or signs of bowel obstruction.  Radiology report reviewed   PROCEDURES:  Procedures   MEDICATIONS ORDERED IN ED: Medications  sucralfate (CARAFATE) tablet 1 g (has no administration in time range)  ondansetron (ZOFRAN) injection 4 mg (has no administration in time range)  HYDROmorphone (DILAUDID) injection 0.5 mg (has no administration in time range)  HYDROmorphone (DILAUDID) injection 1 mg (1 mg Intravenous Given 01/31/23 1206)  ondansetron (ZOFRAN) injection 4 mg (4 mg Intravenous Given 01/31/23 1205)  sodium chloride 0.9 % bolus 1,000 mL (1,000 mLs Intravenous New Bag/Given 01/31/23 1205)  pantoprazole (PROTONIX) injection 40 mg (40 mg Intravenous Given 01/31/23 1206)  metoCLOPramide (REGLAN) injection 10 mg (10 mg Intravenous Given 01/31/23 1205)  iohexol (OMNIPAQUE) 300 MG/ML solution 100 mL (100 mLs Intravenous Contrast Given 01/31/23 1225)     IMPRESSION / MDM / ASSESSMENT AND PLAN / ED COURSE  I reviewed  the triage vital signs and the nursing notes.  DDx: Gastritis, intra-abdominal abscess, GI perforation, ovarian mass, diverticulitis  Patient's presentation is most consistent with acute presentation with potential threat to life or bodily function.  Patient presents with severe left-sided abdominal pain.  Will give IV fluids, Dilaudid Zofran Protonix Reglan, obtain labs and CT.   ----------------------------------------- 1:29 PM on 01/31/2023 ----------------------------------------- CT and labs are all unremarkable.  Repeat abdominal exam is now soft and benign.  I think her presentation is due to gastritis.  Will continue maximal, multimodal antacid therapy.  She has an established gastroenterologist and will call for a follow-up appointment.  Emphasized the importance of adherence to medication regimen.       FINAL CLINICAL IMPRESSION(S) / ED DIAGNOSES   Final diagnoses:  Left upper quadrant abdominal pain  Acute gastritis without hemorrhage, unspecified gastritis type     Rx / DC Orders   ED Discharge Orders          Ordered    famotidine (PEPCID) 20 MG tablet  2 times daily        01/31/23 1331    metoCLOPramide (REGLAN) 10 MG tablet  Every 6 hours PRN        01/31/23 1331    sucralfate (CARAFATE) 1 g tablet  4 times daily        01/31/23 1331             Note:  This document was prepared using Dragon voice recognition software and may include unintentional dictation errors.   Sharman Cheek, MD 01/31/23 1332

## 2023-01-31 NOTE — ED Triage Notes (Addendum)
Pt to ED via POV from home. Pt reports LLQ pain that started last night that has gotten worse. Pt reports 10 known gastric ulcers and gastritis. Pt tearful in triage and unable to sit still due to pain.

## 2023-02-01 ENCOUNTER — Emergency Department: Payer: Medicaid Other

## 2023-02-01 ENCOUNTER — Encounter: Payer: Self-pay | Admitting: Emergency Medicine

## 2023-02-01 ENCOUNTER — Emergency Department
Admission: EM | Admit: 2023-02-01 | Discharge: 2023-02-01 | Disposition: A | Discharge status: Home / Self Care | Payer: Medicaid Other | Attending: Emergency Medicine | Admitting: Emergency Medicine

## 2023-02-01 DIAGNOSIS — R0789 Other chest pain: Secondary | ICD-10-CM | POA: Diagnosis not present

## 2023-02-01 DIAGNOSIS — R079 Chest pain, unspecified: Secondary | ICD-10-CM | POA: Diagnosis present

## 2023-02-01 LAB — CBC WITH DIFFERENTIAL/PLATELET
Abs Immature Granulocytes: 0.02 10*3/uL (ref 0.00–0.07)
Basophils Absolute: 0 10*3/uL (ref 0.0–0.1)
Basophils Relative: 0 %
Eosinophils Absolute: 0.1 10*3/uL (ref 0.0–0.5)
Eosinophils Relative: 1 %
HCT: 40.1 % (ref 36.0–46.0)
Hemoglobin: 13.2 g/dL (ref 12.0–15.0)
Immature Granulocytes: 0 %
Lymphocytes Relative: 18 %
Lymphs Abs: 1.2 10*3/uL (ref 0.7–4.0)
MCH: 28.6 pg (ref 26.0–34.0)
MCHC: 32.9 g/dL (ref 30.0–36.0)
MCV: 87 fL (ref 80.0–100.0)
Monocytes Absolute: 0.4 10*3/uL (ref 0.1–1.0)
Monocytes Relative: 5 %
Neutro Abs: 5 10*3/uL (ref 1.7–7.7)
Neutrophils Relative %: 76 %
Platelets: 188 10*3/uL (ref 150–400)
RBC: 4.61 MIL/uL (ref 3.87–5.11)
RDW: 13.5 % (ref 11.5–15.5)
WBC: 6.6 10*3/uL (ref 4.0–10.5)
nRBC: 0 % (ref 0.0–0.2)

## 2023-02-01 LAB — COMPREHENSIVE METABOLIC PANEL
ALT: 11 U/L (ref 0–44)
AST: 29 U/L (ref 15–41)
Albumin: 3.5 g/dL (ref 3.5–5.0)
Alkaline Phosphatase: 62 U/L (ref 38–126)
Anion gap: 8 (ref 5–15)
BUN: 6 mg/dL (ref 6–20)
CO2: 24 mmol/L (ref 22–32)
Calcium: 8.8 mg/dL — ABNORMAL LOW (ref 8.9–10.3)
Chloride: 103 mmol/L (ref 98–111)
Creatinine, Ser: 0.6 mg/dL (ref 0.44–1.00)
GFR, Estimated: >60 mL/min (ref >60)
Glucose, Bld: 132 mg/dL — ABNORMAL HIGH (ref 70–99)
Potassium: 3.7 mmol/L (ref 3.5–5.1)
Sodium: 135 mmol/L (ref 135–145)
Total Bilirubin: 1 mg/dL (ref 0.3–1.2)
Total Protein: 6.8 g/dL (ref 6.5–8.1)

## 2023-02-01 LAB — LIPASE, BLOOD: Lipase: 34 U/L (ref 11–51)

## 2023-02-01 LAB — D-DIMER, QUANTITATIVE: D-Dimer, Quant: 0.61 ug/mL-FEU — ABNORMAL HIGH (ref 0.00–0.50)

## 2023-02-01 LAB — TROPONIN I (HIGH SENSITIVITY)
Troponin I (High Sensitivity): 2 ng/L (ref <18)
Troponin I (High Sensitivity): 3 ng/L (ref <18)

## 2023-02-01 LAB — ETHANOL: Alcohol, Ethyl (B): <10 mg/dL (ref <10)

## 2023-02-01 MED ORDER — AZITHROMYCIN 500 MG PO TABS: 500.0000 mg | ORAL_TABLET | Freq: Every day | ORAL | 0 refills | Status: AC

## 2023-02-01 MED ORDER — KETOROLAC TROMETHAMINE 15 MG/ML IJ SOLN
15.0000 mg | Freq: Once | INTRAMUSCULAR | Status: AC
  Administered 2023-02-01: 15 mg via INTRAVENOUS
  Filled 2023-02-01: qty 1

## 2023-02-01 MED ORDER — PANTOPRAZOLE SODIUM 40 MG IV SOLR
40.0000 mg | Freq: Once | INTRAVENOUS | Status: AC
  Administered 2023-02-01: 40 mg via INTRAVENOUS
  Filled 2023-02-01: qty 10

## 2023-02-01 MED ORDER — IOHEXOL 350 MG/ML SOLN
75.0000 mL | Freq: Once | INTRAVENOUS | Status: AC | PRN
  Administered 2023-02-01: 75 mL via INTRAVENOUS

## 2023-02-01 MED ORDER — HYDROMORPHONE HCL 1 MG/ML IJ SOLN
1.0000 mg | Freq: Once | INTRAMUSCULAR | Status: AC
  Administered 2023-02-01: 1 mg via INTRAVENOUS
  Filled 2023-02-01: qty 1

## 2023-02-01 MED ORDER — HALOPERIDOL LACTATE 5 MG/ML IJ SOLN
5.0000 mg | Freq: Once | INTRAMUSCULAR | Status: AC
  Administered 2023-02-01: 5 mg via INTRAVENOUS
  Filled 2023-02-01: qty 1

## 2023-02-01 MED ORDER — SODIUM CHLORIDE 0.9 % IV BOLUS
1000.0000 mL | Freq: Once | INTRAVENOUS | Status: AC
  Administered 2023-02-01: 1000 mL via INTRAVENOUS

## 2023-02-01 NOTE — ED Provider Notes (Signed)
[facility] Provider Note    Event Date/Time   First MD Initiated Contact with Patient 02/01/23 [phone]     (approximate)   History   Chief Complaint: Chest Pain (C/o chest pain that started last night. Was seen in the ED yesterday for gastritis but feels today it is more of her chest. Describes pain as pressure and sharp. C/o NV and can't take a deep breath. EMS gave 324 ASA and 1 nitro)   HPI  Rochanda R Lanese is a 47 y.o. female with a history of anxiety, IBS, polysubstance abuse who comes the ED complaining of central chest pain that started last night while laying down.  Radiates up the chest as well as to her back.  Feels sharp.  Hurts to breathe.  No exertional symptoms.  No fevers chills lightheadedness or extremity weakness.  She was seen in the ED yesterday, had labs and CT of the abdomen and pelvis, exam that was consistent with gastritis after reassuring workup.  Was discharged home with increased antacid regimen.  She reports that the abdominal pain has resolved and the pain is now all squarely in her chest     Physical Exam   Triage Vital Signs: ED Triage Vitals  Enc Vitals Group     BP 02/01/23 0657 125/84     Pulse Rate 02/01/23 0657 89     Resp 02/01/23 0657 18     Temp 02/01/23 0658 97.8 F (36.6 C)     Temp Source 02/01/23 0658 Oral     SpO2 02/01/23 0657 90 %     Weight --      Height --      Head Circumference --      Peak Flow --      Pain Score 02/01/23 0656 10     Pain Loc --      Pain Edu? --      Excl. in [place]? --     Most recent vital signs: Vitals:   02/01/23 0658 02/01/23 0930  BP:  100/71  Pulse:  70  Resp:  19  Temp: 97.8 F (36.6 C)   SpO2:  95%    General: Awake, no distress.  CV:  Good peripheral perfusion.  Regular rate and rhythm.  Normal distal pulses Resp:  Normal effort.  Clear to auscultation bilaterally Abd:  No distention.  Soft nontender Other:  Moist oral mucosa.  No rash or pallor.   ED  Results / Procedures / Treatments   Labs (all labs ordered are listed, but only abnormal results are displayed) Labs Reviewed  COMPREHENSIVE METABOLIC PANEL - Abnormal; Notable for the following components:      Result Value   Glucose, Bld 132 (*)    Calcium 8.8 (*)    All other components within normal limits  D-DIMER, QUANTITATIVE (NOT AT [facility]) - Abnormal; Notable for the following components:   D-Dimer, Quant 0.61 (*)    All other components within normal limits  LIPASE, BLOOD  CBC WITH DIFFERENTIAL/PLATELET  ETHANOL  TROPONIN I (HIGH SENSITIVITY)  TROPONIN I (HIGH SENSITIVITY)     EKG Interpreted by me Normal sinus rhythm rate of 90.  Normal axis intervals QRS ST segments and T waves   RADIOLOGY Chest x-ray interpreted by me, appears normal.  Radiology report reviewed.  CT angiogram of the chest unremarkable, negative for PE.  Does show some mild lung inflammatory changes   PROCEDURES:  Procedures   MEDICATIONS ORDERED IN ED: Medications  haloperidol lactate (HALDOL) injection 5 mg (5 mg Intravenous Given 02/01/23 0742)  sodium chloride 0.9 % bolus 1,000 mL (0 mLs Intravenous Stopped 02/01/23 1001)  HYDROmorphone (DILAUDID) injection 1 mg (1 mg Intravenous Given 02/01/23 0742)  ketorolac (TORADOL) 15 MG/ML injection 15 mg (15 mg Intravenous Given 02/01/23 0742)  pantoprazole (PROTONIX) injection 40 mg (40 mg Intravenous Given 02/01/23 0741)  iohexol (OMNIPAQUE) 350 MG/ML injection 75 mL (75 mLs Intravenous Contrast Given 02/01/23 0935)     IMPRESSION / MDM / ASSESSMENT AND PLAN / ED COURSE  I reviewed the triage vital signs and the nursing notes.  DDx: GERD, pneumothorax, anxiety, malingering, doubt NSTEMI or TAD or pulmonary embolism  Patient's presentation is most consistent with acute presentation with potential threat to life or bodily function.  Patient presents with atypical chest pain.  Vital signs are normal, making PE unlikely.  Will screen with D-dimer  along with other labs.  Will give IV fluids, Haldol Toradol Protonix and Dilaudid for symptom relief.  ----------------------------------------- 10:23 AM on 02/01/2023 ----------------------------------------- Workup reassuring.  CT negative except for mild changes of bronchitis.  Will give azithromycin.  Stable for discharge       FINAL CLINICAL IMPRESSION(S) / ED DIAGNOSES   Final diagnoses:  Atypical chest pain     Rx / DC Orders   ED Discharge Orders          Ordered    azithromycin (ZITHROMAX) 500 MG tablet  Daily        02/01/23 1022             Note:  This document was prepared using Dragon voice recognition software and may include unintentional dictation errors.   Sharman Cheek, MD 02/01/23 1023

## 2023-02-01 NOTE — Discharge Instructions (Addendum)
Your lab tests and CT scan of the chest were all normal today.  Continue taking your medications, drink plenty of water to stay hydrated and follow-up with your doctor.

## 2023-02-11 ENCOUNTER — Other Ambulatory Visit: Payer: Self-pay | Admitting: Nurse Practitioner

## 2023-02-26 ENCOUNTER — Other Ambulatory Visit: Payer: Medicaid Other

## 2023-02-26 DIAGNOSIS — E785 Hyperlipidemia, unspecified: Secondary | ICD-10-CM

## 2023-02-26 DIAGNOSIS — I10 Essential (primary) hypertension: Secondary | ICD-10-CM

## 2023-02-26 DIAGNOSIS — E663 Overweight: Secondary | ICD-10-CM

## 2023-02-27 ENCOUNTER — Emergency Department (HOSPITAL_COMMUNITY): Payer: Medicaid Other

## 2023-02-27 ENCOUNTER — Other Ambulatory Visit: Payer: Self-pay

## 2023-02-27 ENCOUNTER — Ambulatory Visit: Payer: Medicaid Other | Admitting: Nurse Practitioner

## 2023-02-27 ENCOUNTER — Ambulatory Visit
Admission: EM | Admit: 2023-02-27 | Discharge: 2023-02-27 | Discharge status: Against Medical Advice | Payer: Medicaid Other | Attending: Family Medicine | Admitting: Family Medicine

## 2023-02-27 ENCOUNTER — Encounter (HOSPITAL_COMMUNITY): Payer: Self-pay

## 2023-02-27 ENCOUNTER — Emergency Department (HOSPITAL_COMMUNITY)
Admission: EM | Admit: 2023-02-27 | Discharge: 2023-02-27 | Disposition: A | Discharge status: Home / Self Care | Payer: Medicaid Other | Attending: Emergency Medicine | Admitting: Emergency Medicine

## 2023-02-27 DIAGNOSIS — F17210 Nicotine dependence, cigarettes, uncomplicated: Secondary | ICD-10-CM | POA: Diagnosis not present

## 2023-02-27 DIAGNOSIS — R079 Chest pain, unspecified: Secondary | ICD-10-CM | POA: Diagnosis not present

## 2023-02-27 DIAGNOSIS — R140 Abdominal distension (gaseous): Secondary | ICD-10-CM | POA: Insufficient documentation

## 2023-02-27 DIAGNOSIS — R600 Localized edema: Secondary | ICD-10-CM | POA: Diagnosis not present

## 2023-02-27 DIAGNOSIS — R609 Edema, unspecified: Secondary | ICD-10-CM

## 2023-02-27 DIAGNOSIS — J449 Chronic obstructive pulmonary disease, unspecified: Secondary | ICD-10-CM | POA: Insufficient documentation

## 2023-02-27 LAB — HEPATIC FUNCTION PANEL
ALT: 26 U/L (ref 0–44)
AST: 22 U/L (ref 15–41)
Albumin: 3.2 g/dL — ABNORMAL LOW (ref 3.5–5.0)
Alkaline Phosphatase: 60 U/L (ref 38–126)
Bilirubin, Direct: <0.1 mg/dL (ref 0.0–0.2)
Total Bilirubin: 0.8 mg/dL (ref 0.3–1.2)
Total Protein: 6.4 g/dL — ABNORMAL LOW (ref 6.5–8.1)

## 2023-02-27 LAB — BASIC METABOLIC PANEL
Anion gap: 7 (ref 5–15)
BUN: 5 mg/dL — ABNORMAL LOW (ref 6–20)
CO2: 31 mmol/L (ref 22–32)
Calcium: 8.5 mg/dL — ABNORMAL LOW (ref 8.9–10.3)
Chloride: 99 mmol/L (ref 98–111)
Creatinine, Ser: 0.51 mg/dL (ref 0.44–1.00)
GFR, Estimated: >60 mL/min (ref >60)
Glucose, Bld: 98 mg/dL (ref 70–99)
Potassium: 3.7 mmol/L (ref 3.5–5.1)
Sodium: 137 mmol/L (ref 135–145)

## 2023-02-27 LAB — CBC WITH DIFFERENTIAL
Basophils Absolute: 0 10*3/uL (ref 0.0–0.2)
Basos: 0 %
EOS (ABSOLUTE): 0.3 10*3/uL (ref 0.0–0.4)
Eos: 3 %
Hematocrit: 34.1 % (ref 34.0–46.6)
Hemoglobin: 11.6 g/dL (ref 11.1–15.9)
Immature Grans (Abs): 0 10*3/uL (ref 0.0–0.1)
Immature Granulocytes: 0 %
Lymphocytes Absolute: 2 10*3/uL (ref 0.7–3.1)
Lymphs: 23 %
MCH: 29.3 pg (ref 26.6–33.0)
MCHC: 34 g/dL (ref 31.5–35.7)
MCV: 86 fL (ref 79–97)
Monocytes Absolute: 0.6 10*3/uL (ref 0.1–0.9)
Monocytes: 7 %
Neutrophils Absolute: 5.7 10*3/uL (ref 1.4–7.0)
Neutrophils: 67 %
RBC: 3.96 x10E6/uL (ref 3.77–5.28)
RDW: 13.4 % (ref 11.7–15.4)
WBC: 8.6 10*3/uL (ref 3.4–10.8)

## 2023-02-27 LAB — CMP14+EGFR
ALT: 26 IU/L (ref 0–32)
AST: 26 IU/L (ref 0–40)
Albumin/Globulin Ratio: 1.8 (ref 1.2–2.2)
Albumin: 4 g/dL (ref 3.9–4.9)
Alkaline Phosphatase: 75 IU/L (ref 44–121)
BUN/Creatinine Ratio: 22 (ref 9–23)
BUN: 12 mg/dL (ref 6–24)
Bilirubin Total: 0.2 mg/dL (ref 0.0–1.2)
CO2: 30 mmol/L — ABNORMAL HIGH (ref 20–29)
Calcium: 8.4 mg/dL — ABNORMAL LOW (ref 8.7–10.2)
Chloride: 99 mmol/L (ref 96–106)
Creatinine, Ser: 0.54 mg/dL — ABNORMAL LOW (ref 0.57–1.00)
Globulin, Total: 2.2 g/dL (ref 1.5–4.5)
Glucose: 95 mg/dL (ref 70–99)
Potassium: 3.7 mmol/L (ref 3.5–5.2)
Sodium: 140 mmol/L (ref 134–144)
Total Protein: 6.2 g/dL (ref 6.0–8.5)
eGFR: 116 mL/min/{1.73_m2} (ref >59)

## 2023-02-27 LAB — CBC
HCT: 35 % — ABNORMAL LOW (ref 36.0–46.0)
Hemoglobin: 11.3 g/dL — ABNORMAL LOW (ref 12.0–15.0)
MCH: 28.5 pg (ref 26.0–34.0)
MCHC: 32.3 g/dL (ref 30.0–36.0)
MCV: 88.4 fL (ref 80.0–100.0)
Platelets: 188 10*3/uL (ref 150–400)
RBC: 3.96 MIL/uL (ref 3.87–5.11)
RDW: 13.9 % (ref 11.5–15.5)
WBC: 9.2 10*3/uL (ref 4.0–10.5)
nRBC: 0 % (ref 0.0–0.2)

## 2023-02-27 LAB — TSH: TSH: 3.85 u[IU]/mL (ref 0.450–4.500)

## 2023-02-27 LAB — TROPONIN I (HIGH SENSITIVITY)
Troponin I (High Sensitivity): 3 ng/L (ref <18)
Troponin I (High Sensitivity): 4 ng/L (ref <18)

## 2023-02-27 LAB — LIPID PANEL
Chol/HDL Ratio: 3.5 ratio (ref 0.0–4.4)
Cholesterol, Total: 159 mg/dL (ref 100–199)
HDL: 46 mg/dL (ref >39)
LDL Chol Calc (NIH): 78 mg/dL (ref 0–99)
Triglycerides: 208 mg/dL — ABNORMAL HIGH (ref 0–149)
VLDL Cholesterol Cal: 35 mg/dL (ref 5–40)

## 2023-02-27 LAB — I-STAT BETA HCG BLOOD, ED (MC, WL, AP ONLY): I-stat hCG, quantitative: <5 m[IU]/mL (ref <5)

## 2023-02-27 LAB — PROTIME-INR
INR: 0.9 (ref 0.8–1.2)
Prothrombin Time: 12.3 seconds (ref 11.4–15.2)

## 2023-02-27 LAB — HEMOGLOBIN A1C
Est. average glucose Bld gHb Est-mCnc: 114 mg/dL
Hgb A1c MFr Bld: 5.6 % (ref 4.8–5.6)

## 2023-02-27 LAB — BRAIN NATRIURETIC PEPTIDE: B Natriuretic Peptide: 91.1 pg/mL (ref 0.0–100.0)

## 2023-02-27 MED ORDER — HYDROMORPHONE HCL 1 MG/ML IJ SOLN
1.0000 mg | Freq: Once | INTRAMUSCULAR | Status: AC
  Administered 2023-02-27: 1 mg via INTRAVENOUS
  Filled 2023-02-27: qty 1

## 2023-02-27 MED ORDER — SODIUM CHLORIDE 0.9 % IV BOLUS
500.0000 mL | Freq: Once | INTRAVENOUS | Status: AC
  Administered 2023-02-27: 500 mL via INTRAVENOUS

## 2023-02-27 MED ORDER — ONDANSETRON HCL 4 MG/2ML IJ SOLN
4.0000 mg | Freq: Once | INTRAMUSCULAR | Status: AC
  Administered 2023-02-27: 4 mg via INTRAVENOUS
  Filled 2023-02-27: qty 2

## 2023-02-27 MED ORDER — IOHEXOL 350 MG/ML SOLN
75.0000 mL | Freq: Once | INTRAVENOUS | Status: AC | PRN
  Administered 2023-02-27: 75 mL via INTRAVENOUS

## 2023-02-27 NOTE — ED Provider Notes (Signed)
[place] [facility]MENT AT [facility] Provider Note  CSN: [id number] Arrival date & time: 02/27/23 1013  Chief Complaint(s) Chest Pain and Abdominal Pain  HPI [name] is a 47 y.o. female with past medical history as below, significant for chronic back pain, COPD, GAD, MDD, polysubstance abuse, aortic regurg, IBS, chronic anemia who presents to the ED with complaint of chest pain, abdominal pain.  Patient reports midsternal discomfort, epigastric discomfort over the past few days.  Worsening swelling to her abdomen and lower extremities.  Pain described as pleuritic.  She has mild dyspnea when lying flat and when exerting herself.  Does not use home oxygen.  Initially went to urgent care this morning but left prior to evaluation.  No nausea or vomiting, no jaundice, no itching to her skin, no change in bowel or bladder function, denies daily alcohol use or daily NSAID use.  No BRBPR or melena   Past Medical History Past Medical History:  Diagnosis Date   ADD (attention deficit disorder)    Chronic back pain    LOW BACK PAIN   Chronic back pain    COPD (chronic obstructive pulmonary disease) (HCC)    per pt last exacerbation w/ pneumonia 12/ 2021 , no covid,  stated no residual   Family history of adverse reaction to anesthesia    sister--- ponv/ migraines   Full dentures    GAD (generalized anxiety disorder)    Generalized headaches    MDD (major depressive disorder)    PONV (postoperative nausea and vomiting)    and migraines   Patient Active Problem List   Diagnosis Date Noted   Polysubstance abuse (HCC) 10/24/2022   Aortic valve regurgitation 09/14/2021   Chest pain 09/14/2021   IRRITABLE BOWEL SYNDROME 06/15/2010   RECTAL PROLAPSE 04/30/2010   RECTAL BLEEDING 10/26/2009   ABNORMAL FINDINGS GI TRACT 10/26/2009   NECK SPRAIN AND STRAIN 11/15/2008   OTHER ASCITES 02/24/2008   ANXIETY 08/14/2007   ANEMIA-NOS 04/06/2007   DEPRESSION 04/06/2007   ADD  04/06/2007   UTI'S, CHRONIC 04/06/2007   IRRITABLE BOWEL SYNDROME, HX OF 04/06/2007   Home Medication(s) Prior to Admission medications   Medication Sig Start Date End Date Taking? Authorizing Provider  albuterol (VENTOLIN HFA) 108 (90 Base) MCG/ACT inhaler Inhale 2 puffs into the lungs 3 (three) times daily. 06/27/21   [provider]  aluminum-magnesium hydroxide-simethicone (MAALOX) 200-200-20 MG/5ML SUSP Take 30 mLs by mouth 4 (four) times daily -  before meals and at bedtime. 04/11/22   Sharman Cheek, MD  busPIRone (BUSPAR) 5 MG tablet Take 5 mg by mouth daily. 05/28/21   [provider]  DULoxetine (CYMBALTA) 60 MG capsule Take 60 mg by mouth daily. 06/28/21   [provider]  famotidine (PEPCID) 20 MG tablet Take 1 tablet (20 mg total) by mouth 2 (two) times daily. 01/31/23   Sharman Cheek, MD  metoCLOPramide (REGLAN) 10 MG tablet Take 1 tablet (10 mg total) by mouth every 6 (six) hours as needed. 01/31/23   Sharman Cheek, MD  nystatin (MYCOSTATIN) 100000 UNIT/ML suspension SWISH AND SPIT 5 ML BY MOUTH EVERY 6 HOURS X 10 DAYS 02/13/23   Orson Eva, NP  Omega-3 Fatty Acids (FISH OIL PO) Take 1 capsule by mouth daily.    [provider]  pantoprazole (PROTONIX) 40 MG tablet Take 40 mg by mouth daily.    [provider]  predniSONE (STERAPRED UNI-PAK 21 TAB) 10 MG (21) TBPK tablet Take by mouth daily.  Take 6 tabs by mouth daily  for 2 days, then 5 tabs for 2 days, then 4 tabs for 2 days, then 3 tabs for 2 days, 2 tabs for 2 days, then 1 tab by mouth daily for 2 days 10/24/22   Eustace Moore, MD  sucralfate (CARAFATE) 1 g tablet Take 1 tablet (1 g total) by mouth 4 (four) times daily. 01/31/23   Sharman Cheek, MD  SUMAtriptan (IMITREX) 25 MG tablet Take 25 mg by mouth every 2 (two) hours as needed for migraine. May repeat in 2 hours if headache persists or recurs.    [provider]  SUMAtriptan (IMITREX) 50 MG tablet TAKE 1  TABLET FOR 1 DOSE AT TIME OF MIGRAINE AND MAY REPEAT 1 TABLET 4 HOURS LATER IF MIGRAINE PERSISTS 01/16/23   Orson Eva, NP  tiZANidine (ZANAFLEX) 4 MG tablet Take 4 mg by mouth 3 (three) times daily as needed. 07/09/21   [provider]  traZODone (DESYREL) 100 MG tablet TAKE 1 TABLET BY MOUTH NIGHTLY AT BEDTIME AS NEEDED FOR SLEEP 02/13/23   Orson Eva, NP  traZODone (DESYREL) 50 MG tablet Take 100 mg by mouth at bedtime. 05/04/21   [provider]  zinc gluconate 50 MG tablet Take 50 mg by mouth daily.    [provider]  cetirizine (ZYRTEC ALLERGY) 10 MG tablet Take 1 tablet (10 mg total) by mouth daily. 09/04/20 09/28/20  Hall-Potvin, [place], PA-C                                                                                                                                    Past Surgical History Past Surgical History:  Procedure Laterality Date   CARPAL TUNNEL RELEASE Right 10/31/2020   Procedure: CARPAL TUNNEL RELEASE;  Surgeon: Sheral Apley, MD;  Location: [facility];  Service: Orthopedics;  Laterality: Right;   COLONOSCOPY  11-15-2009   DR Arlyce Dice, CLEAR EXCEPT FOR SOME HEMORRHOIDS   HEMORRHOID SURGERY  02/28/2006   [facility], DR. Luisa Hart   KNEE ARTHROSCOPY W/ MENISCECTOMY Right 2005   LAPAROSCOPIC ASSISTED VAGINAL HYSTERECTOMY  02-27-2010  @  [facility]   WITH A&P REPAIR, DR. Jarold Song   RIGHT/LEFT HEART CATH AND CORONARY ANGIOGRAPHY N/A 09/21/2021   Procedure: RIGHT/LEFT HEART CATH AND CORONARY ANGIOGRAPHY;  Surgeon: Laurier Nancy, MD;  Location: [facility] IN[facility];  Service: Cardiovascular;  Laterality: N/A;   ULNAR TUNNEL RELEASE Right 10/31/2020   Procedure: CUBITAL TUNNEL RELEASE;  Surgeon: Sheral Apley, MD;  Location: [facility];  Service: Orthopedics;  Laterality: Right;   Family History Family History  Problem Relation Age of Onset   Depression Mother    Colon polyps Mother    Heart disease Mother     Irritable bowel syndrome Mother    Bipolar disorder Sister     Social History Social History   Tobacco Use   Smoking status: Every Day  Packs/day: 1.00    Years: 17.00    Additional pack years: 0.00    Total pack years: 17.00    Types: Cigarettes   Smokeless tobacco: Never  Vaping Use   Vaping Use: Never used  Substance Use Topics   Alcohol use: Not Currently    Comment: occasional   Drug use: Not Currently   Allergies Omnicef [cefdinir], Penicillins, Hydrocodone, Morphine, Nsaids, Propoxyphene, and Tramadol  Review of Systems Review of Systems  Constitutional:  Negative for activity change and fever.  HENT:  Negative for facial swelling and trouble swallowing.   Eyes:  Negative for discharge and redness.  Respiratory:  Positive for shortness of breath. Negative for cough.   Cardiovascular:  Positive for chest pain and leg swelling. Negative for palpitations.  Gastrointestinal:  Positive for abdominal distention. Negative for abdominal pain and nausea.  Genitourinary:  Negative for dysuria and flank pain.  Musculoskeletal:  Negative for back pain and gait problem.  Skin:  Negative for pallor and rash.  Neurological:  Negative for syncope and headaches.    Physical Exam Vital Signs  I have reviewed the triage vital signs BP 121/87   Pulse 93   Temp 98.6 F (37 C) (Oral)   Resp 20   Ht 5' 7.75" (1.721 m)   Wt 69.9 kg   SpO2 99%   BMI 23.60 kg/m  Physical Exam Vitals and nursing note reviewed.  Constitutional:      General: She is not in acute distress.    Appearance: Normal appearance.  HENT:     Head: Normocephalic and atraumatic.     Right Ear: External ear normal.     Left Ear: External ear normal.     Nose: Nose normal.     Mouth/Throat:     Mouth: Mucous membranes are moist.  Eyes:     General: No scleral icterus.       Right eye: No discharge.        Left eye: No discharge.  Cardiovascular:     Rate and Rhythm: Normal rate and regular rhythm.      Pulses: Normal pulses.     Heart sounds: Normal heart sounds.  Pulmonary:     Effort: Pulmonary effort is normal. No respiratory distress.     Breath sounds: Normal breath sounds.  Abdominal:     General: Abdomen is flat. There is distension.     Palpations: Abdomen is soft.     Tenderness: There is no abdominal tenderness. There is no guarding.  Musculoskeletal:        General: Normal range of motion.     Right lower leg: Edema present.     Left lower leg: Edema present.     Comments: 2+ pitting  Skin:    General: Skin is warm and dry.     Capillary Refill: Capillary refill takes less than 2 seconds.  Neurological:     Mental Status: She is alert.  Psychiatric:        Mood and Affect: Mood normal.        Behavior: Behavior normal.     ED Results and Treatments Labs (all labs ordered are listed, but only abnormal results are displayed) Labs Reviewed  BASIC METABOLIC PANEL - Abnormal; Notable for the following components:      Result Value   BUN 5 (*)    Calcium 8.5 (*)    All other components within normal limits  CBC - Abnormal; Notable for the following components:  Hemoglobin 11.3 (*)    HCT 35.0 (*)    All other components within normal limits  HEPATIC FUNCTION PANEL - Abnormal; Notable for the following components:   Total Protein 6.4 (*)    Albumin 3.2 (*)    All other components within normal limits  BRAIN NATRIURETIC PEPTIDE  PROTIME-INR  I-STAT BETA HCG BLOOD, ED ([facility], [facility], [facility] ONLY)  TROPONIN I (HIGH SENSITIVITY)  TROPONIN I (HIGH SENSITIVITY)                                                                                                                          Radiology No results found.  Pertinent labs & imaging results that were available during my care of the patient were reviewed by me and considered in my medical decision making (see MDM for details).  Medications Ordered in ED Medications  HYDROmorphone (DILAUDID) injection 1 mg (1 mg  Intravenous Given 02/27/23 1233)  ondansetron (ZOFRAN) injection 4 mg (4 mg Intravenous Given 02/27/23 1233)  sodium chloride 0.9 % bolus 500 mL (0 mLs Intravenous Stopped 02/27/23 1500)  iohexol (OMNIPAQUE) 350 MG/ML injection 75 mL (75 mLs Intravenous Contrast Given 02/27/23 1326)                                                                                                                                     Procedures Procedures  (including critical care time)  Medical Decision Making / ED Course    Medical Decision Making:    [name] is a 47 y.o. female with past medical history as below, significant for chronic back pain, COPD, GAD, MDD, polysubstance abuse, aortic regurg, IBS, chronic anemia who presents to the ED with complaint of chest pain, abdominal pain.. The complaint involves an extensive differential diagnosis and also carries with it a high risk of complications and morbidity.  Serious etiology was considered. Ddx includes but is not limited to: Differential diagnosis includes but is not exclusive to acute cholecystitis, intrathoracic causes for epigastric abdominal pain, gastritis, duodenitis, pancreatitis, small bowel or large bowel obstruction, abdominal aortic aneurysm, hernia, gastritis, etc. Differential includes all life-threatening causes for chest pain. This includes but is not exclusive to acute coronary syndrome, aortic dissection, pulmonary embolism, cardiac tamponade, community-acquired pneumonia, pericarditis, musculoskeletal chest wall pain, etc.   Complete initial physical exam performed, notably the patient  was sitting at the edge of the bed,  no resp distress.    Reviewed and confirmed nursing documentation for past medical history, family history, social history.  Vital signs reviewed.   patient is hemodynamically stable, she is breathing comfortably ambient air and ambulatory around the treatment area.   Pt with multiple complaints, her workup is  re-assuring. Labs stable, trop neg, symptoms do not appear c/w ACS. I went to go re-assess the pt and unable to find her. Spoke with nursing and they reported that she was in another area of the hospital talking with another staff member. Unable to locate the patient, spoke with multiple nursing staff, pt was seen leaving the ER. Pt unfortunately eloped prior to my re-check and dispo. Told by nursing that patient removed IV herself at bedside before leaving.            Additional history obtained: -Additional history obtained from na -External records from outside source obtained and reviewed including: Chart review including previous notes, labs, imaging, consultation notes including prior ED visits, prior admission,    Lab Tests: -I ordered, reviewed, and interpreted labs.   The pertinent results include:   Labs Reviewed  BASIC METABOLIC PANEL - Abnormal; Notable for the following components:      Result Value   BUN 5 (*)    Calcium 8.5 (*)    All other components within normal limits  CBC - Abnormal; Notable for the following components:   Hemoglobin 11.3 (*)    HCT 35.0 (*)    All other components within normal limits  HEPATIC FUNCTION PANEL - Abnormal; Notable for the following components:   Total Protein 6.4 (*)    Albumin 3.2 (*)    All other components within normal limits  BRAIN NATRIURETIC PEPTIDE  PROTIME-INR  I-STAT BETA HCG BLOOD, ED ([facility], [facility], [facility] ONLY)  TROPONIN I (HIGH SENSITIVITY)  TROPONIN I (HIGH SENSITIVITY)    Notable for as above  EKG   EKG Interpretation  Date/Time:    Ventricular Rate:    PR Interval:    QRS Duration:   QT Interval:    QTC Calculation:   R Axis:     Text Interpretation:           Imaging Studies ordered: I ordered imaging studies including CTPE CTAP CXR I independently visualized the following imaging with scope of interpretation limited to determining acute life threatening conditions related to emergency care; findings  noted above, significant for ?old infection, ?uti I independently visualized and interpreted imaging. I agree with the radiologist interpretation   Medicines ordered and prescription drug management: Meds ordered this encounter  Medications   HYDROmorphone (DILAUDID) injection 1 mg   ondansetron (ZOFRAN) injection 4 mg   sodium chloride 0.9 % bolus 500 mL   iohexol (OMNIPAQUE) 350 MG/ML injection 75 mL    -I have reviewed the patients home medicines and have made adjustments as needed   Consultations Obtained: na   Cardiac Monitoring: The patient was maintained on a cardiac monitor.  I personally viewed and interpreted the cardiac monitored which showed an underlying rhythm of: nsr  Social Determinants of Health:  Diagnosis or treatment significantly limited by social determinants of health: current smoker Counseled patient for approximately 2 minutes regarding smoking cessation. Discussed risks of smoking and how they applied and affected their visit here today. Patient not ready to quit at this time, however will follow up with their primary doctor when they are.   CPT code: 16109: intermediate counseling for smoking cessation  Reevaluation: After the interventions noted above, I reevaluated the patient and found that they have improved  Co morbidities that complicate the patient evaluation  Past Medical History:  Diagnosis Date   ADD (attention deficit disorder)    Chronic back pain    LOW BACK PAIN   Chronic back pain    COPD (chronic obstructive pulmonary disease) (HCC)    per pt last exacerbation w/ pneumonia 12/ 2021 , no covid,  stated no residual   Family history of adverse reaction to anesthesia    sister--- ponv/ migraines   Full dentures    GAD (generalized anxiety disorder)    Generalized headaches    MDD (major depressive disorder)    PONV (postoperative nausea and vomiting)    and migraines      Dispostion: Disposition decision including need  for hospitalization was considered, and patient Eloped    Final Clinical Impression(s) / ED Diagnoses Final diagnoses:  Chest pain, unspecified type  Edema, unspecified type     This chart was dictated using voice recognition software.  Despite best efforts to proofread,  errors can occur which can change the documentation meaning.    Sloan Leiter, DO 03/01/23 1951

## 2023-02-27 NOTE — ED Notes (Signed)
[facility] patient access let me know that this patient told her she felt like she was having a heart attack.  I went out to the [facility] to call patient back for triage but patient had already walked out of the clinic. Was then advised that EMS was on site  [place] RN  went out to speak to them but they would not give her any information about the patient sighting HIPAA.

## 2023-02-27 NOTE — ED Triage Notes (Addendum)
[facility] from [facility] parking lot for c/o non radiating midsternal chest pain and LUQ, RUQ abdominal pain and abdominal distentionx2d. PT states she gained 15 lbs in the past four days. Pt c/o nausea and SOB. Pt not in resp distress at this time. Pt abdomen is distended, but soft. Pt is restless due to pain. EMS gave zofran 4 mg IV.

## 2023-03-04 ENCOUNTER — Encounter: Payer: Self-pay | Admitting: Internal Medicine

## 2023-03-04 ENCOUNTER — Ambulatory Visit: Payer: Medicaid Other | Admitting: Internal Medicine

## 2023-03-04 ENCOUNTER — Other Ambulatory Visit: Payer: Self-pay | Admitting: Internal Medicine

## 2023-03-04 VITALS — BP 120/78 | HR 93 | Ht 68.0 in | Wt 162.0 lb

## 2023-03-04 DIAGNOSIS — J209 Acute bronchitis, unspecified: Secondary | ICD-10-CM

## 2023-03-04 DIAGNOSIS — F411 Generalized anxiety disorder: Secondary | ICD-10-CM | POA: Diagnosis not present

## 2023-03-04 DIAGNOSIS — N3000 Acute cystitis without hematuria: Secondary | ICD-10-CM | POA: Diagnosis not present

## 2023-03-04 DIAGNOSIS — R109 Unspecified abdominal pain: Secondary | ICD-10-CM

## 2023-03-04 LAB — POCT URINALYSIS DIPSTICK
Bilirubin, UA: NEGATIVE
Glucose, UA: NEGATIVE
Ketones, UA: NEGATIVE
Nitrite, UA: POSITIVE
Protein, UA: POSITIVE — AB
Spec Grav, UA: 1.02 (ref 1.010–1.025)
Urobilinogen, UA: 1 E.U./dL
pH, UA: 6 (ref 5.0–8.0)

## 2023-03-04 MED ORDER — SULFAMETHOXAZOLE-TRIMETHOPRIM 800-160 MG PO TABS
1.0000 | ORAL_TABLET | Freq: Two times a day (BID) | ORAL | 0 refills | Status: DC
Start: 2023-03-04 — End: 2023-03-25

## 2023-03-04 NOTE — Progress Notes (Signed)
Established Patient Office Visit  Subjective:  Patient ID: [name], female    DOB: 1976/02/10  Age: 47 y.o. MRN: [id number]  Chief Complaint  Patient presents with   Follow-up    UTI, flank pain, cough    Patient comes in with 1 week history of bladder area discomfort, dysuria,burning micturition,chills, and lower abdominal pain. Patient reports that they were at the beach last week.  And her symptoms started shortly after sexual intercourse.  She bought some over-the-counter bladder pain medications but did not feel better after taking it.  There is no vaginal itching or burning and no rash.   Patient also was having some chest congestion and a productive cough.  She smokes and uses vape. On 02/27/2023 she went to the emergency room where she had a CT scan of the chest and abdomen.  It was noted that her findings were suggestive of bronchial infection and cystitis.  However patient left without getting any medication as she got upset with them. Today her urine dipstick is nitrite positive and shows some packed cells. Will empirically start treatment with Bactrim DS.  Patient encouraged to drink plenty of fluid with her antibiotics. Patient also wanted to discuss her recent blood work.  She was informed of some of the abnormal labs.  However she will return for a more detailed discussion in 10 days.    No other concerns at this time.   Past Medical History:  Diagnosis Date   ADD (attention deficit disorder)    Chronic back pain    LOW BACK PAIN   Chronic back pain    COPD (chronic obstructive pulmonary disease) (HCC)    per pt last exacerbation w/ pneumonia 12/ 2021 , no covid,  stated no residual   Family history of adverse reaction to anesthesia    sister--- ponv/ migraines   Full dentures    GAD (generalized anxiety disorder)    Generalized headaches    MDD (major depressive disorder)    PONV (postoperative nausea and vomiting)    and migraines    Past Surgical  History:  Procedure Laterality Date   CARPAL TUNNEL RELEASE Right 10/31/2020   Procedure: CARPAL TUNNEL RELEASE;  Surgeon: Sheral Apley, MD;  Location: [facility];  Service: Orthopedics;  Laterality: Right;   COLONOSCOPY  11-15-2009   DR Arlyce Dice, CLEAR EXCEPT FOR SOME HEMORRHOIDS   HEMORRHOID SURGERY  02/28/2006   [facility], DR. Luisa Hart   KNEE ARTHROSCOPY W/ MENISCECTOMY Right 2005   LAPAROSCOPIC ASSISTED VAGINAL HYSTERECTOMY  02-27-2010  @  [facility]   WITH A&P REPAIR, DR. Jarold Song   RIGHT/LEFT HEART CATH AND CORONARY ANGIOGRAPHY N/A 09/21/2021   Procedure: RIGHT/LEFT HEART CATH AND CORONARY ANGIOGRAPHY;  Surgeon: Laurier Nancy, MD;  Location: [facility];  Service: Cardiovascular;  Laterality: N/A;   ULNAR TUNNEL RELEASE Right 10/31/2020   Procedure: CUBITAL TUNNEL RELEASE;  Surgeon: Sheral Apley, MD;  Location: [facility];  Service: Orthopedics;  Laterality: Right;    Social History   Socioeconomic History   Marital status: Divorced    Spouse name: Not on file   Number of children: Not on file   Years of education: Not on file   Highest education level: Not on file  Occupational History   Occupation: UNEMPLOYED  Tobacco Use   Smoking status: Every Day    Packs/day: 1.00    Years: 17.00    Additional pack years: 0.00    Total pack  years: 17.00    Types: Cigarettes   Smokeless tobacco: Never  Vaping Use   Vaping Use: Never used  Substance and Sexual Activity   Alcohol use: Not Currently    Comment: occasional   Drug use: Not Currently   Sexual activity: Yes    Partners: Male    Birth control/protection: Surgical  Other Topics Concern   Not on file  Social History Narrative   Daily Caffeine Use-3   Social Determinants of Health   Financial Resource Strain: Not on file  Food Insecurity: Not on file  Transportation Needs: Not on file  Physical Activity: Not on file  Stress: Not on file  Social Connections: Not on file   Intimate Partner Violence: Not on file    Family History  Problem Relation Age of Onset   Depression Mother    Colon polyps Mother    Heart disease Mother    Irritable bowel syndrome Mother    Bipolar disorder Sister     Allergies  Allergen Reactions   Omnicef [Cefdinir] Hives   Penicillins Swelling   Hydrocodone Nausea And Vomiting   Morphine Itching    Itching is a benign side effect   Nsaids Other (See Comments)    Patient states she has been told her not to take because of "bleeding ulcers"   Propoxyphene Other (See Comments)   Tramadol Nausea And Vomiting    Review of Systems  Constitutional:  Positive for chills and malaise/fatigue. Negative for diaphoresis, fever and weight loss.  HENT: Negative.    Eyes: Negative.   Respiratory:  Positive for cough, sputum production and wheezing. Negative for shortness of breath.   Cardiovascular:  Negative for chest pain, palpitations, leg swelling and PND.  Gastrointestinal:  Positive for abdominal pain. Negative for blood in stool, constipation, diarrhea, heartburn, melena and vomiting.  Genitourinary:  Positive for dysuria, frequency and urgency. Negative for flank pain and hematuria.  Musculoskeletal:  Positive for back pain and myalgias. Negative for falls, joint pain and neck pain.  Skin: Negative.   Neurological:  Negative for dizziness and loss of consciousness.  Psychiatric/Behavioral:  Positive for depression.        Objective:   BP 120/78   Pulse 93   Ht 5\' 8"  (1.727 m)   Wt 162 lb (73.5 kg)   SpO2 98%   BMI 24.63 kg/m   Vitals:   03/04/23 1136  BP: 120/78  Pulse: 93  Height: 5\' 8"  (1.727 m)  Weight: 162 lb (73.5 kg)  SpO2: 98%  BMI (Calculated): 24.64    Physical Exam Vitals and nursing note reviewed.  Constitutional:      General: She is not in acute distress.    Appearance: Normal appearance. She is not ill-appearing or toxic-appearing.  Cardiovascular:     Rate and Rhythm: Normal rate.      Pulses: Normal pulses.     Heart sounds: Normal heart sounds.  Pulmonary:     Effort: Pulmonary effort is normal. No respiratory distress.     Breath sounds: Normal breath sounds. No wheezing or rales.  Chest:     Chest wall: No tenderness.  Abdominal:     General: Bowel sounds are normal.     Palpations: Abdomen is soft.     Tenderness: There is abdominal tenderness (bladder area). There is no right CVA tenderness or left CVA tenderness.  Musculoskeletal:     Cervical back: Normal range of motion.  Neurological:     General: No focal  deficit present.     Mental Status: She is alert and oriented to person, place, and time.  Psychiatric:        Mood and Affect: Mood normal.        Behavior: Behavior normal.      Results for orders placed or performed in visit on 03/04/23  POCT Urinalysis Dipstick (81002)  Result Value Ref Range   Color, UA     Clarity, UA Cloudy    Glucose, UA Negative Negative   Bilirubin, UA Negative    Ketones, UA Negative    Spec Grav, UA 1.020 1.010 - 1.025   Blood, UA Moderate    pH, UA 6.0 5.0 - 8.0   Protein, UA Positive (A) Negative   Urobilinogen, UA 1.0 0.2 or 1.0 E.U./dL   Nitrite, UA Positive    Leukocytes, UA Trace (A) Negative   Appearance     Odor          Assessment & Plan:  Start p.o. Bactrim.  Will send urine for culture and sensitivity.  Encourage p.o. fluids.  Follow-up to repeat urine and to discuss labs in detail. Problem List Items Addressed This Visit     GAD (generalized anxiety disorder)   BRONCHITIS, ACUTE   UTI'S, CHRONIC - Primary   Relevant Medications   sulfamethoxazole-trimethoprim (BACTRIM DS) 800-160 MG tablet   Other Relevant Orders   Urine Culture   Acute flank pain   Relevant Orders   POCT Urinalysis Dipstick (54098) (Completed)    Return in about 10 days (around 03/14/2023).   Total time spent: 30 minutes  Margaretann Loveless, MD  03/04/2023   This document may have been prepared by [facility] Voice  Recognition software and as such may include unintentional dictation errors.

## 2023-03-08 LAB — URINE CULTURE

## 2023-03-14 ENCOUNTER — Other Ambulatory Visit: Payer: Self-pay | Admitting: Family

## 2023-03-14 ENCOUNTER — Encounter: Payer: Self-pay | Admitting: Family

## 2023-03-14 ENCOUNTER — Ambulatory Visit (INDEPENDENT_AMBULATORY_CARE_PROVIDER_SITE_OTHER): Payer: Medicaid Other | Admitting: Family

## 2023-03-14 ENCOUNTER — Ambulatory Visit: Payer: Medicaid Other | Admitting: Nurse Practitioner

## 2023-03-14 DIAGNOSIS — Z20822 Contact with and (suspected) exposure to covid-19: Secondary | ICD-10-CM | POA: Diagnosis not present

## 2023-03-14 DIAGNOSIS — R051 Acute cough: Secondary | ICD-10-CM | POA: Diagnosis not present

## 2023-03-14 DIAGNOSIS — R519 Headache, unspecified: Secondary | ICD-10-CM | POA: Diagnosis not present

## 2023-03-14 DIAGNOSIS — J069 Acute upper respiratory infection, unspecified: Secondary | ICD-10-CM

## 2023-03-14 LAB — POCT XPERT XPRESS SARS COVID-2/FLU/RSV
FLU A: NEGATIVE
FLU B: NEGATIVE
RSV RNA, PCR: NEGATIVE
SARS Coronavirus 2: NEGATIVE

## 2023-03-14 MED ORDER — DOXYCYCLINE HYCLATE 100 MG PO CAPS: 100.0000 mg | ORAL_CAPSULE | Freq: Two times a day (BID) | ORAL | 0 refills | Status: DC

## 2023-03-14 MED ORDER — FLUCONAZOLE 150 MG PO TABS: ORAL_TABLET | ORAL | 0 refills | Status: DC

## 2023-03-14 MED ORDER — HYDROCOD POLI-CHLORPHE POLI ER 10-8 MG/5ML PO SUER: 5.0000 mL | Freq: Two times a day (BID) | ORAL | 0 refills | Status: DC | PRN

## 2023-03-14 NOTE — Progress Notes (Signed)
CHIEF COMPLAINT  COVID Testing      REASON FOR VISIT  COVID Test Only     ASSESSMENT  Contact with and (suspected) exposure to other viral communicable disease     PLAN  COVID/FluA+B/RSV completed in the office today  COVID Negative Flu A  Negative Flu B Negative RSV Negative  

## 2023-03-24 ENCOUNTER — Other Ambulatory Visit: Payer: Self-pay | Admitting: Nurse Practitioner

## 2023-03-24 NOTE — Telephone Encounter (Signed)
Patient called stating that she's having episodes of low sugar, she has an appt next Tuesday but she needs to come  in a see another provider before then if her sugars are dropping that low, please call to schedule pt wth another provider

## 2023-03-25 ENCOUNTER — Ambulatory Visit: Payer: Medicaid Other | Admitting: Internal Medicine

## 2023-03-25 ENCOUNTER — Encounter: Payer: Self-pay | Admitting: Internal Medicine

## 2023-03-25 VITALS — BP 118/70 | HR 86 | Ht 68.0 in | Wt 164.0 lb

## 2023-03-25 DIAGNOSIS — G43009 Migraine without aura, not intractable, without status migrainosus: Secondary | ICD-10-CM

## 2023-03-25 DIAGNOSIS — R7303 Prediabetes: Secondary | ICD-10-CM

## 2023-03-25 DIAGNOSIS — K219 Gastro-esophageal reflux disease without esophagitis: Secondary | ICD-10-CM

## 2023-03-25 DIAGNOSIS — F411 Generalized anxiety disorder: Secondary | ICD-10-CM

## 2023-03-25 DIAGNOSIS — E782 Mixed hyperlipidemia: Secondary | ICD-10-CM

## 2023-03-25 DIAGNOSIS — F320 Major depressive disorder, single episode, mild: Secondary | ICD-10-CM

## 2023-03-25 LAB — POCT CBG (FASTING - GLUCOSE)-MANUAL ENTRY: Glucose Fasting, POC: 128 mg/dL — AB (ref 70–99)

## 2023-03-25 MED ORDER — BUSPIRONE HCL 15 MG PO TABS
15.0000 mg | ORAL_TABLET | Freq: Two times a day (BID) | ORAL | 6 refills | Status: DC
Start: 2023-03-25 — End: 2023-03-27

## 2023-03-25 NOTE — Progress Notes (Signed)
Established Patient Office Visit  Subjective:  Patient ID: [name], female    DOB: 08-30-1976  Age: 47 y.o. MRN: [id number]  Chief Complaint  Patient presents with   Follow-up    Follow up    Comes in for her follow-up today.  She has completed antibiotics for her urinary tract infection.  All those symptoms have resolved.  However she complains of feeling very tired, and anxiety is getting worse.  She has difficulty sleeping at night.  Agrees to see a psychiatrist so we will send a referral.  Also will restart BuSpar at 15 mg p.o. twice daily.  Next patient had labs done recently.  Results were discussed today.    No other concerns at this time.   Past Medical History:  Diagnosis Date   ADD (attention deficit disorder)    Chronic back pain    LOW BACK PAIN   Chronic back pain    COPD (chronic obstructive pulmonary disease) (HCC)    per pt last exacerbation w/ pneumonia 12/ 2021 , no covid,  stated no residual   Family history of adverse reaction to anesthesia    sister--- ponv/ migraines   Full dentures    GAD (generalized anxiety disorder)    Generalized headaches    MDD (major depressive disorder)    PONV (postoperative nausea and vomiting)    and migraines    Past Surgical History:  Procedure Laterality Date   CARPAL TUNNEL RELEASE Right 10/31/2020   Procedure: CARPAL TUNNEL RELEASE;  Surgeon: Sheral Apley, MD;  Location: [facility] [place];  Service: Orthopedics;  Laterality: Right;   COLONOSCOPY  11-15-2009   DR Arlyce Dice, CLEAR EXCEPT FOR SOME HEMORRHOIDS   HEMORRHOID SURGERY  02/28/2006   [facility], DR. Luisa Hart   KNEE ARTHROSCOPY W/ MENISCECTOMY Right 2005   LAPAROSCOPIC ASSISTED VAGINAL HYSTERECTOMY  02-27-2010  @  [facility]   WITH A&P REPAIR, DR. Jarold Song   RIGHT/LEFT HEART CATH AND CORONARY ANGIOGRAPHY N/A 09/21/2021   Procedure: RIGHT/LEFT HEART CATH AND CORONARY ANGIOGRAPHY;  Surgeon: Laurier Nancy, MD;  Location: [facility];  Service:  Cardiovascular;  Laterality: N/A;   ULNAR TUNNEL RELEASE Right 10/31/2020   Procedure: CUBITAL TUNNEL RELEASE;  Surgeon: Sheral Apley, MD;  Location: [facility];  Service: Orthopedics;  Laterality: Right;    Social History   Socioeconomic History   Marital status: Divorced    Spouse name: Not on file   Number of children: Not on file   Years of education: Not on file   Highest education level: Not on file  Occupational History   Occupation: UNEMPLOYED  Tobacco Use   Smoking status: Every Day    Packs/day: 1.00    Years: 17.00    Additional pack years: 0.00    Total pack years: 17.00    Types: Cigarettes   Smokeless tobacco: Never  Vaping Use   Vaping Use: Never used  Substance and Sexual Activity   Alcohol use: Not Currently    Comment: occasional   Drug use: Not Currently   Sexual activity: Yes    Partners: Male    Birth control/protection: Surgical  Other Topics Concern   Not on file  Social History Narrative   Daily Caffeine Use-3   Social Determinants of Health   Financial Resource Strain: Not on file  Food Insecurity: Not on file  Transportation Needs: Not on file  Physical Activity: Not on file  Stress: Not on file  Social  Connections: Not on file  Intimate Partner Violence: Not on file    Family History  Problem Relation Age of Onset   Depression Mother    Colon polyps Mother    Heart disease Mother    Irritable bowel syndrome Mother    Bipolar disorder Sister     Allergies  Allergen Reactions   Omnicef [Cefdinir] Hives   Penicillins Swelling   Hydrocodone Nausea And Vomiting   Morphine Itching    Itching is a benign side effect   Nsaids Other (See Comments)    Patient states she has been told her not to take because of "bleeding ulcers"   Propoxyphene Other (See Comments)   Tramadol Nausea And Vomiting    Review of Systems  Constitutional:  Positive for malaise/fatigue. Negative for chills, diaphoresis, fever and  weight loss.  HENT: Negative.    Eyes: Negative.   Respiratory:  Negative for cough, shortness of breath and wheezing.   Cardiovascular:  Negative for chest pain, palpitations, leg swelling and PND.  Gastrointestinal:  Negative for abdominal pain, blood in stool, heartburn, melena, nausea and vomiting.  Genitourinary: Negative.   Musculoskeletal:  Positive for myalgias. Negative for back pain, joint pain and neck pain.  Skin: Negative.   Neurological: Negative.   Psychiatric/Behavioral:  Positive for depression. Negative for hallucinations, memory loss, substance abuse and suicidal ideas. The patient is nervous/anxious and has insomnia.        Objective:   BP 118/70   Pulse 86   Ht 5\' 8"  (1.727 m)   Wt 164 lb (74.4 kg)   SpO2 97%   BMI 24.94 kg/m   Vitals:   03/25/23 1334  BP: 118/70  Pulse: 86  Height: 5\' 8"  (1.727 m)  Weight: 164 lb (74.4 kg)  SpO2: 97%  BMI (Calculated): 24.94    Physical Exam Vitals and nursing note reviewed.  Constitutional:      General: She is not in acute distress.    Appearance: Normal appearance.  HENT:     Head: Normocephalic and atraumatic.     Nose: Nose normal. No congestion or rhinorrhea.     Mouth/Throat:     Mouth: Mucous membranes are moist.     Pharynx: Oropharynx is clear. No oropharyngeal exudate or posterior oropharyngeal erythema.  Cardiovascular:     Rate and Rhythm: Normal rate and regular rhythm.     Pulses: Normal pulses.     Heart sounds: Normal heart sounds.  Pulmonary:     Effort: Pulmonary effort is normal.     Breath sounds: Normal breath sounds. No wheezing, rhonchi or rales.  Abdominal:     General: Bowel sounds are normal.     Palpations: Abdomen is soft.  Musculoskeletal:        General: Normal range of motion.     Cervical back: Normal range of motion and neck supple.     Right lower leg: No edema.     Left lower leg: No edema.  Neurological:     General: No focal deficit present.     Mental Status:  She is alert and oriented to person, place, and time.     Gait: Gait normal.     Deep Tendon Reflexes: Reflexes normal.  Psychiatric:        Mood and Affect: Mood normal.        Behavior: Behavior normal.      Results for orders placed or performed in visit on 03/25/23  POCT CBG (Fasting - Glucose)  Result Value Ref Range   Glucose Fasting, POC 128 (A) 70 - 99 mg/dL    Recent Results (from the past 2160 hour(s))  Lipase, blood     Status: None   Collection Time: 01/31/23 11:42 AM  Result Value Ref Range   Lipase 31 11 - 51 U/L    Comment: Performed at [facility], [address] [place], [place] [address]  Comprehensive metabolic panel     Status: Abnormal   Collection Time: 01/31/23 11:42 AM  Result Value Ref Range   Sodium 140 135 - 145 mmol/L   Potassium 4.0 3.5 - 5.1 mmol/L   Chloride 106 98 - 111 mmol/L   CO2 24 22 - 32 mmol/L   Glucose, Bld 138 (H) 70 - 99 mg/dL    Comment: Glucose reference range applies only to samples taken after fasting for at least 8 hours.   BUN 9 6 - 20 mg/dL   Creatinine, Ser 6.04 0.44 - 1.00 mg/dL   Calcium 9.2 8.9 - 54.0 mg/dL   Total Protein 7.7 6.5 - 8.1 g/dL   Albumin 3.8 3.5 - 5.0 g/dL   AST 23 15 - 41 U/L   ALT 13 0 - 44 U/L   Alkaline Phosphatase 67 38 - 126 U/L   Total Bilirubin 0.7 0.3 - 1.2 mg/dL   GFR, Estimated >98 >11 mL/min    Comment: (NOTE) Calculated using the CKD-EPI Creatinine Equation (2021)    Anion gap 10 5 - 15    Comment: Performed at [facility], [address] [place], [place] [address]  CBC     Status: Abnormal   Collection Time: 01/31/23 11:42 AM  Result Value Ref Range   WBC 6.8 4.0 - 10.5 K/uL   RBC 5.24 (H) 3.87 - 5.11 MIL/uL   Hemoglobin 14.9 12.0 - 15.0 g/dL   HCT 29.5 62.1 - 30.8 %   MCV 87.0 80.0 - 100.0 fL   MCH 28.4 26.0 - 34.0 pg   MCHC 32.7 30.0 - 36.0 g/dL   RDW 65.7 84.6 - 96.2 %   Platelets 235 150 - 400 K/uL   nRBC 0.0 0.0 - 0.2 %    Comment: Performed at  [facility], [address] [place], [place] [address]  Comprehensive metabolic panel     Status: Abnormal   Collection Time: 02/01/23  6:59 AM  Result Value Ref Range   Sodium 135 135 - 145 mmol/L   Potassium 3.7 3.5 - 5.1 mmol/L   Chloride 103 98 - 111 mmol/L   CO2 24 22 - 32 mmol/L   Glucose, Bld 132 (H) 70 - 99 mg/dL    Comment: Glucose reference range applies only to samples taken after fasting for at least 8 hours.   BUN 6 6 - 20 mg/dL   Creatinine, Ser 1.32 0.44 - 1.00 mg/dL   Calcium 8.8 (L) 8.9 - 10.3 mg/dL   Total Protein 6.8 6.5 - 8.1 g/dL   Albumin 3.5 3.5 - 5.0 g/dL   AST 29 15 - 41 U/L   ALT 11 0 - 44 U/L   Alkaline Phosphatase 62 38 - 126 U/L   Total Bilirubin 1.0 0.3 - 1.2 mg/dL   GFR, Estimated >44 >01 mL/min    Comment: (NOTE) Calculated using the CKD-EPI Creatinine Equation (2021)    Anion gap 8 5 - 15    Comment: Performed at [facility], [address], [place], [place] [address]  Lipase, blood  Status: None   Collection Time: 02/01/23  6:59 AM  Result Value Ref Range   Lipase 34 11 - 51 U/L    Comment: Performed at [facility], [address] [place], [place] [address]  Troponin I (High Sensitivity)     Status: None   Collection Time: 02/01/23  6:59 AM  Result Value Ref Range   Troponin I (High Sensitivity) 2 <18 ng/L    Comment: (NOTE) Elevated high sensitivity troponin I (hsTnI) values and significant  changes across serial measurements may suggest ACS but many other  chronic and acute conditions are known to elevate hsTnI results.  Refer to the "Links" section for chest pain algorithms and additional  guidance. Performed at [facility], [address] [place], [place] [address]   CBC with Differential     Status: None   Collection Time: 02/01/23  6:59 AM  Result Value Ref Range   WBC 6.6 4.0 - 10.5 K/uL   RBC 4.61 3.87 - 5.11 MIL/uL   Hemoglobin 13.2 12.0 - 15.0 g/dL   HCT 09.8 11.9 - 14.7 %    MCV 87.0 80.0 - 100.0 fL   MCH 28.6 26.0 - 34.0 pg   MCHC 32.9 30.0 - 36.0 g/dL   RDW 82.9 56.2 - 13.0 %   Platelets 188 150 - 400 K/uL   nRBC 0.0 0.0 - 0.2 %   Neutrophils Relative % 76 %   Neutro Abs 5.0 1.7 - 7.7 K/uL   Lymphocytes Relative 18 %   Lymphs Abs 1.2 0.7 - 4.0 K/uL   Monocytes Relative 5 %   Monocytes Absolute 0.4 0.1 - 1.0 K/uL   Eosinophils Relative 1 %   Eosinophils Absolute 0.1 0.0 - 0.5 K/uL   Basophils Relative 0 %   Basophils Absolute 0.0 0.0 - 0.1 K/uL   Immature Granulocytes 0 %   Abs Immature Granulocytes 0.02 0.00 - 0.07 K/uL    Comment: Performed at [facility], [address] [place], [place] [address]  Ethanol     Status: None   Collection Time: 02/01/23  8:31 AM  Result Value Ref Range   Alcohol, Ethyl (B) <10 <10 mg/dL    Comment: (NOTE) Lowest detectable limit for serum alcohol is 10 mg/dL.  For medical purposes only. Performed at [facility], [address] [place], [place] [address]   D-dimer, quantitative     Status: Abnormal   Collection Time: 02/01/23  8:31 AM  Result Value Ref Range   D-Dimer, Quant 0.61 (H) 0.00 - 0.50 ug/mL-FEU    Comment: (NOTE) At the manufacturer cut-off value of 0.5 g/mL FEU, this assay has a negative predictive value of 95-100%.This assay is intended for use in conjunction with a clinical pretest probability (PTP) assessment model to exclude pulmonary embolism (PE) and deep venous thrombosis (DVT) in outpatients suspected of PE or DVT. Results should be correlated with clinical presentation. Performed at [facility], [address] [place], [place] [address]   Troponin I (High Sensitivity)     Status: None   Collection Time: 02/01/23  9:22 AM  Result Value Ref Range   Troponin I (High Sensitivity) 3 <18 ng/L    Comment: (NOTE) Elevated high sensitivity troponin I (hsTnI) values and significant  changes across serial measurements may suggest ACS but many other  chronic  and acute conditions are known to elevate hsTnI results.  Refer to the "Links" section for chest pain algorithms and additional  guidance. Performed  at [facility], [address] [place], [place] [address]   Hemoglobin A1c     Status: None   Collection Time: 02/26/23 10:47 AM  Result Value Ref Range   Hgb A1c MFr Bld 5.6 4.8 - 5.6 %    Comment:          Prediabetes: 5.7 - 6.4          Diabetes: >6.4          Glycemic control for adults with diabetes: <7.0    Est. average glucose Bld gHb Est-mCnc 114 mg/dL  TSH     Status: None   Collection Time: 02/26/23 10:47 AM  Result Value Ref Range   TSH 3.850 0.450 - 4.500 uIU/mL  CMP14+EGFR     Status: Abnormal   Collection Time: 02/26/23 10:47 AM  Result Value Ref Range   Glucose 95 70 - 99 mg/dL   BUN 12 6 - 24 mg/dL   Creatinine, Ser 1.91 (L) 0.57 - 1.00 mg/dL   eGFR 478 >29 FA/OZH/0.86   BUN/Creatinine Ratio 22 9 - 23   Sodium 140 134 - 144 mmol/L   Potassium 3.7 3.5 - 5.2 mmol/L   Chloride 99 96 - 106 mmol/L   CO2 30 (H) 20 - 29 mmol/L   Calcium 8.4 (L) 8.7 - 10.2 mg/dL   Total Protein 6.2 6.0 - 8.5 g/dL   Albumin 4.0 3.9 - 4.9 g/dL   Globulin, Total 2.2 1.5 - 4.5 g/dL   Albumin/Globulin Ratio 1.8 1.2 - 2.2   Bilirubin Total 0.2 0.0 - 1.2 mg/dL   Alkaline Phosphatase 75 44 - 121 IU/L   AST 26 0 - 40 IU/L   ALT 26 0 - 32 IU/L  Lipid panel     Status: Abnormal   Collection Time: 02/26/23 10:47 AM  Result Value Ref Range   Cholesterol, Total 159 100 - 199 mg/dL   Triglycerides 578 (H) 0 - 149 mg/dL   HDL 46 >46 mg/dL   VLDL Cholesterol Cal 35 5 - 40 mg/dL   LDL Chol Calc (NIH) 78 0 - 99 mg/dL   Chol/HDL Ratio 3.5 0.0 - 4.4 ratio    Comment:                                   T. Chol/HDL Ratio                                             Men  Women                               1/2 Avg.Risk  3.4    3.3                                   Avg.Risk  5.0    4.4                                2X Avg.Risk  9.6    7.1  3X Avg.Risk 23.4   11.0   CBC With Differential     Status: None   Collection Time: 02/26/23 10:47 AM  Result Value Ref Range   WBC 8.6 3.4 - 10.8 x10E3/uL   RBC 3.96 3.77 - 5.28 x10E6/uL   Hemoglobin 11.6 11.1 - 15.9 g/dL   Hematocrit 16.1 09.6 - 46.6 %   MCV 86 79 - 97 fL   MCH 29.3 26.6 - 33.0 pg   MCHC 34.0 31.5 - 35.7 g/dL   RDW 04.5 40.9 - 81.1 %   Neutrophils 67 Not Estab. %   Lymphs 23 Not Estab. %   Monocytes 7 Not Estab. %   Eos 3 Not Estab. %   Basos 0 Not Estab. %   Neutrophils Absolute 5.7 1.4 - 7.0 x10E3/uL   Lymphocytes Absolute 2.0 0.7 - 3.1 x10E3/uL   Monocytes Absolute 0.6 0.1 - 0.9 x10E3/uL   EOS (ABSOLUTE) 0.3 0.0 - 0.4 x10E3/uL   Basophils Absolute 0.0 0.0 - 0.2 x10E3/uL   Immature Granulocytes 0 Not Estab. %   Immature Grans (Abs) 0.0 0.0 - 0.1 x10E3/uL  Basic metabolic panel     Status: Abnormal   Collection Time: 02/27/23 10:30 AM  Result Value Ref Range   Sodium 137 135 - 145 mmol/L   Potassium 3.7 3.5 - 5.1 mmol/L   Chloride 99 98 - 111 mmol/L   CO2 31 22 - 32 mmol/L   Glucose, Bld 98 70 - 99 mg/dL    Comment: Glucose reference range applies only to samples taken after fasting for at least 8 hours.   BUN 5 (L) 6 - 20 mg/dL   Creatinine, Ser 9.14 0.44 - 1.00 mg/dL   Calcium 8.5 (L) 8.9 - 10.3 mg/dL   GFR, Estimated >78 >29 mL/min    Comment: (NOTE) Calculated using the CKD-EPI Creatinine Equation (2021)    Anion gap 7 5 - 15    Comment: Performed at [facility], [address], [place], [place] [address]  CBC     Status: Abnormal   Collection Time: 02/27/23 10:30 AM  Result Value Ref Range   WBC 9.2 4.0 - 10.5 K/uL   RBC 3.96 3.87 - 5.11 MIL/uL   Hemoglobin 11.3 (L) 12.0 - 15.0 g/dL   HCT 08.6 (L) 57.8 - 46.9 %   MCV 88.4 80.0 - 100.0 fL   MCH 28.5 26.0 - 34.0 pg   MCHC 32.3 30.0 - 36.0 g/dL   RDW 62.9 52.8 - 41.3 %   Platelets 188 150 - 400 K/uL   nRBC 0.0 0.0 - 0.2 %    Comment: Performed at [facility], [address] [place], [place] [address]  Troponin I (High Sensitivity)     Status: None   Collection Time: 02/27/23 10:30 AM  Result Value Ref Range   Troponin I (High Sensitivity) 3 <18 ng/L    Comment: (NOTE) Elevated high sensitivity troponin I (hsTnI) values and significant  changes across serial measurements may suggest ACS but many other  chronic and acute conditions are known to elevate hsTnI results.  Refer to the "Links" section for chest pain algorithms and additional  guidance. Performed at [facility], [address], [place], [place] [address]   I-Stat beta hCG blood, ED ([facility], [facility], [facility] only)     Status: None   Collection Time: 02/27/23 10:36 AM  Result Value Ref Range   I-stat hCG, quantitative <5.0 <5 mIU/mL   Comment  3            Comment:   GEST. AGE      CONC.  (mIU/mL)   <=1 WEEK        5 - 50     2 WEEKS       50 - 500     3 WEEKS       100 - 10,000     4 WEEKS     1,000 - 30,000        FEMALE AND NON-PREGNANT FEMALE:     LESS THAN 5 mIU/mL   Brain natriuretic peptide     Status: None   Collection Time: 02/27/23 12:21 PM  Result Value Ref Range   B Natriuretic Peptide 91.1 0.0 - 100.0 pg/mL    Comment: Performed at [facility], [address], [place], [place] [address]  Troponin I (High Sensitivity)     Status: None   Collection Time: 02/27/23 12:30 PM  Result Value Ref Range   Troponin I (High Sensitivity) 4 <18 ng/L    Comment: (NOTE) Elevated high sensitivity troponin I (hsTnI) values and significant  changes across serial measurements may suggest ACS but many other  chronic and acute conditions are known to elevate hsTnI results.  Refer to the "Links" section for chest pain algorithms and additional  guidance. Performed at [facility], [address], [place], [place] [address]   Hepatic function panel     Status: Abnormal   Collection Time: 02/27/23 12:30 PM  Result Value Ref Range   Total Protein 6.4 (L) 6.5 -  8.1 g/dL   Albumin 3.2 (L) 3.5 - 5.0 g/dL   AST 22 15 - 41 U/L   ALT 26 0 - 44 U/L   Alkaline Phosphatase 60 38 - 126 U/L   Total Bilirubin 0.8 0.3 - 1.2 mg/dL   Bilirubin, Direct <8.2 0.0 - 0.2 mg/dL   Indirect Bilirubin NOT CALCULATED 0.3 - 0.9 mg/dL    Comment: Performed at [facility], [address] [place], [place] [address]  Protime-INR     Status: None   Collection Time: 02/27/23 12:30 PM  Result Value Ref Range   Prothrombin Time 12.3 11.4 - 15.2 seconds   INR 0.9 0.8 - 1.2    Comment: (NOTE) INR goal varies based on device and disease states. Performed at [facility], [address], [place], [place] [address]   POCT Urinalysis Dipstick [id number])     Status: Abnormal   Collection Time: 03/04/23 11:42 AM  Result Value Ref Range   Color, UA     Clarity, UA Cloudy    Glucose, UA Negative Negative   Bilirubin, UA Negative    Ketones, UA Negative    Spec Grav, UA 1.020 1.010 - 1.025   Blood, UA Moderate    pH, UA 6.0 5.0 - 8.0   Protein, UA Positive (A) Negative    Comment: >=300mg /dl   Urobilinogen, UA 1.0 0.2 or 1.0 E.U./dL   Nitrite, UA Positive    Leukocytes, UA Trace (A) Negative   Appearance     Odor    Urine Culture     Status: Abnormal   Collection Time: 03/04/23 12:24 PM   UC  Result Value Ref Range   Urine Culture, Routine Final report (A)    Organism ID, Bacteria Escherichia coli (A)     Comment: Cefazolin <=4 ug/mL Cefazolin with an MIC <=16 predicts susceptibility to  the oral agents cefaclor, cefdinir, cefpodoxime, cefprozil, cefuroxime, cephalexin, and loracarbef when used for therapy of uncomplicated urinary tract infections due to E. coli, Klebsiella pneumoniae, and Proteus mirabilis. Greater than 100,000 colony forming units per mL    ORGANISM ID, BACTERIA Comment     Comment: Mixed urogenital flora 25,000-50,000 colony forming units per mL    Antimicrobial Susceptibility Comment     Comment:       ** S = Susceptible; I =  Intermediate; R = Resistant **                    P = Positive; N = Negative             MICS are expressed in micrograms per mL    Antibiotic                 RSLT#1    RSLT#2    RSLT#3    RSLT#4 Amoxicillin/Clavulanic Acid    S Ampicillin                     S Cefepime                       S Ceftriaxone                    S Cefuroxime                     S Ciprofloxacin                  S Ertapenem                      S Gentamicin                     S Imipenem                       S Levofloxacin                   S Meropenem                      S Nitrofurantoin                 S Piperacillin/Tazobactam        S Tetracycline                   S Tobramycin                     S Trimethoprim/Sulfa             S   POCT XPERT XPRESS SARS COVID-2/FLU/RSV [[id number]]     Status: Normal   Collection Time: 03/14/23  3:27 PM  Result Value Ref Range   SARS Coronavirus 2 Negative    FLU A Negative    FLU B Negative    RSV RNA, PCR Negative   POCT CBG (Fasting - Glucose)     Status: Abnormal   Collection Time: 03/25/23  1:43 PM  Result Value Ref Range   Glucose Fasting, POC 128 (A) 70 - 99 mg/dL    Comment: non-fasting      Assessment & Plan:  Add BuSpar, send referral to psychiatrist.  Continue other medications Problem List Items Addressed This Visit     GAD (generalized anxiety disorder) -  Primary   Relevant Medications   busPIRone (BUSPAR) 15 MG tablet   Other Relevant Orders   Ambulatory referral to Psychiatry   Current mild episode of major depressive disorder (HCC)   Relevant Medications   busPIRone (BUSPAR) 15 MG tablet   Prediabetes   Relevant Orders   POCT CBG (Fasting - Glucose) (Completed)   Gastroesophageal reflux disease without esophagitis   Mixed hyperlipidemia   Migraine without aura and without status migrainosus, not intractable    Return in about 1 month (around 04/24/2023).   Total time spent: 30 minutes  Margaretann Loveless,  MD  03/25/2023   This document may have been prepared by [facility] Voice Recognition software and as such may include unintentional dictation errors.

## 2023-03-26 ENCOUNTER — Other Ambulatory Visit: Payer: Self-pay | Admitting: Internal Medicine

## 2023-03-26 DIAGNOSIS — F411 Generalized anxiety disorder: Secondary | ICD-10-CM

## 2023-04-01 ENCOUNTER — Ambulatory Visit: Payer: Medicaid Other | Admitting: Nurse Practitioner

## 2023-04-24 ENCOUNTER — Ambulatory Visit: Payer: Medicaid Other | Admitting: Cardiology

## 2023-05-23 ENCOUNTER — Telehealth: Payer: Self-pay

## 2023-05-26 NOTE — Telephone Encounter (Signed)
Please forward this to [facility]

## 2023-06-11 ENCOUNTER — Ambulatory Visit (HOSPITAL_COMMUNITY): Payer: Medicaid Other | Admitting: Student

## 2023-12-13 DEATH — deceased
